# Patient Record
Sex: Female | Born: 1960 | Race: White | Hispanic: No | Marital: Married | Smoking: Never smoker
Health system: Southern US, Community
[De-identification: ages and names within clinical notes are randomized; demographics above are authoritative.]

## PROBLEM LIST (undated history)

## (undated) DIAGNOSIS — M1990 Unspecified osteoarthritis, unspecified site: Secondary | ICD-10-CM

## (undated) DIAGNOSIS — R7303 Prediabetes: Secondary | ICD-10-CM

## (undated) DIAGNOSIS — D649 Anemia, unspecified: Secondary | ICD-10-CM

## (undated) DIAGNOSIS — F329 Major depressive disorder, single episode, unspecified: Secondary | ICD-10-CM

## (undated) DIAGNOSIS — F32A Depression, unspecified: Secondary | ICD-10-CM

## (undated) DIAGNOSIS — T7840XA Allergy, unspecified, initial encounter: Secondary | ICD-10-CM

## (undated) DIAGNOSIS — R609 Edema, unspecified: Secondary | ICD-10-CM

## (undated) DIAGNOSIS — E119 Type 2 diabetes mellitus without complications: Secondary | ICD-10-CM

## (undated) DIAGNOSIS — F419 Anxiety disorder, unspecified: Secondary | ICD-10-CM

## (undated) DIAGNOSIS — K219 Gastro-esophageal reflux disease without esophagitis: Secondary | ICD-10-CM

## (undated) HISTORY — DX: Type 2 diabetes mellitus without complications: E11.9

## (undated) HISTORY — PX: TONSILLECTOMY AND ADENOIDECTOMY: SHX28

## (undated) HISTORY — DX: Prediabetes: R73.03

## (undated) HISTORY — DX: Major depressive disorder, single episode, unspecified: F32.9

## (undated) HISTORY — DX: Anxiety disorder, unspecified: F41.9

## (undated) HISTORY — DX: Allergy, unspecified, initial encounter: T78.40XA

## (undated) HISTORY — DX: Depression, unspecified: F32.A

## (undated) HISTORY — DX: Anemia, unspecified: D64.9

## (undated) HISTORY — PX: EYE SURGERY: SHX253

## (undated) HISTORY — DX: Unspecified osteoarthritis, unspecified site: M19.90

## (undated) HISTORY — DX: Edema, unspecified: R60.9

---

## 1995-08-14 DIAGNOSIS — K219 Gastro-esophageal reflux disease without esophagitis: Secondary | ICD-10-CM | POA: Insufficient documentation

## 1995-08-24 DIAGNOSIS — K589 Irritable bowel syndrome without diarrhea: Secondary | ICD-10-CM | POA: Insufficient documentation

## 1997-08-01 DIAGNOSIS — F341 Dysthymic disorder: Secondary | ICD-10-CM | POA: Insufficient documentation

## 1998-03-01 DIAGNOSIS — E66811 Obesity, class 1: Secondary | ICD-10-CM | POA: Insufficient documentation

## 1998-03-01 DIAGNOSIS — E669 Obesity, unspecified: Secondary | ICD-10-CM | POA: Insufficient documentation

## 2000-11-10 ENCOUNTER — Other Ambulatory Visit: Admission: RE | Admit: 2000-11-10 | Discharge: 2000-11-10 | Payer: Self-pay | Admitting: Family Medicine

## 2003-07-31 DIAGNOSIS — R7309 Other abnormal glucose: Secondary | ICD-10-CM | POA: Insufficient documentation

## 2004-07-06 ENCOUNTER — Ambulatory Visit: Payer: Self-pay | Admitting: Family Medicine

## 2004-10-05 ENCOUNTER — Ambulatory Visit: Payer: Self-pay | Admitting: Family Medicine

## 2004-10-20 ENCOUNTER — Ambulatory Visit: Payer: Self-pay | Admitting: Obstetrics and Gynecology

## 2004-10-27 ENCOUNTER — Ambulatory Visit: Payer: Self-pay | Admitting: Obstetrics and Gynecology

## 2004-10-30 ENCOUNTER — Ambulatory Visit: Payer: Self-pay | Admitting: Family Medicine

## 2004-11-03 ENCOUNTER — Ambulatory Visit: Payer: Self-pay | Admitting: Obstetrics and Gynecology

## 2004-11-29 ENCOUNTER — Ambulatory Visit: Payer: Self-pay | Admitting: Family Medicine

## 2004-12-30 ENCOUNTER — Ambulatory Visit: Payer: Self-pay | Admitting: Family Medicine

## 2005-01-29 ENCOUNTER — Ambulatory Visit: Payer: Self-pay | Admitting: Family Medicine

## 2005-03-01 ENCOUNTER — Ambulatory Visit: Payer: Self-pay | Admitting: Family Medicine

## 2005-04-01 ENCOUNTER — Ambulatory Visit: Payer: Self-pay | Admitting: Family Medicine

## 2005-04-29 ENCOUNTER — Ambulatory Visit: Payer: Self-pay | Admitting: Family Medicine

## 2005-05-06 ENCOUNTER — Encounter: Payer: Self-pay | Admitting: Otolaryngology

## 2005-05-30 ENCOUNTER — Ambulatory Visit: Payer: Self-pay | Admitting: Family Medicine

## 2005-05-30 ENCOUNTER — Encounter: Payer: Self-pay | Admitting: Otolaryngology

## 2005-06-29 ENCOUNTER — Ambulatory Visit: Payer: Self-pay | Admitting: Family Medicine

## 2005-12-15 ENCOUNTER — Encounter: Payer: Self-pay | Admitting: Family Medicine

## 2005-12-30 ENCOUNTER — Encounter: Payer: Self-pay | Admitting: Family Medicine

## 2006-08-01 ENCOUNTER — Ambulatory Visit: Payer: Self-pay | Admitting: Family Medicine

## 2006-12-12 ENCOUNTER — Ambulatory Visit: Payer: Self-pay | Admitting: General Practice

## 2007-03-02 HISTORY — PX: DILATION AND CURETTAGE OF UTERUS: SHX78

## 2007-03-02 HISTORY — PX: CHOLECYSTECTOMY: SHX55

## 2007-09-11 ENCOUNTER — Ambulatory Visit: Payer: Self-pay | Admitting: Gastroenterology

## 2007-09-14 ENCOUNTER — Ambulatory Visit: Payer: Self-pay | Admitting: Gastroenterology

## 2007-09-18 ENCOUNTER — Ambulatory Visit: Payer: Self-pay | Admitting: Gastroenterology

## 2007-09-20 DIAGNOSIS — Z9049 Acquired absence of other specified parts of digestive tract: Secondary | ICD-10-CM | POA: Insufficient documentation

## 2007-09-21 ENCOUNTER — Ambulatory Visit: Payer: Self-pay | Admitting: Surgery

## 2007-09-22 ENCOUNTER — Ambulatory Visit: Payer: Self-pay | Admitting: Surgery

## 2008-07-30 ENCOUNTER — Ambulatory Visit: Payer: Self-pay

## 2008-07-31 ENCOUNTER — Other Ambulatory Visit: Payer: Self-pay

## 2008-08-07 ENCOUNTER — Ambulatory Visit: Payer: Self-pay | Admitting: Family Medicine

## 2008-08-29 DIAGNOSIS — D509 Iron deficiency anemia, unspecified: Secondary | ICD-10-CM | POA: Insufficient documentation

## 2008-09-19 ENCOUNTER — Ambulatory Visit: Payer: Self-pay

## 2008-09-26 ENCOUNTER — Ambulatory Visit: Payer: Self-pay

## 2008-10-11 ENCOUNTER — Ambulatory Visit: Payer: Self-pay | Admitting: Gastroenterology

## 2008-10-11 LAB — HM COLONOSCOPY: HM COLON: NORMAL

## 2009-11-18 ENCOUNTER — Other Ambulatory Visit: Payer: Self-pay | Admitting: Physician Assistant

## 2009-12-31 ENCOUNTER — Ambulatory Visit: Payer: Self-pay

## 2010-02-09 ENCOUNTER — Other Ambulatory Visit: Payer: Self-pay | Admitting: Family Medicine

## 2010-09-23 ENCOUNTER — Ambulatory Visit: Payer: Self-pay | Admitting: Family Medicine

## 2010-10-23 ENCOUNTER — Ambulatory Visit: Payer: Self-pay | Admitting: Family Medicine

## 2010-12-14 ENCOUNTER — Ambulatory Visit: Payer: Self-pay | Admitting: General Practice

## 2011-01-04 ENCOUNTER — Ambulatory Visit: Payer: Self-pay | Admitting: General Practice

## 2011-01-12 ENCOUNTER — Emergency Department: Payer: Self-pay | Admitting: Emergency Medicine

## 2011-04-02 ENCOUNTER — Other Ambulatory Visit: Payer: Self-pay | Admitting: Family Medicine

## 2011-04-02 LAB — LIPID PANEL
Cholesterol: 177 mg/dL (ref 0–200)
HDL Cholesterol: 47 mg/dL (ref 40–60)
Ldl Cholesterol, Calc: 99 mg/dL (ref 0–100)
VLDL Cholesterol, Calc: 31 mg/dL (ref 5–40)

## 2011-04-02 LAB — COMPREHENSIVE METABOLIC PANEL
Albumin: 3.5 g/dL (ref 3.4–5.0)
Alkaline Phosphatase: 63 U/L (ref 50–136)
Anion Gap: 11 (ref 7–16)
BUN: 17 mg/dL (ref 7–18)
Calcium, Total: 8.6 mg/dL (ref 8.5–10.1)
Co2: 27 mmol/L (ref 21–32)
Creatinine: 0.61 mg/dL (ref 0.60–1.30)
EGFR (African American): >60
EGFR (Non-African Amer.): >60
Glucose: 115 mg/dL — ABNORMAL HIGH (ref 65–99)
Osmolality: 284 (ref 275–301)
Potassium: 4.1 mmol/L (ref 3.5–5.1)
SGOT(AST): 19 U/L (ref 15–37)
SGPT (ALT): 31 U/L
Sodium: 141 mmol/L (ref 136–145)

## 2011-04-02 LAB — CBC WITH DIFFERENTIAL/PLATELET
Basophil %: 0.6 %
Eosinophil %: 1.8 %
HGB: 11.3 g/dL — ABNORMAL LOW (ref 12.0–16.0)
Lymphocyte %: 17.5 %
MCHC: 33 g/dL (ref 32.0–36.0)
Monocyte %: 7.2 %
Neutrophil %: 72.9 %
Platelet: 237 10*3/uL (ref 150–440)
RBC: 4.22 10*6/uL (ref 3.80–5.20)
RDW: 14.6 % — ABNORMAL HIGH (ref 11.5–14.5)
WBC: 5.9 10*3/uL (ref 3.6–11.0)

## 2011-04-09 ENCOUNTER — Ambulatory Visit: Payer: Self-pay | Admitting: Cardiology

## 2011-11-12 ENCOUNTER — Ambulatory Visit: Payer: Self-pay | Admitting: Family Medicine

## 2012-08-03 ENCOUNTER — Other Ambulatory Visit: Payer: Self-pay

## 2012-08-03 LAB — LIPID PANEL
Cholesterol: 168 mg/dL (ref 0–200)
HDL Cholesterol: 47 mg/dL (ref 40–60)
Ldl Cholesterol, Calc: 91 mg/dL (ref 0–100)
Triglycerides: 151 mg/dL (ref 0–200)
VLDL Cholesterol, Calc: 30 mg/dL (ref 5–40)

## 2012-08-03 LAB — CBC WITH DIFFERENTIAL/PLATELET
Basophil #: 0.1 10*3/uL (ref 0.0–0.1)
Eosinophil #: 0.1 10*3/uL (ref 0.0–0.7)
Eosinophil %: 1.8 %
HCT: 39.1 % (ref 35.0–47.0)
HGB: 13.2 g/dL (ref 12.0–16.0)
Lymphocyte #: 1.4 10*3/uL (ref 1.0–3.6)
MCH: 27.7 pg (ref 26.0–34.0)
MCHC: 33.8 g/dL (ref 32.0–36.0)
Monocyte #: 0.5 x10 3/mm (ref 0.2–0.9)
Neutrophil #: 5 10*3/uL (ref 1.4–6.5)
Neutrophil %: 70.2 %
Platelet: 272 10*3/uL (ref 150–440)
RDW: 17.2 % — ABNORMAL HIGH (ref 11.5–14.5)
WBC: 7.1 10*3/uL (ref 3.6–11.0)

## 2012-08-03 LAB — COMPREHENSIVE METABOLIC PANEL
Albumin: 3.6 g/dL (ref 3.4–5.0)
Anion Gap: 6 — ABNORMAL LOW (ref 7–16)
Bilirubin,Total: 0.4 mg/dL (ref 0.2–1.0)
Calcium, Total: 9.3 mg/dL (ref 8.5–10.1)
Chloride: 101 mmol/L (ref 98–107)
Co2: 29 mmol/L (ref 21–32)
EGFR (African American): >60
EGFR (Non-African Amer.): >60
Osmolality: 273 (ref 275–301)
Potassium: 3.8 mmol/L (ref 3.5–5.1)
SGOT(AST): 23 U/L (ref 15–37)
Sodium: 136 mmol/L (ref 136–145)
Total Protein: 7.1 g/dL (ref 6.4–8.2)

## 2012-08-03 LAB — HEMOGLOBIN A1C: Hemoglobin A1C: 6.8 % — ABNORMAL HIGH (ref 4.2–6.3)

## 2012-09-19 ENCOUNTER — Other Ambulatory Visit: Payer: Self-pay

## 2012-10-02 ENCOUNTER — Other Ambulatory Visit: Payer: Self-pay | Admitting: Family Medicine

## 2012-10-02 LAB — RENAL FUNCTION PANEL
Anion Gap: 1 — ABNORMAL LOW (ref 7–16)
Calcium, Total: 8.4 mg/dL — ABNORMAL LOW (ref 8.5–10.1)
Chloride: 107 mmol/L (ref 98–107)
Co2: 31 mmol/L (ref 21–32)
Creatinine: 0.63 mg/dL (ref 0.60–1.30)
Glucose: 118 mg/dL — ABNORMAL HIGH (ref 65–99)
Phosphorus: 2.6 mg/dL (ref 2.5–4.9)
Sodium: 139 mmol/L (ref 136–145)

## 2013-07-12 LAB — HM PAP SMEAR: HM Pap smear: NORMAL

## 2013-07-12 LAB — FECAL OCCULT BLOOD, GUAIAC: Fecal Occult Blood: NEGATIVE

## 2013-07-13 ENCOUNTER — Other Ambulatory Visit: Payer: Self-pay | Admitting: Family Medicine

## 2013-07-13 LAB — CBC WITH DIFFERENTIAL/PLATELET
Basophil #: 0.1 10*3/uL (ref 0.0–0.1)
Basophil %: 1 %
Eosinophil #: 0.1 10*3/uL (ref 0.0–0.7)
Eosinophil %: 2.4 %
HCT: 42.5 % (ref 35.0–47.0)
HGB: 14.3 g/dL (ref 12.0–16.0)
Lymphocyte #: 1.4 10*3/uL (ref 1.0–3.6)
Lymphocyte %: 24.4 %
MCH: 29.8 pg (ref 26.0–34.0)
MCHC: 33.6 g/dL (ref 32.0–36.0)
MCV: 89 fL (ref 80–100)
MONO ABS: 0.4 x10 3/mm (ref 0.2–0.9)
Monocyte %: 8.1 %
Neutrophil #: 3.6 10*3/uL (ref 1.4–6.5)
Neutrophil %: 64.1 %
Platelet: 257 10*3/uL (ref 150–440)
RBC: 4.8 10*6/uL (ref 3.80–5.20)
RDW: 14.2 % (ref 11.5–14.5)
WBC: 5.6 10*3/uL (ref 3.6–11.0)

## 2013-07-13 LAB — COMPREHENSIVE METABOLIC PANEL
ALBUMIN: 3.8 g/dL (ref 3.4–5.0)
ANION GAP: 5 — AB (ref 7–16)
AST: 22 U/L (ref 15–37)
Alkaline Phosphatase: 96 U/L
BILIRUBIN TOTAL: 0.4 mg/dL (ref 0.2–1.0)
BUN: 17 mg/dL (ref 7–18)
CREATININE: 0.63 mg/dL (ref 0.60–1.30)
Calcium, Total: 9 mg/dL (ref 8.5–10.1)
Chloride: 101 mmol/L (ref 98–107)
Co2: 31 mmol/L (ref 21–32)
EGFR (Non-African Amer.): >60
GLUCOSE: 143 mg/dL — AB (ref 65–99)
Osmolality: 278 (ref 275–301)
Potassium: 4.1 mmol/L (ref 3.5–5.1)
SGPT (ALT): 42 U/L (ref 12–78)
Sodium: 137 mmol/L (ref 136–145)
Total Protein: 7.4 g/dL (ref 6.4–8.2)

## 2013-07-13 LAB — TSH: Thyroid Stimulating Horm: 1.97 u[IU]/mL

## 2013-07-13 LAB — LIPID PANEL
Cholesterol: 199 mg/dL (ref 0–200)
HDL: 43 mg/dL (ref 40–60)
Ldl Cholesterol, Calc: 114 mg/dL — ABNORMAL HIGH (ref 0–100)
Triglycerides: 210 mg/dL — ABNORMAL HIGH (ref 0–200)
VLDL Cholesterol, Calc: 42 mg/dL — ABNORMAL HIGH (ref 5–40)

## 2013-08-15 ENCOUNTER — Ambulatory Visit: Payer: Self-pay | Admitting: Family Medicine

## 2013-09-06 ENCOUNTER — Encounter: Payer: Self-pay | Admitting: Podiatry

## 2013-09-06 ENCOUNTER — Ambulatory Visit (INDEPENDENT_AMBULATORY_CARE_PROVIDER_SITE_OTHER): Payer: 59

## 2013-09-06 ENCOUNTER — Ambulatory Visit (INDEPENDENT_AMBULATORY_CARE_PROVIDER_SITE_OTHER): Payer: 59 | Admitting: Podiatry

## 2013-09-06 VITALS — BP 96/64 | HR 106 | Resp 16 | Ht 65.0 in | Wt 210.0 lb

## 2013-09-06 DIAGNOSIS — M722 Plantar fascial fibromatosis: Secondary | ICD-10-CM

## 2013-09-06 MED ORDER — METHYLPREDNISOLONE (PAK) 4 MG PO TABS: ORAL_TABLET | ORAL | Status: DC

## 2013-09-06 MED ORDER — MELOXICAM 15 MG PO TABS: 15.0000 mg | ORAL_TABLET | Freq: Every day | ORAL | Status: DC

## 2013-09-06 NOTE — Progress Notes (Signed)
She presents today with a chief complaint of bilateral heel pain. She's also complaining of painful first metatarsophalangeal joint of the right foot. States that she's had pain for the past 2-3 months and is becoming increasingly painful over time. She states that she has not been wearing the correct shoes particularly since the summertime and she does not want to wear tennis shoes all-time.  Objective: Vital signs are stable she is alert and oriented x3. She has strong palpable pulses bilateral. She has pain on palpation medial continued tubercles bilateral. She has pain on palpation and in range of motion of the first metatarsophalangeal joint right foot.  Assessment: Chronic intractable plantar fasciitis of the right foot. Mild hallux valgus deformity right foot. Plantar fasciitis of the left foot.  Plan: Discussed etiology pathology conservative surgical therapies. Put on a Sterapred Dosepak to be followed by meloxicam. Injected the bilateral heels in place the plantar fascial strapping bilaterally. I will followup with her in one month. We did discuss the need for orthotics.

## 2013-10-03 ENCOUNTER — Ambulatory Visit (INDEPENDENT_AMBULATORY_CARE_PROVIDER_SITE_OTHER): Payer: 59 | Admitting: Podiatry

## 2013-10-03 VITALS — BP 113/61 | HR 97 | Resp 16

## 2013-10-03 DIAGNOSIS — M722 Plantar fascial fibromatosis: Secondary | ICD-10-CM

## 2013-10-03 NOTE — Progress Notes (Signed)
She presents today for followup of plantar fasciitis she states that she continues her anti-inflammatories on a regular basis and continues all conservative therapies. She states that she's only proximally 50% improved.  Objective: Pulses are palpable bilateral. Vital signs are stable she is alert and oriented x3 she has minimal pain on palpation medial continued tubercles bilateral.  Assessment: Slowly resolving plantar fasciitis bilateral.  Plan: Continue all conservative therapies orthotics were scan today.

## 2013-10-25 ENCOUNTER — Encounter: Payer: Self-pay | Admitting: Podiatry

## 2013-10-25 ENCOUNTER — Ambulatory Visit (INDEPENDENT_AMBULATORY_CARE_PROVIDER_SITE_OTHER): Payer: 59 | Admitting: Podiatry

## 2013-10-25 VITALS — BP 123/62 | HR 78 | Resp 16 | Wt 207.0 lb

## 2013-10-25 DIAGNOSIS — M722 Plantar fascial fibromatosis: Secondary | ICD-10-CM

## 2013-10-25 NOTE — Progress Notes (Signed)
Pt presents for orthotic pick up, verbal and written instructions are given. She states that I think any another set of injections.  Objective: Vital signs are stable she is alert and oriented x3. She has pain on palpation medial continued tubercles bilateral.  Assessment: Plantar fasciitis chronic in nature bilateral heel.  Plan: Dispensed orthotics today she was given both oral and written home-going instructions for the care of use of the orthotics. She also received an injection to the bilateral heels today with Kenalog and local anesthetic. She will continue all conservative therapies.

## 2013-10-25 NOTE — Patient Instructions (Signed)

## 2013-11-14 ENCOUNTER — Ambulatory Visit: Payer: 59 | Admitting: Podiatry

## 2013-11-27 ENCOUNTER — Other Ambulatory Visit: Payer: Self-pay | Admitting: Family Medicine

## 2013-11-27 LAB — LIPID PANEL
Cholesterol: 207 mg/dL — ABNORMAL HIGH (ref 0–200)
HDL Cholesterol: 65 mg/dL — ABNORMAL HIGH (ref 40–60)
LDL CHOLESTEROL, CALC: 121 mg/dL — AB (ref 0–100)
TRIGLYCERIDES: 107 mg/dL (ref 0–200)
VLDL Cholesterol, Calc: 21 mg/dL (ref 5–40)

## 2013-11-27 LAB — CBC WITH DIFFERENTIAL/PLATELET
Basophil #: 0 10*3/uL (ref 0.0–0.1)
Basophil %: 0.8 %
EOS ABS: 0.1 10*3/uL (ref 0.0–0.7)
Eosinophil %: 1.4 %
HCT: 43 % (ref 35.0–47.0)
HGB: 14.2 g/dL (ref 12.0–16.0)
Lymphocyte #: 1 10*3/uL (ref 1.0–3.6)
Lymphocyte %: 18.7 %
MCH: 30.5 pg (ref 26.0–34.0)
MCHC: 33.1 g/dL (ref 32.0–36.0)
MCV: 92 fL (ref 80–100)
Monocyte #: 0.4 x10 3/mm (ref 0.2–0.9)
Monocyte %: 7 %
NEUTROS PCT: 72.1 %
Neutrophil #: 4 10*3/uL (ref 1.4–6.5)
Platelet: 259 10*3/uL (ref 150–440)
RBC: 4.67 10*6/uL (ref 3.80–5.20)
RDW: 13.9 % (ref 11.5–14.5)
WBC: 5.5 10*3/uL (ref 3.6–11.0)

## 2013-11-27 LAB — COMPREHENSIVE METABOLIC PANEL
ALBUMIN: 3.8 g/dL (ref 3.4–5.0)
ALT: 39 U/L
Alkaline Phosphatase: 91 U/L
Anion Gap: 3 — ABNORMAL LOW (ref 7–16)
BILIRUBIN TOTAL: 0.4 mg/dL (ref 0.2–1.0)
BUN: 13 mg/dL (ref 7–18)
CALCIUM: 8.8 mg/dL (ref 8.5–10.1)
CHLORIDE: 104 mmol/L (ref 98–107)
CO2: 31 mmol/L (ref 21–32)
Creatinine: 0.58 mg/dL — ABNORMAL LOW (ref 0.60–1.30)
EGFR (African American): >60
Glucose: 117 mg/dL — ABNORMAL HIGH (ref 65–99)
OSMOLALITY: 277 (ref 275–301)
POTASSIUM: 4.5 mmol/L (ref 3.5–5.1)
SGOT(AST): 27 U/L (ref 15–37)
Sodium: 138 mmol/L (ref 136–145)
Total Protein: 7.2 g/dL (ref 6.4–8.2)

## 2013-11-27 LAB — TSH: Thyroid Stimulating Horm: 1.87 u[IU]/mL

## 2013-11-29 ENCOUNTER — Other Ambulatory Visit: Payer: Self-pay | Admitting: Family Medicine

## 2014-01-21 ENCOUNTER — Encounter: Payer: Self-pay | Admitting: General Practice

## 2014-01-29 ENCOUNTER — Encounter: Payer: Self-pay | Admitting: General Practice

## 2014-03-01 ENCOUNTER — Encounter: Payer: Self-pay | Admitting: General Practice

## 2014-06-11 ENCOUNTER — Other Ambulatory Visit: Admit: 2014-06-11 | Disposition: A | Discharge status: Home / Self Care | Payer: Self-pay

## 2014-06-11 ENCOUNTER — Other Ambulatory Visit: Payer: Self-pay

## 2014-06-11 LAB — COMPREHENSIVE METABOLIC PANEL
AST: 28 U/L
Albumin: 4.2 g/dL
Alkaline Phosphatase: 93 U/L
Anion Gap: 5 — ABNORMAL LOW (ref 7–16)
BUN: 14 mg/dL
Bilirubin,Total: 0.5 mg/dL
CALCIUM: 8.8 mg/dL — AB
CHLORIDE: 105 mmol/L
CREATININE: 0.58 mg/dL
Co2: 29 mmol/L
EGFR (African American): >60
GLUCOSE: 159 mg/dL — AB
Potassium: 4.2 mmol/L
SGPT (ALT): 42 U/L
Sodium: 139 mmol/L
Total Protein: 7 g/dL

## 2014-06-11 LAB — CBC WITH DIFFERENTIAL/PLATELET
Basophil #: 0 10*3/uL (ref 0.0–0.1)
Basophil %: 1 %
Eosinophil #: 0.1 10*3/uL (ref 0.0–0.7)
Eosinophil %: 2.8 %
HCT: 40.8 % (ref 35.0–47.0)
HGB: 13.2 g/dL (ref 12.0–16.0)
LYMPHS PCT: 23 %
Lymphocyte #: 1.2 10*3/uL (ref 1.0–3.6)
MCH: 27.8 pg (ref 26.0–34.0)
MCHC: 32.4 g/dL (ref 32.0–36.0)
MCV: 86 fL (ref 80–100)
Monocyte #: 0.4 x10 3/mm (ref 0.2–0.9)
Monocyte %: 7.3 %
Neutrophil #: 3.3 10*3/uL (ref 1.4–6.5)
Neutrophil %: 65.9 %
Platelet: 259 10*3/uL (ref 150–440)
RBC: 4.75 10*6/uL (ref 3.80–5.20)
RDW: 14.1 % (ref 11.5–14.5)
WBC: 5 10*3/uL (ref 3.6–11.0)

## 2014-06-11 LAB — LIPID PANEL
Cholesterol: 210 mg/dL — ABNORMAL HIGH
HDL Cholesterol: 44 mg/dL
LDL CHOLESTEROL, CALC: 141 mg/dL — AB
Triglycerides: 127 mg/dL
VLDL CHOLESTEROL, CALC: 25 mg/dL

## 2014-06-11 LAB — TSH: THYROID STIMULATING HORM: 2.162 u[IU]/mL

## 2014-06-11 LAB — FOLATE: Folic Acid: 15.9 ng/mL

## 2014-06-11 NOTE — Patient Outreach (Signed)
Patient called to report that she had her A1C drawn and it was 6.5%.   She saw the new PA at [facility] and she has gained weight and has been started on ER Metformin (unsure of dose).  She is very unhappy with herself and her outcomes. She purchased new shoes from [employer] and wants to start exercising again.  I have asked her to start with just 5 minutes/day and progress very slowly.  She is still bothered by plantar fasciitis.  I have also encouraged her to track her food intake with the app "Lose It" and to eat breakfast which she does not do.  I have asked her to take her medication on a full stomach to decrease the potential side effects of Metformin.   Gentry Fitz, RN, BA, MHA, CDE Diabetes Coordinator Inpatient Diabetes [facility]  [phone] (Team Pager) [phone] [facility]) 06/11/2014 12:54 PM

## 2014-07-09 DIAGNOSIS — M25569 Pain in unspecified knee: Secondary | ICD-10-CM | POA: Insufficient documentation

## 2014-07-09 DIAGNOSIS — E7800 Pure hypercholesterolemia, unspecified: Secondary | ICD-10-CM | POA: Insufficient documentation

## 2014-07-09 DIAGNOSIS — F32A Depression, unspecified: Secondary | ICD-10-CM | POA: Insufficient documentation

## 2014-07-09 DIAGNOSIS — F419 Anxiety disorder, unspecified: Secondary | ICD-10-CM | POA: Insufficient documentation

## 2014-07-09 DIAGNOSIS — F5105 Insomnia due to other mental disorder: Secondary | ICD-10-CM

## 2014-07-09 DIAGNOSIS — J45909 Unspecified asthma, uncomplicated: Secondary | ICD-10-CM | POA: Insufficient documentation

## 2014-07-09 DIAGNOSIS — K649 Unspecified hemorrhoids: Secondary | ICD-10-CM | POA: Insufficient documentation

## 2014-07-09 DIAGNOSIS — R9431 Abnormal electrocardiogram [ECG] [EKG]: Secondary | ICD-10-CM

## 2014-07-09 DIAGNOSIS — R7303 Prediabetes: Secondary | ICD-10-CM | POA: Insufficient documentation

## 2014-07-09 DIAGNOSIS — F329 Major depressive disorder, single episode, unspecified: Secondary | ICD-10-CM | POA: Insufficient documentation

## 2014-07-09 DIAGNOSIS — E559 Vitamin D deficiency, unspecified: Secondary | ICD-10-CM | POA: Insufficient documentation

## 2014-07-09 DIAGNOSIS — M779 Enthesopathy, unspecified: Secondary | ICD-10-CM | POA: Insufficient documentation

## 2014-07-09 DIAGNOSIS — N841 Polyp of cervix uteri: Secondary | ICD-10-CM | POA: Insufficient documentation

## 2014-07-09 DIAGNOSIS — Z780 Asymptomatic menopausal state: Secondary | ICD-10-CM | POA: Insufficient documentation

## 2014-07-09 DIAGNOSIS — R252 Cramp and spasm: Secondary | ICD-10-CM | POA: Insufficient documentation

## 2014-07-09 DIAGNOSIS — E119 Type 2 diabetes mellitus without complications: Secondary | ICD-10-CM | POA: Insufficient documentation

## 2014-07-09 DIAGNOSIS — H6980 Other specified disorders of Eustachian tube, unspecified ear: Secondary | ICD-10-CM | POA: Insufficient documentation

## 2014-07-09 HISTORY — DX: Abnormal electrocardiogram (ECG) (EKG): R94.31

## 2014-07-22 ENCOUNTER — Encounter: Payer: Self-pay | Admitting: Podiatry

## 2014-07-22 ENCOUNTER — Ambulatory Visit (INDEPENDENT_AMBULATORY_CARE_PROVIDER_SITE_OTHER): Payer: 59 | Admitting: Podiatry

## 2014-07-22 VITALS — BP 106/43 | HR 88 | Resp 16

## 2014-07-22 DIAGNOSIS — M2011 Hallux valgus (acquired), right foot: Secondary | ICD-10-CM

## 2014-07-22 DIAGNOSIS — M722 Plantar fascial fibromatosis: Secondary | ICD-10-CM

## 2014-07-22 NOTE — Progress Notes (Signed)
She presents today for follow-up of her plantar fasciitis. She states this is been going on for so long now I just want to have this over with. She states it is starting to affect her ability to perform her daily activities. She's also complaining of right carpal tunnel pain. She denies any trauma in the past few weeks. She denies changes in her past mental history medications allergies other than starting 500 mg of metformin due to marginal diabetes.  Objective: Vital signs are stable alert and oriented 3. Pulses are strongly palpable bilateral. No changes in neurovascular status yet. She has pain on palpation medial calcaneal tubercle bilateral. She also has pain on range of motion on palpation of the first metatarsophalangeal joint of the right foot. Radiographs was reviewed and demonstrates an elongated first metatarsal resulting in hallux limitus first metatarsophalangeal joint right foot.  Assessment: Plantar fasciitis bilateral right greater than left. 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  Plan: Discussed etiology pathology conservative versus surgical therapies. Whenever a consent form today lamellae number by number giving her ample time to ask questions she sought the regarding a endoscopic plantar fasciotomy of the right heel. Also performing an Grace Isaac with osteotomy with screw fixation as well as possibly a Keller arthroplasty with a single silicone implant. We discussed the pros and cons of all of these surgeries and she understood the possible complications which may include but are not limited to postop pain bleeding swelling infection recurs need for further surgery loss of digit loss of landmarks somewhat. I answered all his questions to the best of my ability in layman's terms. We dispensed a cam walker for her. She will follow up with [place] in the near future for surgical intervention. We also injected each heel today with Kenalog and local anesthetic.

## 2014-08-19 ENCOUNTER — Telehealth: Payer: Self-pay | Admitting: *Deleted

## 2014-08-19 NOTE — Telephone Encounter (Signed)
I did receive FMLA paperwork - called and left message with patient that I have faxed paperwork to number listed on her forms.

## 2014-08-19 NOTE — Telephone Encounter (Signed)
-----   Message from Jackquline Denmark sent at 08/19/2014 10:19 AM EDT ----- Regarding: pt called with questions about fmla Contact: [phone] Pt called and is scheduled for surgery on Friday and has some questions. She also asked if we got her fmla paperwork that was faxed there. Please call pt back.

## 2014-08-21 ENCOUNTER — Other Ambulatory Visit: Payer: Self-pay | Admitting: *Deleted

## 2014-08-21 DIAGNOSIS — M79673 Pain in unspecified foot: Secondary | ICD-10-CM

## 2014-08-21 MED ORDER — CEPHALEXIN 500 MG PO CAPS: 500.0000 mg | ORAL_CAPSULE | Freq: Three times a day (TID) | ORAL | Status: DC

## 2014-08-21 MED ORDER — PROMETHAZINE HCL 25 MG PO TABS: 25.0000 mg | ORAL_TABLET | Freq: Three times a day (TID) | ORAL | Status: DC | PRN

## 2014-08-21 MED ORDER — OXYCODONE-ACETAMINOPHEN 10-325 MG PO TABS: 1.0000 | ORAL_TABLET | ORAL | Status: DC | PRN

## 2014-08-23 DIAGNOSIS — M722 Plantar fascial fibromatosis: Secondary | ICD-10-CM | POA: Diagnosis not present

## 2014-08-23 DIAGNOSIS — M2011 Hallux valgus (acquired), right foot: Secondary | ICD-10-CM | POA: Diagnosis not present

## 2014-08-28 ENCOUNTER — Ambulatory Visit (INDEPENDENT_AMBULATORY_CARE_PROVIDER_SITE_OTHER): Payer: 59

## 2014-08-28 ENCOUNTER — Encounter: Payer: Self-pay | Admitting: Podiatry

## 2014-08-28 ENCOUNTER — Ambulatory Visit (INDEPENDENT_AMBULATORY_CARE_PROVIDER_SITE_OTHER): Payer: 59 | Admitting: Podiatry

## 2014-08-28 VITALS — BP 119/66 | HR 73 | Resp 16

## 2014-08-28 DIAGNOSIS — Z9889 Other specified postprocedural states: Secondary | ICD-10-CM

## 2014-08-28 DIAGNOSIS — M722 Plantar fascial fibromatosis: Secondary | ICD-10-CM

## 2014-08-28 DIAGNOSIS — M2011 Hallux valgus (acquired), right foot: Secondary | ICD-10-CM | POA: Diagnosis not present

## 2014-08-28 NOTE — Progress Notes (Signed)
She presents today 1 week status post Miranda Huang arthroplasty with a single silicone implant and grommets with I notified been on atenolol otherwise has not been very painful.  Objective: Vital signs are stable alert and oriented 3. Pulses are strongly palpable. Neurologic sensorium is intact. Rest of dressing once removed demonstrate mild edema about the surgical site right dorsum of the foot. The right heel sutures are intact margins are well coapted with minimal edema and some ecchymosis is noted. No signs of infection. Radiographs confirm Keller arthroplasty was single silicone implant and grommets placed.  Assessment: Well-healing surgical foot right.  Plan: Redressed today with a dry sterile compressive dressing place her back in her Cam Walker and will follow-up with me in 1 week.

## 2014-09-04 ENCOUNTER — Encounter: Payer: 59 | Admitting: Podiatry

## 2014-09-06 ENCOUNTER — Ambulatory Visit (INDEPENDENT_AMBULATORY_CARE_PROVIDER_SITE_OTHER): Payer: 59 | Admitting: Podiatry

## 2014-09-06 ENCOUNTER — Encounter: Payer: Self-pay | Admitting: Podiatry

## 2014-09-06 VITALS — BP 128/87 | HR 84 | Resp 18

## 2014-09-06 DIAGNOSIS — M2011 Hallux valgus (acquired), right foot: Secondary | ICD-10-CM | POA: Diagnosis not present

## 2014-09-06 DIAGNOSIS — M722 Plantar fascial fibromatosis: Secondary | ICD-10-CM

## 2014-09-06 DIAGNOSIS — Z9889 Other specified postprocedural states: Secondary | ICD-10-CM

## 2014-09-06 NOTE — Progress Notes (Signed)
She presents today 2 weeks status post EPF right foot and a Keller arthroplasty with a single silicone implant right. She states that the top of my foot was painful last night with stabbing shooting pain however the heel seems to be doing much better. She denies fever chills nausea vomiting muscle aches and pains other than the recent pain to the dorsal aspect of the right foot.  Objective: Vital signs are stable alert and oriented 3 no erythema edema saline as drainage or odor sutures are intact and margins are well coapted. She has great range of motion of the first metatarsophalangeal joint with minimal symptoms. She is no pain on palpation medial calcaneal tubercle of the right heel.  Assessment: Well-healing surgical foot status post Keller arthroplasty right foot EPF right foot 2 weeks.  Plan: Removed all the sutures today placed her in a compression anklet and a Darco shoe. I encouraged her to utilize her Darco shoe for the next 2 weeks and I will follow up with her once I return from vacation. She will increase her range of motion daily and she may start washing her foot now.

## 2014-09-09 ENCOUNTER — Other Ambulatory Visit: Payer: Self-pay

## 2014-09-09 ENCOUNTER — Ambulatory Visit: Payer: Self-pay | Admitting: Physician Assistant

## 2014-09-11 NOTE — Patient Outreach (Signed)
[facility] Care Management  09/11/2014  Miranda Huang Dec 10, 1960 [id number]   Spoke to patient by phone- she had plantar fasciitis surgery on her right foot, 2 weeks ago and is home recuperating. She will be home and off her foot for 1 month in total.  She is having sporadic pain which she uses Naprosyn to manage.  She is sporadically checking blood sugars - fasting blood sugars 140-150mg /dl.  She is seeing her family doctor on Friday, July15, 2016.    Miranda Huang reports that she takes 5mg  HCTZ and 5mg  Lasix- please review her medications at her next visit. She is taking her Metformin as ordered.   When Miranda Huang is feeling better and her MD has cleared her for exercise, she will need to figure out where to fit it in as a means of managing blood sugars, managing weight and as a way to manage stress.  Her care will be transferred to the pharmacist for her diabetes care.    Gentry Fitz, RN, BA, [place], [place] Direct Dial:  [phone]  Fax:  [phone] E-mail: [email or web address] [address] [place], [place]  [address]

## 2014-09-13 ENCOUNTER — Ambulatory Visit: Payer: Self-pay | Admitting: Physician Assistant

## 2014-09-16 ENCOUNTER — Encounter: Payer: 59 | Admitting: Podiatry

## 2014-09-25 ENCOUNTER — Encounter: Payer: Self-pay | Admitting: Podiatry

## 2014-09-25 ENCOUNTER — Ambulatory Visit (INDEPENDENT_AMBULATORY_CARE_PROVIDER_SITE_OTHER): Payer: 59

## 2014-09-25 ENCOUNTER — Ambulatory Visit (INDEPENDENT_AMBULATORY_CARE_PROVIDER_SITE_OTHER): Payer: 59 | Admitting: Podiatry

## 2014-09-25 VITALS — BP 94/65 | HR 95 | Resp 16

## 2014-09-25 DIAGNOSIS — Z9889 Other specified postprocedural states: Secondary | ICD-10-CM | POA: Diagnosis not present

## 2014-09-25 DIAGNOSIS — M2011 Hallux valgus (acquired), right foot: Secondary | ICD-10-CM

## 2014-09-25 NOTE — Progress Notes (Signed)
She presents today one month status post Keller arthroplasty with single silicone implant and an endoscopic plantar fasciotomy right foot. She states that is a little sore sometimes but she seems to be getting better all the time. She still has some mild tenderness on palpation of the first metatarsophalangeal joint right.  Vital signs are stable she is alert and oriented 3 there is no erythema incision is gone on to heal very nicely she has mild tenderness on palpation of the medial calcaneal tubercle. She has some tenderness on the contralateral foot at the medial calcaneal tubercle.  Assessment: Well-healing surgical foot right probable plantar fasciitis left.  Plan: Encourage range of motion exercises I also encouraged her to get back into her regular shoe gear and she will follow-up with me in 2 weeks. She may call earlier his long as she can get around in her shoes well to shorten her time and get back to work at an earlier date.

## 2014-09-30 ENCOUNTER — Ambulatory Visit: Payer: 59 | Admitting: Podiatry

## 2014-09-30 ENCOUNTER — Encounter: Payer: Self-pay | Admitting: Podiatry

## 2014-10-07 ENCOUNTER — Ambulatory Visit (INDEPENDENT_AMBULATORY_CARE_PROVIDER_SITE_OTHER): Payer: 59

## 2014-10-07 ENCOUNTER — Ambulatory Visit (INDEPENDENT_AMBULATORY_CARE_PROVIDER_SITE_OTHER): Payer: 59 | Admitting: Podiatry

## 2014-10-07 ENCOUNTER — Encounter: Payer: Self-pay | Admitting: Podiatry

## 2014-10-07 ENCOUNTER — Encounter: Payer: Self-pay | Admitting: *Deleted

## 2014-10-07 VITALS — BP 119/63 | HR 82 | Resp 16

## 2014-10-07 DIAGNOSIS — Z9889 Other specified postprocedural states: Secondary | ICD-10-CM | POA: Diagnosis not present

## 2014-10-07 DIAGNOSIS — M205X1 Other deformities of toe(s) (acquired), right foot: Secondary | ICD-10-CM

## 2014-10-07 DIAGNOSIS — M722 Plantar fascial fibromatosis: Secondary | ICD-10-CM

## 2014-10-07 MED ORDER — METHYLPREDNISOLONE 4 MG PO TBPK: ORAL_TABLET | ORAL | Status: DC

## 2014-10-07 MED ORDER — MELOXICAM 15 MG PO TABS: 15.0000 mg | ORAL_TABLET | Freq: Every day | ORAL | Status: DC

## 2014-10-07 NOTE — Progress Notes (Signed)
She presents today for follow-up of a Keller arthroplasty with a single silicone implant and EPF right foot. She's also complaining of pain to the left foot. She states that she did a lot of walking at the beach over the past weekend or so and she says man my foot was just about kill me.  Objective: Her signs are stable she is alert and oriented 3. Pulses are palpable bilateral. Neurologic sensorium is intact. Great range of motion of the first metatarsophalangeal joint. She still has mild tenderness on palpation of the EPF site right foot. An pain on palpation medial calcaneal tubercle of the left heel.  Assessment: Status post Keller arthroplasty right foot 100% resolved. Endoscopic plantar fasciotomy right heel still painful. Plantar fasciitis left heel still painful.  Plan: Started her on a Medrol Dosepak. Offered to send her to physical therapy which she declined. I will follow up with her in 3-4 weeks.

## 2014-10-09 ENCOUNTER — Other Ambulatory Visit: Payer: Self-pay

## 2014-10-09 MED ORDER — ESCITALOPRAM OXALATE 10 MG PO TABS: 10.0000 mg | ORAL_TABLET | Freq: Every day | ORAL | Status: DC

## 2014-10-24 ENCOUNTER — Other Ambulatory Visit: Payer: Self-pay | Admitting: Family Medicine

## 2014-10-24 DIAGNOSIS — Z1231 Encounter for screening mammogram for malignant neoplasm of breast: Secondary | ICD-10-CM

## 2014-10-28 ENCOUNTER — Ambulatory Visit (INDEPENDENT_AMBULATORY_CARE_PROVIDER_SITE_OTHER): Payer: 59 | Admitting: Podiatry

## 2014-10-28 ENCOUNTER — Encounter: Payer: Self-pay | Admitting: Podiatry

## 2014-10-28 ENCOUNTER — Ambulatory Visit (INDEPENDENT_AMBULATORY_CARE_PROVIDER_SITE_OTHER): Payer: 59

## 2014-10-28 VITALS — BP 111/66 | HR 82 | Resp 12

## 2014-10-28 DIAGNOSIS — Z9889 Other specified postprocedural states: Secondary | ICD-10-CM

## 2014-10-28 DIAGNOSIS — R52 Pain, unspecified: Secondary | ICD-10-CM | POA: Diagnosis not present

## 2014-10-28 DIAGNOSIS — M722 Plantar fascial fibromatosis: Secondary | ICD-10-CM

## 2014-10-28 DIAGNOSIS — M205X1 Other deformities of toe(s) (acquired), right foot: Secondary | ICD-10-CM

## 2014-10-28 NOTE — Progress Notes (Signed)
She presents today date of surgery 08/23/2014 status post Jake Michaelis arthroplasty and an EPF right foot. She states the right foot seems to be getting worse it is swollen and painful. She denies fever chills nausea vomiting muscle aches and pains. Denies any trauma to the foot. Denies being on the foot for long periods of time.  Objective: Vital signs are stable she is alert and oriented 3 pulses are strongly palpable no calf pain there is no warmth in the calf and no warmth in the dorsal aspect of the foot that she does have pitting edema to the dorsal aspect of the foot. She has great range of motion of the first metatarsophalangeal joint and no pain on palpation of the medial calcaneal tubercle at the surgical site itself. Cutaneous evaluation of a straight supple well-hydrated cutis no erythema cellulitis drainage or odor. No open wounds. Radiographs confirm well-healing surgical foot 3 views taken of the right foot today.  Assessment: Edema and pain to the right foot status post Keller arthroplasty in a single silicone implant right foot with an EPF.  Plan: At this point I encouraged her to perform can at home physical therapy and recommended formal physical therapy at [facility]

## 2014-10-29 ENCOUNTER — Other Ambulatory Visit: Payer: Self-pay | Admitting: Family Medicine

## 2014-10-29 ENCOUNTER — Ambulatory Visit
Admission: RE | Admit: 2014-10-29 | Discharge: 2014-10-29 | Disposition: A | Discharge status: Home / Self Care | Payer: 59 | Source: Ambulatory Visit | Attending: Family Medicine | Admitting: Family Medicine

## 2014-10-29 DIAGNOSIS — Z1231 Encounter for screening mammogram for malignant neoplasm of breast: Secondary | ICD-10-CM

## 2014-10-30 ENCOUNTER — Ambulatory Visit: Payer: 59 | Attending: Podiatry

## 2014-10-30 DIAGNOSIS — M25571 Pain in right ankle and joints of right foot: Secondary | ICD-10-CM | POA: Diagnosis present

## 2014-10-30 DIAGNOSIS — R29898 Other symptoms and signs involving the musculoskeletal system: Secondary | ICD-10-CM | POA: Diagnosis present

## 2014-10-30 DIAGNOSIS — M25673 Stiffness of unspecified ankle, not elsewhere classified: Secondary | ICD-10-CM

## 2014-10-30 NOTE — Patient Instructions (Signed)
Achilles / Soleus, Standing   Stand, right foot behind, heel on floor. Lower hips and bend knees. Hold _30__ seconds. Repeat _3__ times per session. Do _3__ sessions per day. Repeat on the opposite side   Toe Flexion: Stretch - Extensors   Position Helper: Hold right foot securely under heel. Motion - Helper presses large toe gently toward sole of foot. -Do not allow foot to twist. -Stop at point of tension in muscle or joint. Hold _30__ seconds. Repeat _3__ times. Do _2-3__ sessions per day. Variation:    Foot Arch Stretch: Plantar Fascia, Sitting   Sit on edge of chair. Place foot on top of roll and gently roll foot forward and backward. Feel stretch in arch of foot. Roll for _3-5_ minutes. Can use tennis ball or frozen water bottle (for numbing effect) Repeat _1__ times per session. Perform multiple times throughout the day (at least 2-3).

## 2014-10-30 NOTE — Therapy (Signed)
[place] MAIN [facility] SERVICES [address] [place], [place], [address] Phone: [phone]   Fax:  [phone]  Physical Therapy Evaluation  Patient Details  Name: Miranda Huang MRN: [id number] Date of Birth: [birth year] Referring Provider:  Garrel Ridgel, [place]  Encounter Date: 54/31/2016      PT End of Session - 54/31/16 1513    Visit Number 1   Number of Visits 9   Date for PT Re-Evaluation 11/29/14   Authorization Type no g codes   PT Start Time 5361   PT Stop Time 1420   PT Time Calculation (min) 75 min   Activity Tolerance Patient tolerated treatment well   Behavior During Therapy [facility] for tasks assessed/performed      Past Medical History  Diagnosis Date  . Anxiety   . Depression   . Swelling   . Pre-diabetes     Past Surgical History  Procedure Laterality Date  . Cholecystectomy  2009  . Cesarean section  54 and 1990  . Dilation and curettage of uterus  2009  . Tonsillectomy and adenoidectomy      There were no vitals filed for this visit.  Visit Diagnosis:  Pain in joint, ankle and foot, right - Plan: PT plan of care cert/re-cert  Decreased range of motion of ankle - Plan: PT plan of care cert/re-cert      Subjective Assessment - 54/31/16 1444    Subjective R foot pain and swelling   Pertinent History Pt with history of chronic plantar fasciitis (greater than 54 years). She has seen a podiatrist and would receive steroid injections as well as oral steroids. She tried physical therapy many years ago with benefit but experienced continued recurrence of symptoms. Pt had custom orthotics made approximately 1 year ago which helped but she continued to have flare-ups of pain. Pt eventually decided to have surgical intervention and on 08/23/14 patient underwent Keller arthroplasty with silicone implant for R first MTP pain and EPF for chronic plantar fasciitis. Pt reports she was in a short leg walker for 54 weeks but was full  weight bearing. She was transitioned to a post-op shoe and then to her regular sneakers. Pt was advised to start AROM of R ankle and toes. Pt wears compression stockings during the day and a plantar-fasciitis splint at night. Pt complains of continued swelling as well as pain over R dorsum (1st MTP and lateral forefoot) and medial ventral foot along plantar fascia near medial calcaneal tuberosity. Pain is worse currently than it was immediately after surgery. Aggravating factors include standing still, AROM of right foot, and occasional spontaneous pain. Easing factors include steroids, Meloxicam (minimal relief), ice, and compression stockings. She describes her pain as aching and sharp. Pain is constant throughout the day. Worst: 8/10; Best: 3/10; Present: 8/10. Pain does not wake her up at night. Pt reports that she tries to wear shoes at all time and not walk barefoot. Pt reports she was recently placed on a prednisone taper (finished 2-3 weeks ago) which helped a little. ROS negative for red. Denies numbness/tingling currently in R foot.    Patient Stated Goals "I want my pain to go away"   Currently in Pain? Yes   Pain Score 8    Pain Location Foot   Pain Orientation Right   Pain Descriptors / Indicators Aching   Pain Type Surgical pain  chronic plantar fascia pain   Pain Onset More than a month ago   Multiple  Pain Sites Yes  History of L plantar fascia pain            [facility] PT Assessment - 54/31/16 0001    Assessment   Medical Diagnosis Keller arthroplasty with silicone implant EPF with post-op swelling and pain   Onset Date/Surgical Date 08/23/14   Next MD Visit Not provided   Prior Therapy Remote history of physical therapy   Precautions   Precautions None   Restrictions   Weight Bearing Restrictions No   Home Environment   Living Environment Other (Comment)  No details provided by patient   Prior Function   Level of Independence Independent   Vocation Full time employment    Cognition   Overall Cognitive Status Within Functional Limits for tasks assessed   Observation/Other Assessments   Observations Mild swelling and definite redness noted in R foot. Well healed scars over first MTP and medial calcaneal tuberosity. No pitting edema noted. Mild bilateral knee hyperextension in standing with mild increase in R knee valgus in standing   Sensation   Light Touch Appears Intact  Denies numbness/tingling with light touch L1-S2   Proprioception Appears Intact  Great toe   Coordination   Gross Motor Movements are Fluid and Coordinated Yes   ROM / Strength   AROM / PROM / Strength Strength;AROM   AROM   Overall AROM  Deficits   Overall AROM Comments Hip, and knee AROM appears grossly WNL.    AROM Assessment Site Ankle   Right/Left Ankle Right;Left   Right Ankle Dorsiflexion 22  Measured in standing (weightbearing).   Right Ankle Plantar Flexion 64   Right Ankle Inversion 35  Remote history of chronic R ankle sprains   Right Ankle Eversion 20   Left Ankle Dorsiflexion 25   Left Ankle Plantar Flexion 65   Left Ankle Inversion 25   Left Ankle Eversion 22   Strength   Overall Strength Comments At least 4+ to 5/5 bilateral hip and knees. 5/5 bilateral ankle dorsiflexion, inversion, and eversion. Decreased R plantarflexion strength noted with heel raise in weightbearing due to both weakness and pain. 5/5 and painless R great toe flexion and extension strength. Good PROM great toe flexion/extension with painless extension and painful flexion for pulling on dorusm of first MTP.    Flexibility   Soft Tissue Assessment /Muscle Length yes  Gastroc and soleus length limited bilaterally   Palpation   Palpation comment Tenderness to palpation on dorsum of foot over first MTP as well lateral R foot and medial ventral aspect of foot along medial fibers of plantar fascia as well as near medial calcaneal tuberosity. Pain with passive accessory motions of midfoot with decreased  mobility noted. Good metatarsal and first MTP mobility noted. Good talocrural AP mobility noted. Pain over peroneals posterior to lateral malleolus and extending superior into lateral leg. No pain at  base of fifth metatarsal.    Ambulation/Gait   Gait Comments Full gait evaluation to be performed at next visit   Balance   Balance Assessed --  Deferred as necessary at next visit.       Manual Therapy: Performed IASTM with Edge tool to plantar fascia of R foot (focus on medial portion) as well as peroneal tendons starting posterior to lateral malleolus and working superiorly along lateral leg. CP applied to R foot x 10 minutes (unbilled). Kinesiotaping applied for plantar fasciitis (3 pieces, one along plantar fascia extening up posterior calf, one medial to lateral, and one medial lateral along an  oblique angle). Pt reports improvement in pain following session.                     PT Education - 10/30/14 1512    Education provided Yes   Education Details HEP provided, education about RICE, education about prognosis   Person(s) Educated Patient   Methods Explanation;Demonstration;Handout   Comprehension Verbalized understanding             PT Long Term Goals - 10/30/14 1540    PT LONG TERM GOAL #1   Title Pt will be independent with HEP for management of R foot swelling and pain by 9//30/16   Status New   PT LONG TERM GOAL #2   Title Pt will report worst pain as 5/10 on NPRS with aggravating activities by 11/02/14   Baseline 10/30/14: Worst 8/10   Status New   PT LONG TERM GOAL #3   Title Pt will demonstrate improvement in R ankle dorsiflexion to at least 25 degrees (in WB) so that it is symmetrical with R side in order ot decrease stress on plantar fasica by 11/02/14   Baseline 10/30/14: 22 degrees   Status New               Plan - 10/30/14 1514    Clinical Impression Statement Pt is a pleasant 54 year-old Caucasian female who underwent Keller arthroplasty  with silicone implant for R first MTP pain and EPF for chronic plantar fasciitis. She has had a long struggle with bilateral plantar fasciitis but worse on R foot. Pt arrives complaining of considerable pain and swelling in R foot. PT evaluation reveals mild swelling and redness of R foot. Good mobility and strength of first MTP reporting pain on dorsum of first MTP joint with passive great toe flexion. Pt also with pain along peroneals posterior to lateral malleolus and extending superiorly into lateral R leg. Good metatarsal moblity with some immobility of midfoot with passive accessory motion testing. Pt with pain to palpation over medial and lateral dorsum of R foot as well as ventral medial R foot along fibers of plantar fasica. Decreased dorsiflexion PROM bilaterally but worse on R than L. Pt has adopted most strategies for self-management of plantarfascia pain including icing, night splinting, orthotics, and compression hose (for swelling). Pt was provided with HEP which included myofascial release rolling over tennis ball/frozen water bottle as well as soleus stretches and passive R first MTP stretch. Pt will benefit from skilled PT services to address deficits in pain and swelling in order to improve mobility and decrease pain.     Pt will benefit from skilled therapeutic intervention in order to improve on the following deficits Pain;Hypomobility   Rehab Potential Good   Clinical Impairments Affecting Rehab Potential Positive: motivation, Negative: chronicity   PT Frequency 2x / week   PT Duration 4 weeks   PT Treatment/Interventions Aquatic Therapy;Electrical Stimulation;Iontophoresis 4mg /ml Dexamethasone;Moist Heat;Ultrasound;Contrast Bath;Gait training;Therapeutic activities;Therapeutic exercise;Neuromuscular re-education;Manual techniques;Scar mobilization;Passive range of motion   PT Next Visit Plan Complete LEFS for goals, assess gait, progress manual therapy including STM of plantar fascia  and peroneal tendons, midfoot mobilizations, pain-relieving modalities,    PT Home Exercise Plan Plantar fascia rolling with frozen water bottle/tennis ball, standing soleus stretches, 1st MTP flexion stretch   Consulted and Agree with Plan of Care Patient         Problem List Patient Active Problem List   Diagnosis Date Noted  . Absence of menstruation 07/09/2014  . Anxiety,  generalized 07/09/2014  . Clinical depression 07/09/2014  . Diabetes mellitus, type 2 07/09/2014  . Dysfunction of eustachian tube 07/09/2014  . Hemorrhoid 07/09/2014  . Hypercholesteremia 07/09/2014  . Gonalgia 07/09/2014  . Cramps of lower extremity 07/09/2014  . Nonspecific ST-T changes 07/09/2014  . Cervical polyp 07/09/2014  . Borderline diabetes 07/09/2014  . RAD (reactive airway disease) 07/09/2014  . Tendinitis 07/09/2014  . Avitaminosis D 07/09/2014  . Absolute anemia 08/29/2008  . Cholecystitis, chronic 09/20/2007  . Abnormal blood sugar 07/31/2003  . Adiposity 03/01/1998  . Depression, neurotic 08/01/1997  . Adaptive colitis 08/24/1995  . Acid reflux 08/14/1995   Phillips Grout PT, DPT   Huprich,Jason 10/30/2014, 3:52 PM  [place] MAIN [facility] SERVICES [address] [place], [place], [address] Phone: [phone]   Fax:  [phone]

## 2014-11-05 ENCOUNTER — Ambulatory Visit: Payer: 59 | Attending: Podiatry | Admitting: Physical Therapy

## 2014-11-05 ENCOUNTER — Encounter: Payer: Self-pay | Admitting: Physical Therapy

## 2014-11-05 DIAGNOSIS — M25571 Pain in right ankle and joints of right foot: Secondary | ICD-10-CM | POA: Insufficient documentation

## 2014-11-05 DIAGNOSIS — R29898 Other symptoms and signs involving the musculoskeletal system: Secondary | ICD-10-CM | POA: Diagnosis present

## 2014-11-05 DIAGNOSIS — M25673 Stiffness of unspecified ankle, not elsewhere classified: Secondary | ICD-10-CM

## 2014-11-05 NOTE — Therapy (Signed)
[place] MAIN [facility] SERVICES [address] [place], [place], [address] Phone: [phone]   Fax:  [phone]  Physical Therapy Treatment  Patient Details  Name: Miranda Huang MRN: [id number] Date of Birth: 12-31-1960 Referring Provider:  Garrel Ridgel, [place]  Encounter Date: 11/05/2014      PT End of Session - 11/05/14 1704    Visit Number 2   Number of Visits 9   Date for PT Re-Evaluation 11/29/14   Authorization Type no g codes   PT Start Time 0445   PT Stop Time 0525   PT Time Calculation (min) 40 min   Activity Tolerance Patient tolerated treatment well   Behavior During Therapy [facility] for tasks assessed/performed      Past Medical History  Diagnosis Date  . Anxiety   . Depression   . Swelling   . Pre-diabetes     Past Surgical History  Procedure Laterality Date  . Cholecystectomy  2009  . Cesarean section  1985 and 1990  . Dilation and curettage of uterus  2009  . Tonsillectomy and adenoidectomy      There were no vitals filed for this visit.  Visit Diagnosis:  Pain in joint, ankle and foot, right  Decreased range of motion of ankle      Subjective Assessment - 11/05/14 1701    Subjective R foot pain and swelling, patient is not having any pain or discomfort today and is wearing her shoes.    Pertinent History Pt with history of chronic plantar fasciitis (greater than 4 years). She has seen a podiatrist and would receive steroid injections as well as oral steroids. She tried physical therapy many years ago with benefit but experienced continued recurrence of symptoms. Pt had custom orthotics made approximately 1 year ago which helped but she continued to have flare-ups of pain. Pt eventually decided to have surgical intervention and on 08/23/14 patient underwent Keller arthroplasty with silicone implant for R first MTP pain and EPF for chronic plantar fasciitis. Pt reports she was in a short leg walker for 4 weeks but was  full weight bearing. She was transitioned to a post-op shoe and then to her regular sneakers. Pt was advised to start AROM of R ankle and toes. Pt wears compression stockings during the day and a plantar-fasciitis splint at night. Pt complains of continued swelling as well as pain over R dorsum (1st MTP and lateral forefoot) and medial ventral foot along plantar fascia near medial calcaneal tuberosity. Pain is worse currently than it was immediately after surgery. Aggravating factors include standing still, AROM of right foot, and occasional spontaneous pain. Easing factors include steroids, Meloxicam (minimal relief), ice, and compression stockings. She describes her pain as aching and sharp. Pain is constant throughout the day. Worst: 8/10; Best: 3/10; Present: 8/10. Pain does not wake her up at night. Pt reports that she tries to wear shoes at all time and not walk barefoot. Pt reports she was recently placed on a prednisone taper (finished 2-3 weeks ago) which helped a little. ROS negative for red. Denies numbness/tingling currently in R foot.    Patient Stated Goals "I want my pain to go away"   Pain Onset More than a month ago         Manual therapy: STM and edge tool to right  plantar foot: anterior foot, and lateral foot with some tenderness that got better with STM. Patient has some tenderness to top of foot and lateral  foot in beginning of treatment and no tenderness following treatment.  Reviewed HEP to right LE ankle. Reviewed importance of ice and wearing her shoes.  ROM is improving to R great toe with less discomfort in flex of R great toe.                        PT Education - 11/05/14 1704    Education provided Yes   Person(s) Educated Patient   Methods Explanation   Comprehension Verbalized understanding             PT Long Term Goals - 10/30/14 1540    PT LONG TERM GOAL #1   Title Pt will be independent with HEP for management of R foot swelling  and pain by 9//30/16   Status New   PT LONG TERM GOAL #2   Title Pt will report worst pain as 5/10 on NPRS with aggravating activities by 11/02/14   Baseline 10/30/14: Worst 8/10   Status New   PT LONG TERM GOAL #3   Title Pt will demonstrate improvement in R ankle dorsiflexion to at least 25 degrees (in WB) so that it is symmetrical with R side in order ot decrease stress on plantar fasica by 11/02/14   Baseline 10/30/14: 22 degrees   Status New               Plan - 11/05/14 1705    Clinical Impression Statement Patient tolerates STM to R foot and is able to ambulate wihtout limp with slow gait speed.    Pt will benefit from skilled therapeutic intervention in order to improve on the following deficits Pain;Hypomobility   Rehab Potential Good   Clinical Impairments Affecting Rehab Potential Positive: motivation, Negative: chronicity   PT Frequency 2x / week   PT Duration 4 weeks   PT Treatment/Interventions Aquatic Therapy;Electrical Stimulation;Iontophoresis 4mg /ml Dexamethasone;Moist Heat;Ultrasound;Contrast Bath;Gait training;Therapeutic activities;Therapeutic exercise;Neuromuscular re-education;Manual techniques;Scar mobilization;Passive range of motion   PT Next Visit Plan Complete LEFS for goals, assess gait, progress manual therapy including STM of plantar fascia and peroneal tendons, midfoot mobilizations, pain-relieving modalities,    PT Home Exercise Plan Plantar fascia rolling with frozen water bottle/tennis ball, standing soleus stretches, 1st MTP flexion stretch   Consulted and Agree with Plan of Care Patient        Problem List Patient Active Problem List   Diagnosis Date Noted  . Absence of menstruation 07/09/2014  . Anxiety, generalized 07/09/2014  . Clinical depression 07/09/2014  . Diabetes mellitus, type 2 07/09/2014  . Dysfunction of eustachian tube 07/09/2014  . Hemorrhoid 07/09/2014  . Hypercholesteremia 07/09/2014  . Gonalgia 07/09/2014  . Cramps of  lower extremity 07/09/2014  . Nonspecific ST-T changes 07/09/2014  . Cervical polyp 07/09/2014  . Borderline diabetes 07/09/2014  . RAD (reactive airway disease) 07/09/2014  . Tendinitis 07/09/2014  . Avitaminosis D 07/09/2014  . Absolute anemia 08/29/2008  . Cholecystitis, chronic 09/20/2007  . Abnormal blood sugar 07/31/2003  . Adiposity 03/01/1998  . Depression, neurotic 08/01/1997  . Adaptive colitis 08/24/1995  . Acid reflux 08/14/1995    Alanson Puls 11/05/2014, 5:39 PM  [place] MAIN [facility] SERVICES [address] [place], [place], [address] Phone: [phone]   Fax:  [phone]

## 2014-11-06 ENCOUNTER — Encounter: Payer: 59 | Admitting: Podiatry

## 2014-11-06 ENCOUNTER — Ambulatory Visit: Payer: Self-pay

## 2014-11-06 DIAGNOSIS — M722 Plantar fascial fibromatosis: Secondary | ICD-10-CM

## 2014-11-06 DIAGNOSIS — Z9889 Other specified postprocedural states: Secondary | ICD-10-CM

## 2014-11-06 DIAGNOSIS — M205X1 Other deformities of toe(s) (acquired), right foot: Secondary | ICD-10-CM

## 2014-11-07 ENCOUNTER — Encounter: Payer: Self-pay | Admitting: Physical Therapy

## 2014-11-07 ENCOUNTER — Ambulatory Visit: Payer: 59 | Admitting: Physical Therapy

## 2014-11-07 DIAGNOSIS — M25571 Pain in right ankle and joints of right foot: Secondary | ICD-10-CM

## 2014-11-07 DIAGNOSIS — M25673 Stiffness of unspecified ankle, not elsewhere classified: Secondary | ICD-10-CM

## 2014-11-07 NOTE — Therapy (Signed)
[place] MAIN [facility] SERVICES [address] [place], [place], [address] Phone: [phone]   Fax:  [phone]  Physical Therapy Treatment  Patient Details  Name: Miranda Huang MRN: [id number] Date of Birth: 12/05/1960 Referring Provider:  Garrel Ridgel, [place]  Encounter Date: 11/07/2014      PT End of Session - 11/07/14 1403    Visit Number 3   Number of Visits 9   Date for PT Re-Evaluation 11/29/14   Authorization Type no g codes   PT Start Time 1350   PT Stop Time 1432   PT Time Calculation (min) 42 min   Activity Tolerance Patient tolerated treatment well   Behavior During Therapy [facility] for tasks assessed/performed      Past Medical History  Diagnosis Date  . Anxiety   . Depression   . Swelling   . Pre-diabetes     Past Surgical History  Procedure Laterality Date  . Cholecystectomy  2009  . Cesarean section  1985 and 1990  . Dilation and curettage of uterus  2009  . Tonsillectomy and adenoidectomy      There were no vitals filed for this visit.  Visit Diagnosis:  Pain in joint, ankle and foot, right  Decreased range of motion of ankle      Subjective Assessment - 11/07/14 1400    Subjective Patient reports she is in a little pain on this day 5/10 in the dorsal and volar aspects of the foot. She states that the swelling has reduced significantly since last PT visit.    Pertinent History Pt with history of chronic plantar fasciitis (greater than 4 years). She has seen a podiatrist and would receive steroid injections as well as oral steroids. She tried physical therapy many years ago with benefit but experienced continued recurrence of symptoms. Pt had custom orthotics made approximately 1 year ago which helped but she continued to have flare-ups of pain. Pt eventually decided to have surgical intervention and on 08/23/14 patient underwent Keller arthroplasty with silicone implant for R first MTP pain and EPF for chronic plantar  fasciitis. Pt reports she was in a short leg walker for 4 weeks but was full weight bearing. She was transitioned to a post-op shoe and then to her regular sneakers. Pt was advised to start AROM of R ankle and toes. Pt wears compression stockings during the day and a plantar-fasciitis splint at night. Pt complains of continued swelling as well as pain over R dorsum (1st MTP and lateral forefoot) and medial ventral foot along plantar fascia near medial calcaneal tuberosity. Pain is worse currently than it was immediately after surgery. Aggravating factors include standing still, AROM of right foot, and occasional spontaneous pain. Easing factors include steroids, Meloxicam (minimal relief), ice, and compression stockings. She describes her pain as aching and sharp. Pain is constant throughout the day. Worst: 8/10; Best: 3/10; Present: 8/10. Pain does not wake her up at night. Pt reports that she tries to wear shoes at all time and not walk barefoot. Pt reports she was recently placed on a prednisone taper (finished 2-3 weeks ago) which helped a little. ROS negative for red. Denies numbness/tingling currently in R foot.    Patient Stated Goals "I want my pain to go away"   Currently in Pain? Yes   Pain Score 5    Pain Location Foot   Pain Orientation Right   Pain Descriptors / Indicators Aching   Pain Type Surgical pain  Pain Onset More than a month ago        Treatment:  Soleus stretch 3x20 seconds Gastroc stretch 3x20 seconds  PT provided min verbal and visual instruction for proper stretching technique and set up. Patient reports increased stretch with corrected LE positioning.   Therex:  Long sitting with red tband  R LE inversion 2x 10  R LE eversion 2x10  R LE DF 2x10 R LE PF 2x10  Min verbal instruction provided by PT to decrease speed of movement to increase eccentric control. Patient responded well to instruction.     Manual Therapy: PT performed grade II posterior mobilizations  to the R ankle 2 bouts of 30 seconds   PT performed Soft tissue massage  (STM) with ASTYM Edge tool to plantar fascia of R foot with concentration on the medial aspect x 10 minutes  STM also performed at the peroneal tendons starting posterior/distal to lateral malleolus and working superiorly along lateral leg. X 7 minutes  Kinesiotaping applied for plantar fasciitis (2 pieces, one along plantar fascia extending from the calcaneus to the metatarsal heads, one lateral to medial along the arch of the foot).   Pt reports decreased pain following session.                    PT Education - 11/07/14 1638    Education provided Yes   Education Details manual therapy; ankle strengthening.    Person(s) Educated Patient   Methods Explanation;Verbal cues   Comprehension Verbalized understanding;Verbal cues required             PT Long Term Goals - 10/30/14 1540    PT LONG TERM GOAL #1   Title Pt will be independent with HEP for management of R foot swelling and pain by 9//30/16   Status New   PT LONG TERM GOAL #2   Title Pt will report worst pain as 5/10 on NPRS with aggravating activities by 11/02/14   Baseline 10/30/14: Worst 8/10   Status New   PT LONG TERM GOAL #3   Title Pt will demonstrate improvement in R ankle dorsiflexion to at least 25 degrees (in WB) so that it is symmetrical with R side in order ot decrease stress on plantar fasica by 11/02/14   Baseline 10/30/14: 22 degrees   Status New               Plan - 11/07/14 1639    Clinical Impression Statement Patient instructed in stretching and ankle strengthening. Min Verbal instruction required for proper exercise set up and positioning. Instruction also required to decrease speed of eccentric movements to increase strengthening. Patient tolerated STM well to the R plantar surface of the foot with a concentration on the medial aspect. Patient reports decreased pain from 5/10 to 3/10 upon completion of manual  therapy. PT also applied KinesioTape to the plantar aspect for the foot upon completion of manual therapy.  Continued skilled PT is recommended to decrease pain, improve gait and increase function with daily activities.    Pt will benefit from skilled therapeutic intervention in order to improve on the following deficits Pain;Hypomobility   Rehab Potential Good   Clinical Impairments Affecting Rehab Potential Positive: motivation, Negative: chronicity   PT Frequency 2x / week   PT Duration 4 weeks   PT Treatment/Interventions Aquatic Therapy;Electrical Stimulation;Iontophoresis 4mg /ml Dexamethasone;Moist Heat;Ultrasound;Contrast Bath;Gait training;Therapeutic activities;Therapeutic exercise;Neuromuscular re-education;Manual techniques;Scar mobilization;Passive range of motion   PT Next Visit Plan Complete LEFS for goals, continue mnaual  therapy, increase HEP    PT Home Exercise Plan continue as given   Consulted and Agree with Plan of Care Patient        Problem List Patient Active Problem List   Diagnosis Date Noted  . Absence of menstruation 07/09/2014  . Anxiety, generalized 07/09/2014  . Clinical depression 07/09/2014  . Diabetes mellitus, type 2 07/09/2014  . Dysfunction of eustachian tube 07/09/2014  . Hemorrhoid 07/09/2014  . Hypercholesteremia 07/09/2014  . Gonalgia 07/09/2014  . Cramps of lower extremity 07/09/2014  . Nonspecific ST-T changes 07/09/2014  . Cervical polyp 07/09/2014  . Borderline diabetes 07/09/2014  . RAD (reactive airway disease) 07/09/2014  . Tendinitis 07/09/2014  . Avitaminosis D 07/09/2014  . Absolute anemia 08/29/2008  . Cholecystitis, chronic 09/20/2007  . Abnormal blood sugar 07/31/2003  . Adiposity 03/01/1998  . Depression, neurotic 08/01/1997  . Adaptive colitis 08/24/1995  . Acid reflux 08/14/1995   Barrie Folk SPT 11/07/2014   5:08 PM  This entire session was performed under direct supervision and direction of a licensed therapist .  I have personally read, edited and approve of the note as written.  Hopkins,Margaret PT, DPT 11/07/2014, 5:08 PM  [place] MAIN [facility] SERVICES [address] [place], [place], [address] Phone: [phone]   Fax:  [phone]

## 2014-11-08 ENCOUNTER — Ambulatory Visit: Payer: 59 | Admitting: Physical Therapy

## 2014-11-25 ENCOUNTER — Other Ambulatory Visit: Payer: Self-pay | Admitting: Physician Assistant

## 2014-11-25 DIAGNOSIS — E119 Type 2 diabetes mellitus without complications: Secondary | ICD-10-CM

## 2014-11-29 NOTE — Progress Notes (Signed)
This encounter was created in error - please disregard.

## 2014-12-05 ENCOUNTER — Encounter: Payer: Self-pay | Admitting: Podiatry

## 2014-12-05 ENCOUNTER — Ambulatory Visit (INDEPENDENT_AMBULATORY_CARE_PROVIDER_SITE_OTHER): Payer: 59 | Admitting: Podiatry

## 2014-12-05 DIAGNOSIS — M205X1 Other deformities of toe(s) (acquired), right foot: Secondary | ICD-10-CM

## 2014-12-05 DIAGNOSIS — Z9889 Other specified postprocedural states: Secondary | ICD-10-CM

## 2014-12-05 DIAGNOSIS — M722 Plantar fascial fibromatosis: Secondary | ICD-10-CM

## 2014-12-05 NOTE — Progress Notes (Signed)
Patient ID: Miranda Huang, female   DOB: 1960-11-26, 54 y.o.   MRN: [id number]  DOS: 08/23/14 s/p Jake Michaelis arthroplasty and EPF Right foot.   Subjective: 54 year old female presents to the office today  For bilateral plantar fascial tapings. She is s/p right foot surgery with Dr. Milinda Pointer. She has been going to physical therapy. Due to work and the left foot has been getting inflamed as well. She presents today for just a taping to her feet as this makes it feel better. She does continue stretching, ice and activities daily. She wears night splint intermittently. She takes meloxicam as needed. She thinks that she may need a shot in the left side however she wants to hold off on this time. No other complaints at this time.  Objective: AAO x3, NAD Impression status intact and unchanged There is no palpation along the plantar medial tubercle of the insertion the plantar fascia bilaterally. There is modest, medial and the plantar fashion the arch of the foot. There is no pain with lateral compression the calcaneus. There is no overlying edema, erythema, increase in warmth. No other areas of edema to bilateral lower extremities No calf pain, swelling  Assessment: S/p Right post Keller arthroplasty and EPF with continued heel pain bilaterally   Plan: -Treatment options discussed including all alternatives, risks, and complications -Plantar fascial taping was applied. She states it feels much better walking out with the tapings.  -Continue physical therapy. Continue with icing, stretching activities day. Continue Anti-inflammatories. -She states that she'll followup with Dr. Milinda Pointer in the near future for possible steroid injection for further evaluation.  Celesta Gentile, DPM

## 2014-12-18 ENCOUNTER — Telehealth: Payer: Self-pay | Admitting: *Deleted

## 2014-12-18 NOTE — Telephone Encounter (Signed)
Called and left a message for the patient that the inserts were in and to come by and she could pick them up. Miranda Huang

## 2014-12-25 ENCOUNTER — Telehealth: Payer: Self-pay

## 2015-01-15 ENCOUNTER — Encounter: Payer: Self-pay | Admitting: Podiatry

## 2015-01-15 ENCOUNTER — Telehealth: Payer: Self-pay | Admitting: *Deleted

## 2015-01-15 ENCOUNTER — Ambulatory Visit (INDEPENDENT_AMBULATORY_CARE_PROVIDER_SITE_OTHER): Payer: 59 | Admitting: Podiatry

## 2015-01-15 VITALS — BP 111/70 | HR 79 | Resp 18

## 2015-01-15 DIAGNOSIS — I872 Venous insufficiency (chronic) (peripheral): Secondary | ICD-10-CM

## 2015-01-15 DIAGNOSIS — M722 Plantar fascial fibromatosis: Secondary | ICD-10-CM

## 2015-01-15 NOTE — Progress Notes (Signed)
She presents today for follow-up of her pain to her right foot and left foot. She is retains pain to the plantar fascial area of the right foot even after an endoscopic plantar fasciotomy. She states that the left foot seems to be worse.  Objective: Vital signs stable alert and oriented 3. Pulses are strongly palpable. She has pain on palpation medial calcaneal tubercles bilateral. Left worse than right.  Assessment: Well healing surgical toes. Chronic intractable plantar fasciitis bilateral. Status post EPF right foot.  Plan: Discussed etiology pathology conservative versus surgical therapies. Injected the bilateral heels today with Kenalog and local anesthetic and I will follow-up with her in the near future.

## 2015-01-15 NOTE — Telephone Encounter (Signed)
Orders faxed to [facility] and called to pt, left msg with pt on mobile that if would like to change to [facility] will do so.

## 2015-01-29 ENCOUNTER — Telehealth: Payer: Self-pay

## 2015-02-14 ENCOUNTER — Ambulatory Visit: Payer: Self-pay | Admitting: Physician Assistant

## 2015-02-17 ENCOUNTER — Ambulatory Visit: Payer: 59 | Admitting: Podiatry

## 2015-02-17 ENCOUNTER — Telehealth: Payer: Self-pay

## 2015-02-17 NOTE — Telephone Encounter (Signed)
Refill request from [facility] for Xanax, LOV 05/2014

## 2015-02-18 NOTE — Telephone Encounter (Signed)
I think she now sees you--thanks

## 2015-02-18 NOTE — Telephone Encounter (Signed)
I have never prescribed her xanax.  I have only refilled her lexapro and have only seen her once.  She will need appt for refill.  Thanks.

## 2015-02-20 ENCOUNTER — Encounter: Payer: Self-pay | Admitting: Physician Assistant

## 2015-02-20 ENCOUNTER — Ambulatory Visit: Payer: Self-pay | Admitting: Physician Assistant

## 2015-02-20 VITALS — BP 112/72 | HR 80 | Temp 98.3°F

## 2015-02-20 DIAGNOSIS — J069 Acute upper respiratory infection, unspecified: Secondary | ICD-10-CM

## 2015-02-20 MED ORDER — HYDROCOD POLST-CPM POLST ER 10-8 MG/5ML PO SUER: 5.0000 mL | Freq: Two times a day (BID) | ORAL | Status: DC | PRN

## 2015-02-20 MED ORDER — FLUCONAZOLE 150 MG PO TABS: 150.0000 mg | ORAL_TABLET | Freq: Once | ORAL | Status: DC

## 2015-02-20 MED ORDER — ALBUTEROL SULFATE HFA 108 (90 BASE) MCG/ACT IN AERS: 2.0000 | INHALATION_SPRAY | RESPIRATORY_TRACT | Status: DC | PRN

## 2015-02-20 MED ORDER — CEFDINIR 300 MG PO CAPS: 300.0000 mg | ORAL_CAPSULE | Freq: Two times a day (BID) | ORAL | Status: DC

## 2015-02-20 NOTE — Progress Notes (Signed)
S: C/o runny nose and congestion for 2 days, no fever, chills, cp/sob, v/d; mucus was yellow this am, cough is sporadic, gets bronchitis easily, needs to start back on inhaler so it doesn't settle in her chest  Using otc meds: dayquil, mucinex, benadryl, zyrtec  O: PE: vitals wnl, nad,  perrl eomi, normocephalic, tms dull, nasal mucosa red and swollen, throat injected, neck supple no lymph, lungs c t a, cv rrr, neuro intact, cough is dry and hacking  A:  Acute uri   P: omnicef, albuterol, diflucan, tussionex, drink fluids, continue regular meds , use otc meds of choice, return if not improving in 5 days, return earlier if worsening , will call in steroid pack if cough worsening

## 2015-03-05 ENCOUNTER — Ambulatory Visit: Payer: 59 | Admitting: Pharmacist

## 2015-03-19 ENCOUNTER — Ambulatory Visit: Payer: Self-pay | Admitting: Pharmacist

## 2015-03-25 ENCOUNTER — Encounter: Payer: Self-pay | Admitting: Physician Assistant

## 2015-03-25 ENCOUNTER — Ambulatory Visit (INDEPENDENT_AMBULATORY_CARE_PROVIDER_SITE_OTHER): Payer: 59 | Admitting: Physician Assistant

## 2015-03-25 VITALS — BP 120/80 | HR 93 | Temp 98.1°F | Resp 16

## 2015-03-25 DIAGNOSIS — F419 Anxiety disorder, unspecified: Secondary | ICD-10-CM | POA: Diagnosis not present

## 2015-03-25 DIAGNOSIS — E119 Type 2 diabetes mellitus without complications: Secondary | ICD-10-CM | POA: Diagnosis not present

## 2015-03-25 LAB — POCT UA - MICROALBUMIN: Microalbumin Ur, POC: NEGATIVE mg/L

## 2015-03-25 LAB — POCT GLYCOSYLATED HEMOGLOBIN (HGB A1C)
Est. average glucose Bld gHb Est-mCnc: 148
HEMOGLOBIN A1C: 6.8

## 2015-03-25 MED ORDER — METFORMIN HCL ER (MOD) 1000 MG PO TB24: 1000.0000 mg | ORAL_TABLET | Freq: Every day | ORAL | Status: DC

## 2015-03-25 MED ORDER — ALPRAZOLAM 0.25 MG PO TABS: 0.2500 mg | ORAL_TABLET | Freq: Every evening | ORAL | Status: DC | PRN

## 2015-03-25 NOTE — Patient Instructions (Signed)
Diabetes Mellitus and Food It is important for you to manage your blood sugar (glucose) level. Your blood glucose level can be greatly affected by what you eat. Eating healthier foods in the appropriate amounts throughout the day at about the same time each day will help you control your blood glucose level. It can also help slow or prevent worsening of your diabetes mellitus. Healthy eating may even help you improve the level of your blood pressure and reach or maintain a healthy weight.  General recommendations for healthful eating and cooking habits include:  Eating meals and snacks regularly. Avoid going long periods of time without eating to lose weight.  Eating a diet that consists mainly of plant-based foods, such as fruits, vegetables, nuts, legumes, and whole grains.  Using low-heat cooking methods, such as baking, instead of high-heat cooking methods, such as deep frying. Work with your dietitian to make sure you understand how to use the Nutrition Facts information on food labels. HOW CAN FOOD AFFECT ME? Carbohydrates Carbohydrates affect your blood glucose level more than any other type of food. Your dietitian will help you determine how many carbohydrates to eat at each meal and teach you how to count carbohydrates. Counting carbohydrates is important to keep your blood glucose at a healthy level, especially if you are using insulin or taking certain medicines for diabetes mellitus. Alcohol Alcohol can cause sudden decreases in blood glucose (hypoglycemia), especially if you use insulin or take certain medicines for diabetes mellitus. Hypoglycemia can be a life-threatening condition. Symptoms of hypoglycemia (sleepiness, dizziness, and disorientation) are similar to symptoms of having too much alcohol.  If your health care provider has given you approval to drink alcohol, do so in moderation and use the following guidelines:  Women should not have more than one drink per day, and men  should not have more than two drinks per day. One drink is equal to:  12 oz of beer.  5 oz of wine.  1 oz of hard liquor.  Do not drink on an empty stomach.  Keep yourself hydrated. Have water, diet soda, or unsweetened iced tea.  Regular soda, juice, and other mixers might contain a lot of carbohydrates and should be counted. WHAT FOODS ARE NOT RECOMMENDED? As you make food choices, it is important to remember that all foods are not the same. Some foods have fewer nutrients per serving than other foods, even though they might have the same number of calories or carbohydrates. It is difficult to get your body what it needs when you eat foods with fewer nutrients. Examples of foods that you should avoid that are high in calories and carbohydrates but low in nutrients include:  Trans fats (most processed foods list trans fats on the Nutrition Facts label).  Regular soda.  Juice.  Candy.  Sweets, such as cake, pie, doughnuts, and cookies.  Fried foods. WHAT FOODS CAN I EAT? Eat nutrient-rich foods, which will nourish your body and keep you healthy. The food you should eat also will depend on several factors, including:  The calories you need.  The medicines you take.  Your weight.  Your blood glucose level.  Your blood pressure level.  Your cholesterol level. You should eat a variety of foods, including:  Protein.  Lean cuts of meat.  Proteins low in saturated fats, such as fish, egg whites, and beans. Avoid processed meats.  Fruits and vegetables.  Fruits and vegetables that may help control blood glucose levels, such as apples, mangoes, and   yams.  Dairy products.  Choose fat-free or low-fat dairy products, such as milk, yogurt, and cheese.  Grains, bread, pasta, and rice.  Choose whole grain products, such as multigrain bread, whole oats, and brown rice. These foods may help control blood pressure.  Fats.  Foods containing healthful fats, such as nuts,  avocado, olive oil, canola oil, and fish. DOES EVERYONE WITH DIABETES MELLITUS HAVE THE SAME MEAL PLAN? Because every person with diabetes mellitus is different, there is not one meal plan that works for everyone. It is very important that you meet with a dietitian who will help you create a meal plan that is just right for you.   This information is not intended to replace advice given to you by your health care provider. Make sure you discuss any questions you have with your health care provider.   Document Released: 11/12/2004 Document Revised: 03/08/2014 Document Reviewed: 01/12/2013 Elsevier Interactive Patient Education 2016 Elsevier Inc.  

## 2015-03-25 NOTE — Progress Notes (Signed)
Patient: Miranda Huang Female    DOB: [birth year]   55 y.o.   MRN: [id number] Visit Date: 03/25/2015  Today's Provider: Mar Daring, PA-C   Chief Complaint  Patient presents with  . Diabetes   Subjective:    Diabetes She presents for her follow-up diabetic visit. She has type 2 diabetes mellitus. Her disease course has been fluctuating. Pertinent negatives for hypoglycemia include no confusion, dizziness, headaches, sleepiness or sweats. Pertinent negatives for diabetes include no blurred vision, no chest pain, no fatigue, no foot ulcerations, no polydipsia, no polyphagia, no polyuria and no visual change. Pertinent negatives for hypoglycemia complications include no blackouts. Symptoms are stable. Current diabetic treatment includes oral agent (monotherapy). She is compliant with treatment all of the time. She is following a generally healthy diet. She has not had a previous visit with a dietitian. She never participates in exercise. Her breakfast blood glucose is taken between 6-7 am. Her breakfast blood glucose range is generally 140-180 mg/dl. Her highest blood glucose is 140-180 mg/dl. She sees a podiatrist (Has an appointmnet tomorrow.).Eye exam is current (Has an appointment coming up Feb. 2017).      Allergies  Allergen Reactions  . Erythromycin Nausea And Vomiting    GI upset  . Tussin Cough  [Dextromethorphan Hbr] Other (See Comments)    "Wacky Dreams"   Previous Medications   ALBUTEROL (PROVENTIL HFA;VENTOLIN HFA) 108 (90 BASE) MCG/ACT INHALER    Inhale 2 puffs into the lungs every 4 (four) hours as needed for wheezing or shortness of breath. Reported on 02/20/2015   CETIRIZINE (ZYRTEC) 10 MG TABLET    Take 1 tablet by mouth daily.   CHLORPHENIRAMINE-HYDROCODONE (TUSSIONEX PENNKINETIC ER) 10-8 MG/5ML SUER    Take 5 mLs by mouth every 12 (twelve) hours as needed for cough.   ESCITALOPRAM (LEXAPRO) 20 MG TABLET    Take 20 mg by mouth daily.   FUROSEMIDE (LASIX)  20 MG TABLET    Take 1 tablet by mouth daily as needed.   GLUCOSE BLOOD TEST STRIP    BAYER CONTOUR TEST (In Vitro Strip)  1 (one) Strip two times daily, and as needed for 0 days  Quantity: 100;  Refills: 12   Ordered :11-Jun-2014  Althea Charon ;  Started 11-Jun-2014 Active Comments: Medication taken as needed.   HYDROCHLOROTHIAZIDE (HYDRODIURIL) 25 MG TABLET    Take 1 tablet by mouth daily.   LISINOPRIL (PRINIVIL,ZESTRIL) 5 MG TABLET    Take 1 tablet by mouth daily.   METFORMIN (GLUCOPHAGE-XR) 500 MG 24 HR TABLET    TAKE 1 TABLET BY MOUTH ONCE DAILY   OMEPRAZOLE (PRILOSEC) 20 MG CAPSULE    Take 1 capsule by mouth daily.   PROBIOTIC CAPS    Take 1 capsule by mouth daily.    Review of Systems  Constitutional: Negative for fatigue.  HENT: Negative.   Eyes: Negative for blurred vision.  Respiratory: Negative.   Cardiovascular: Negative.  Negative for chest pain.  Gastrointestinal: Negative.   Endocrine: Negative for polydipsia, polyphagia and polyuria.  Neurological: Negative for dizziness and headaches.  Psychiatric/Behavioral: Negative for confusion.    Social History  Substance Use Topics  . Smoking status: Never Smoker   . Smokeless tobacco: Never Used  . Alcohol Use: 0.0 oz/week    0 Standard drinks or equivalent per week     Comment: occassionally 1-2 glasses of wine every other week    Objective:   BP 120/80 mmHg  Pulse  93  Temp(Src) 98.1 F (36.7 C) (Oral)  Resp 16  Wt   Physical Exam  Constitutional: She appears well-developed and well-nourished. No distress.  Neck: Normal range of motion. Neck supple. No JVD present. No tracheal deviation present. No thyromegaly present.  Cardiovascular: Normal rate, regular rhythm and normal heart sounds.  Exam reveals no gallop and no friction rub.   No murmur heard. Pulmonary/Chest: Effort normal and breath sounds normal. No respiratory distress. She has no wheezes. She has no rales.  Lymphadenopathy:    She has no cervical  adenopathy.  Skin: She is not diaphoretic.  Vitals reviewed.       Assessment & Plan:     1. Type 2 diabetes mellitus without complication, without long-term current use of insulin (HCC) Hemoglobin A1c was 6.8. Microalbumin was negative. We'll continue metformin as below. She is to continue adhering to a diabetic diet. I also advised to continue to try to add physical activity to her routine. I will see her back in 4 months to recheck labs and for her complete annual physical exam. She is to call the office in the meantime if she has any acute issue, question or concerns. - POCT HgB A1C - POCT UA - Microalbumin - metFORMIN (GLUMETZA) 1000 MG (MOD) 24 hr tablet; Take 1 tablet (1,000 mg total) by mouth daily with breakfast.  Dispense: 90 tablet; Refill: 0  2. Acute anxiety Stable with Xanax 0.25 mg. She states she only uses this mostly at night to help her sleep but does not use it nightly. Medication was refilled as below. She is to call the office if symptoms worsen in the meantime. - ALPRAZolam (XANAX) 0.25 MG tablet; Take 1 tablet (0.25 mg total) by mouth at bedtime as needed for anxiety.  Dispense: 30 tablet; Refill: [place], PA-C  [facility]

## 2015-03-26 ENCOUNTER — Ambulatory Visit (INDEPENDENT_AMBULATORY_CARE_PROVIDER_SITE_OTHER): Payer: 59 | Admitting: Podiatry

## 2015-03-26 ENCOUNTER — Encounter: Payer: Self-pay | Admitting: Podiatry

## 2015-03-26 VITALS — BP 111/62 | HR 91 | Resp 16

## 2015-03-26 DIAGNOSIS — M722 Plantar fascial fibromatosis: Secondary | ICD-10-CM

## 2015-03-26 MED ORDER — DICLOFENAC SODIUM 75 MG PO TBEC: 75.0000 mg | DELAYED_RELEASE_TABLET | Freq: Two times a day (BID) | ORAL | Status: DC

## 2015-03-26 NOTE — Progress Notes (Signed)
She presents today for follow-up bilateral plantar fasciitis. She states that the meloxicam did absolutely nothing for me. And currently she is unable to have surgery because of her husband's rotator cuff surgery upcoming.  Objective: Vital signs stable alert and oriented 3. Pulses are palpable. She has moderate to severe pain on palpation medial calcaneal tubercles bilateral.  Assessment: Plantar fasciitis bilateral left greater than right.  Plan: Injected the bilateral heels today with Kenalog and local anesthetic and provided her with diclofenac 75 mg 1 by mouth twice a day #60 with 3 refills. Follow-up with her in 1 month

## 2015-04-14 ENCOUNTER — Other Ambulatory Visit: Payer: Self-pay | Admitting: Physician Assistant

## 2015-04-14 DIAGNOSIS — H524 Presbyopia: Secondary | ICD-10-CM | POA: Diagnosis not present

## 2015-04-14 DIAGNOSIS — J069 Acute upper respiratory infection, unspecified: Secondary | ICD-10-CM

## 2015-04-14 DIAGNOSIS — H52223 Regular astigmatism, bilateral: Secondary | ICD-10-CM | POA: Diagnosis not present

## 2015-04-16 ENCOUNTER — Other Ambulatory Visit: Payer: Self-pay | Admitting: Pharmacist

## 2015-04-18 NOTE — Patient Outreach (Signed)
Patient was scheduled for a Link To Wellness visit with the pharmacist on 04-16-15 and did not show.  Alexias Margerum K. Dicky Doe, PharmD [facility] [phone]

## 2015-04-23 ENCOUNTER — Other Ambulatory Visit: Payer: Self-pay | Admitting: Pharmacist

## 2015-04-24 NOTE — Patient Outreach (Signed)
Ms. Fahl initial Link To Wellness visit with the pharmacist was via the phone. She has started to eat breakfast more regularly which includes a smoothie with yogurt, fruit and vegetables. Her metformin was recently increased to 1000 mg per day as her am blood sugars were approaching the 200's. She is motivated to lose weight post foot surgery in 07/2014.  A1c = 6.8% on 03/25/15.  Cholesterol panel on 06/11/14: TC = 210 mg/dl; TG = 127 mg/dl; HDL = 44 mg/dl; LDL = 141 mg/dl.  TSH = 2.162 on 06/11/14.  Her last eye exam was on 04/15/15, however, she will need to make another appointment for a dilated exam.   Care Plan: Ms. Detty will maintain A1c below 7% over the next 90 days. Ms. Millman will start back with exercising 3 times per week for 30 minutes per session over the next 90 days. Ms. Bava will schedule a dental checkup over the next 90 days.  Assessment: 1. Diabetes: A1c within goal of less than 7% 2. Cholesterol: TC not at goal of less than 200 mg/dl; TG at goal of less than 150 mg/dl; HDL not at goal of greater than 50 mg/dl; LDL not at goal of less than 100 mg/dl.  Plan:  1. Patient will return in 6 months for a follow up Link To Wellness visit with the pharmacist. 2. Patient will purchase Red Yeast Rice to help lower cholesterol. 3. Patient will ask her PA about starting Aspirin at next visit.  Shamica Moree K. Dicky Doe, PharmD [facility] [phone]

## 2015-05-02 NOTE — Patient Outreach (Signed)
Patient was not able to attend in person on 04/23/15. The appointment was changed from a patient outreach to patient outreach telephone. Patient was able to complete the Link To Wellness encounter over the phone. Please see the patient outreach telephone note for 04/23/15.

## 2015-06-03 ENCOUNTER — Encounter: Payer: Self-pay | Admitting: Physician Assistant

## 2015-06-10 ENCOUNTER — Other Ambulatory Visit: Payer: Self-pay | Admitting: Family Medicine

## 2015-06-10 DIAGNOSIS — F32A Depression, unspecified: Secondary | ICD-10-CM

## 2015-06-10 DIAGNOSIS — F329 Major depressive disorder, single episode, unspecified: Secondary | ICD-10-CM

## 2015-06-16 DIAGNOSIS — M79609 Pain in unspecified limb: Secondary | ICD-10-CM | POA: Diagnosis not present

## 2015-06-16 DIAGNOSIS — R609 Edema, unspecified: Secondary | ICD-10-CM | POA: Diagnosis not present

## 2015-06-16 DIAGNOSIS — M7989 Other specified soft tissue disorders: Secondary | ICD-10-CM | POA: Diagnosis not present

## 2015-06-23 ENCOUNTER — Ambulatory Visit (INDEPENDENT_AMBULATORY_CARE_PROVIDER_SITE_OTHER): Payer: 59 | Admitting: Podiatry

## 2015-06-23 ENCOUNTER — Encounter: Payer: Self-pay | Admitting: Podiatry

## 2015-06-23 VITALS — BP 105/62 | HR 80 | Resp 12

## 2015-06-23 DIAGNOSIS — M722 Plantar fascial fibromatosis: Secondary | ICD-10-CM | POA: Diagnosis not present

## 2015-06-23 NOTE — Progress Notes (Signed)
She presents today for follow-up of her fasciitis. She states this seems to be getting worse now that she is starting to develop pain at the back of the heel as well.  Objective: Vital signs are stable she is alert and oriented 3. Pulses are palpable. Neurologic sensorium is intact. Deep tendon reflexes are intact. She has pain on palpation medial calcaneal tubercles bilateral no tenderness on palpation of the Achilles bilateral.  Assessment: Plantar fasciitis with compensatory tendinitis.  Plan: I injected the area lateral aspect of the bilateral heels today and she will continue conservative therapies. We discussed the need for further physical therapy also discussed the need for possible MRI of the bilateral foot with surgery. She has previously had surgery to the right foot including a Keller arthroplasty with a single silicone implant and a EPF.

## 2015-07-09 ENCOUNTER — Ambulatory Visit: Payer: 59 | Admitting: Podiatry

## 2015-07-16 DIAGNOSIS — I83813 Varicose veins of bilateral lower extremities with pain: Secondary | ICD-10-CM | POA: Diagnosis not present

## 2015-07-16 DIAGNOSIS — R609 Edema, unspecified: Secondary | ICD-10-CM | POA: Diagnosis not present

## 2015-07-16 DIAGNOSIS — M7989 Other specified soft tissue disorders: Secondary | ICD-10-CM | POA: Diagnosis not present

## 2015-07-16 DIAGNOSIS — M79609 Pain in unspecified limb: Secondary | ICD-10-CM | POA: Diagnosis not present

## 2015-08-04 ENCOUNTER — Ambulatory Visit (INDEPENDENT_AMBULATORY_CARE_PROVIDER_SITE_OTHER): Payer: 59 | Admitting: Podiatry

## 2015-08-04 ENCOUNTER — Encounter: Payer: Self-pay | Admitting: Podiatry

## 2015-08-04 ENCOUNTER — Other Ambulatory Visit: Payer: Self-pay | Admitting: Physician Assistant

## 2015-08-04 ENCOUNTER — Telehealth: Payer: Self-pay | Admitting: *Deleted

## 2015-08-04 DIAGNOSIS — M722 Plantar fascial fibromatosis: Secondary | ICD-10-CM

## 2015-08-04 DIAGNOSIS — E119 Type 2 diabetes mellitus without complications: Secondary | ICD-10-CM

## 2015-08-04 DIAGNOSIS — R609 Edema, unspecified: Secondary | ICD-10-CM

## 2015-08-04 MED ORDER — CELECOXIB 200 MG PO CAPS: 200.0000 mg | ORAL_CAPSULE | Freq: Every day | ORAL | Status: DC

## 2015-08-04 MED ORDER — GABAPENTIN 100 MG PO CAPS: 100.0000 mg | ORAL_CAPSULE | Freq: Every day | ORAL | Status: DC

## 2015-08-04 NOTE — Progress Notes (Signed)
She presents today for chronic intractable plantar fasciitis bilateral foot. She states that after the last injection as there was absolutely no change in my feet have just been terrible.  Objective: Vital signs are stable she is alert and oriented 3. Pulses are palpable. She has severe pain on palpation medial calcaneal tubercles bilateral.  Assessment: Chronic intractable plantar fasciitis bilateral.  Plan: Discussed etiology pathology conservative versus surgical therapy started her on Celebrex 200 mg 1 by mouth daily 30 with 3 refills. I also recommended MRI of the bilateral foot because of the chronic intractable plantar fasciitis though a fasciotomy has been performed to the right foot.

## 2015-08-04 NOTE — Telephone Encounter (Addendum)
Orders for MRIs given to [facility] for [employer]. [id number] PRE-CERT IS REQUIRED FOR MRI RIGHT AND LEFT FEET. Faxed to [facility]. Dr. Milinda Pointer states 08/04/2015 looks good.  Left message informing pt of Dr. Stephenie Acres statement and that she could call to set up her MRIs. 08/12/2015-Pt states she has seen Dr. Willeen Cass and she is to wear compression hose and pt had Dr. Willeen Cass send chart notes to Dr. Milinda Pointer.

## 2015-08-05 LAB — SEDIMENTATION RATE: Sed Rate: 3 mm/hr (ref 0–40)

## 2015-08-05 LAB — URIC ACID: URIC ACID: 5.5 mg/dL (ref 2.5–7.1)

## 2015-08-05 LAB — ANA: Anti Nuclear Antibody(ANA): NEGATIVE

## 2015-08-05 LAB — C-REACTIVE PROTEIN: CRP: 3 mg/L (ref 0.0–4.9)

## 2015-08-05 LAB — RHEUMATOID FACTOR

## 2015-08-05 NOTE — Telephone Encounter (Signed)
-----   Message from Garrel Ridgel, [place] sent at 08/05/2015  4:56 PM EDT ----- Blood work looks good.

## 2015-08-06 ENCOUNTER — Encounter: Payer: Self-pay | Admitting: Physician Assistant

## 2015-08-06 ENCOUNTER — Ambulatory Visit (INDEPENDENT_AMBULATORY_CARE_PROVIDER_SITE_OTHER): Payer: 59 | Admitting: Physician Assistant

## 2015-08-06 VITALS — BP 112/60 | HR 88 | Temp 98.2°F | Resp 16 | Ht 65.0 in | Wt 216.8 lb

## 2015-08-06 DIAGNOSIS — E559 Vitamin D deficiency, unspecified: Secondary | ICD-10-CM | POA: Diagnosis not present

## 2015-08-06 DIAGNOSIS — D649 Anemia, unspecified: Secondary | ICD-10-CM

## 2015-08-06 DIAGNOSIS — E119 Type 2 diabetes mellitus without complications: Secondary | ICD-10-CM

## 2015-08-06 DIAGNOSIS — K2100 Gastro-esophageal reflux disease with esophagitis, without bleeding: Secondary | ICD-10-CM

## 2015-08-06 DIAGNOSIS — E7800 Pure hypercholesterolemia, unspecified: Secondary | ICD-10-CM | POA: Diagnosis not present

## 2015-08-06 DIAGNOSIS — Z0000 Encounter for general adult medical examination without abnormal findings: Secondary | ICD-10-CM | POA: Diagnosis not present

## 2015-08-06 DIAGNOSIS — Z1239 Encounter for other screening for malignant neoplasm of breast: Secondary | ICD-10-CM | POA: Diagnosis not present

## 2015-08-06 DIAGNOSIS — M722 Plantar fascial fibromatosis: Secondary | ICD-10-CM | POA: Diagnosis not present

## 2015-08-06 DIAGNOSIS — I872 Venous insufficiency (chronic) (peripheral): Secondary | ICD-10-CM

## 2015-08-06 HISTORY — DX: Plantar fascial fibromatosis: M72.2

## 2015-08-06 MED ORDER — ESOMEPRAZOLE MAGNESIUM 20 MG PO CPDR: 20.0000 mg | DELAYED_RELEASE_CAPSULE | Freq: Every day | ORAL | Status: DC

## 2015-08-06 NOTE — Progress Notes (Signed)
Patient: Miranda Huang, Female    DOB: 15-Apr-1960, 55 y.o.   MRN: [id number] Visit Date: 08/06/2015  Today's Provider: Mar Daring, PA-C   Chief Complaint  Patient presents with  . Annual Exam   Subjective:    Annual physical exam Miranda Huang is a 55 y.o. female who presents today for health maintenance and complete physical. She feels well. She reports exercising none. She reports she is sleeping well.  She also reports that she went to see Dr. Ronalee Belts (vein/vascular) due to the leg swelling and they did a venousUS-no blood clots. She does have venous insufficiency bilaterally and is going to be in 20-25mmHg grade compression stockings.   She also has been seeing Dr. Milinda Pointer for bilateral plantar fasciitis. She has had a release on the right last June 2016, but still having pain. She is scheduled to have an MRI next week on Tuesday. This will be to evaluate if pain is due to plantar fasciitis vs neuropathy.  Pap:07/12/2013 Normal, HPV-Negative Colonoscopy:10/11/2008 Normal; repeat in 10 years Mammogram: 10/29/2014 BI-RADS 1: Negative DM Foot:07/12/2013 DM Eye Exam:04/14/2015  -----------------------------------------------------------------   Review of Systems  Constitutional: Negative.   HENT: Negative.   Eyes: Negative.   Respiratory: Negative.   Cardiovascular: Positive for leg swelling.  Gastrointestinal: Negative.   Endocrine: Negative.   Genitourinary: Negative.   Musculoskeletal: Positive for arthralgias (bilateral feet).  Skin: Negative.   Allergic/Immunologic: Negative.   Neurological: Negative.   Hematological: Negative.   Psychiatric/Behavioral: Negative.     Social History      She  reports that she has never smoked. She has never used smokeless tobacco. She reports that she drinks alcohol. She reports that she does not use illicit drugs.       Social History   Social History  . Marital Status: Married    Spouse Name: N/A  . Number  of Children: N/A  . Years of Education: N/A   Social History Main Topics  . Smoking status: Never Smoker   . Smokeless tobacco: Never Used  . Alcohol Use: 0.0 oz/week    0 Standard drinks or equivalent per week     Comment: occassionally 1-2 glasses of wine every other week   . Drug Use: No  . Sexual Activity: Not Asked   Other Topics Concern  . None   Social History Narrative    Past Medical History  Diagnosis Date  . Anxiety   . Depression   . Swelling   . Pre-diabetes      Patient Active Problem List   Diagnosis Date Noted  . Absence of menstruation 07/09/2014  . Anxiety, generalized 07/09/2014  . Clinical depression 07/09/2014  . Diabetes mellitus, type 2 ([place]) 07/09/2014  . Dysfunction of eustachian tube 07/09/2014  . Hemorrhoid 07/09/2014  . Hypercholesteremia 07/09/2014  . Gonalgia 07/09/2014  . Cramps of lower extremity 07/09/2014  . Nonspecific ST-T changes 07/09/2014  . Cervical polyp 07/09/2014  . Borderline diabetes 07/09/2014  . RAD (reactive airway disease) 07/09/2014  . Tendinitis 07/09/2014  . Avitaminosis D 07/09/2014  . Absolute anemia 08/29/2008  . Cholecystitis, chronic 09/20/2007  . Abnormal blood sugar 07/31/2003  . Adiposity 03/01/1998  . Depression, neurotic 08/01/1997  . Adaptive colitis 08/24/1995  . Acid reflux 08/14/1995    Past Surgical History  Procedure Laterality Date  . Cholecystectomy  2009  . Cesarean section  1985 and 1990  . Dilation and curettage of uterus  2009  .  Tonsillectomy and adenoidectomy      Family History        Family Status  Relation Status Death Age  . Father Deceased     cardiac arrest early forties  . Paternal Grandmother Alive   . Mother Alive         Her family history includes Alcohol abuse in her father; Anxiety disorder in her father; Breast cancer (age of onset: 29) in her maternal aunt; Diabetes in her paternal grandmother; Heart disease in her father; Pancreatitis in her father.     Allergies  Allergen Reactions  . Codeine Itching    Pt pretreats with Benadryl  . Erythromycin Nausea And Vomiting    GI upset  . Tussin Cough  [Dextromethorphan Hbr] Other (See Comments)    "Wacky Dreams"    Current Meds  Medication Sig  . albuterol (PROVENTIL HFA;VENTOLIN HFA) 108 (90 BASE) MCG/ACT inhaler Inhale 2 puffs into the lungs every 4 (four) hours as needed for wheezing or shortness of breath. Reported on 02/20/2015  . ALPRAZolam (XANAX) 0.25 MG tablet Take 1 tablet (0.25 mg total) by mouth at bedtime as needed for anxiety.  . celecoxib (CELEBREX) 200 MG capsule Take 1 capsule (200 mg total) by mouth daily.  . cetirizine (ZYRTEC) 10 MG tablet Take 1 tablet by mouth daily.  Marland Kitchen escitalopram (LEXAPRO) 10 MG tablet TAKE 1 TO 2 TABLETS BY MOUTH DAILY.  Marland Kitchen gabapentin (NEURONTIN) 100 MG capsule Take 1 capsule (100 mg total) by mouth at bedtime.  Marland Kitchen glucose blood test strip BAYER CONTOUR TEST (In Vitro Strip)  1 (one) Strip two times daily, and as needed for 0 days  Quantity: 100;  Refills: 12   Ordered :11-Jun-2014  Althea Charon ;  Started 11-Jun-2014 Active Comments: Medication taken as needed.  . hydrochlorothiazide (HYDRODIURIL) 25 MG tablet Take 1 tablet by mouth daily.  Marland Kitchen lisinopril (PRINIVIL,ZESTRIL) 5 MG tablet Take 1 tablet by mouth daily.  . metFORMIN (GLUCOPHAGE-XR) 500 MG 24 hr tablet TAKE 2 TABLETS BY MOUTH WITH BREAKFAST  . omeprazole (PRILOSEC) 20 MG capsule Take 1 capsule by mouth daily.  . Probiotic CAPS Take 1 capsule by mouth daily.    Patient Care Team: Mar Daring, PA-C as PCP - General (Physician Assistant) Cammy Copa, [facility] as [place] Management (Pharmacist)     Objective:   Vitals: BP 112/60 mmHg  Pulse 88  Temp(Src) 98.2 F (36.8 C) (Oral)  Resp 16  Ht 5\' 5"  (1.651 m)  Wt 216 lb 12.8 oz (98.34 kg)  BMI 36.08 kg/m2  LMP    Physical Exam  Constitutional: She is oriented to person, place, and time. She appears  well-developed and well-nourished. No distress.  HENT:  Head: Normocephalic and atraumatic.  Right Ear: External ear normal.  Left Ear: External ear normal.  Nose: Nose normal.  Mouth/Throat: Oropharynx is clear and moist. No oropharyngeal exudate.  Eyes: Conjunctivae and EOM are normal. Pupils are equal, round, and reactive to light. Right eye exhibits no discharge. Left eye exhibits no discharge. No scleral icterus.  Neck: Normal range of motion. Neck supple. No JVD present. No tracheal deviation present. No thyromegaly present.  Cardiovascular: Normal rate, regular rhythm, normal heart sounds and intact distal pulses.  Exam reveals no gallop and no friction rub.   No murmur heard. Pulmonary/Chest: Effort normal and breath sounds normal. No respiratory distress. She has no wheezes. She has no rales. She exhibits no tenderness. Right breast exhibits no inverted nipple,  no mass, no nipple discharge, no skin change and no tenderness. Left breast exhibits no inverted nipple, no mass, no nipple discharge, no skin change and no tenderness. Breasts are symmetrical.  Abdominal: Soft. Bowel sounds are normal. She exhibits no distension and no mass. There is no tenderness. There is no rebound and no guarding.  Genitourinary:  Deferred due to pap being due in 2018  Musculoskeletal: Normal range of motion. She exhibits no edema or tenderness.  Lymphadenopathy:    She has no cervical adenopathy.  Neurological: She is alert and oriented to person, place, and time.  Skin: Skin is warm and dry. No rash noted. She is not diaphoretic.  Psychiatric: She has a normal mood and affect. Her behavior is normal. Judgment and thought content normal.  Vitals reviewed.    Depression Screen PHQ 2/9 Scores 04/23/2015 09/09/2014  PHQ - 2 Score 0 0      Assessment & Plan:     Routine Health Maintenance and Physical Exam  Exercise Activities and Dietary recommendations Goals    None      Immunization  History  Administered Date(s) Administered  . Tdap 06/28/2005  . Zoster 08/16/2013    Health Maintenance  Topic Date Due  . Hepatitis C Screening  [birth year]  . PNEUMOCOCCAL POLYSACCHARIDE VACCINE (1) 09/01/1962  . FOOT EXAM  09/01/1970  . OPHTHALMOLOGY EXAM  09/01/1970  . HIV Screening  09/01/1975  . TETANUS/TDAP  06/29/2015  . HEMOGLOBIN A1C  09/22/2015  . INFLUENZA VACCINE  09/30/2015  . PAP SMEAR  07/12/2016  . MAMMOGRAM  10/28/2016  . COLONOSCOPY  10/12/2018      Discussed health benefits of physical activity, and encouraged her to engage in regular exercise appropriate for her age and condition.   1. Annual physical exam Normal physical exam today. Will check labs as below and f/u pending lab results. If labs are stable and WNL she will not need to have these rechecked for one year at her next annual physical exam. She is to call the office in the meantime if she has any acute issue, questions or concerns. - CBC with Differential - Comprehensive metabolic panel - TSH  2. Breast cancer screening Breast exam today was normal. There is no family history of breast cancer. She does perform regular self breast exams. Mammogram was ordered as below. Information for [facility] was given to patient so she may schedule her mammogram at her convenience. - Mammogram Digital Screening; Future  3. Type 2 diabetes mellitus without complication, without long-term current use of insulin ([place]) Will recheck HgBA1c and follow up pending results. Will see her back in 6 months to recheck. - Hemoglobin A1c  4. Anemia, unspecified anemia type Previously stable. Will check labs and f/u pending results. - CBC with Differential  5. Hypercholesteremia Will check labs and f/u pending results. - Lipid panel  6. Avitaminosis D Stable on OTC VitD supplement. WIll check labs and f/u pending results. - Vitamin D (25 hydroxy)  7. Gastroesophageal reflux disease with esophagitis Not  controlled with prilosec and having increasing epigastric discomfort after eating certain foods. Also now on Celebrex as well that may cause NSAID induced gastritis. Will switch to nexium as below. She is to call if no improvement, symptoms worsen or if she has adverse reaction to medication. - esomeprazole (NEXIUM) 20 MG capsule; Take 1 capsule (20 mg total) by mouth daily at 12 noon.  Dispense: 30 capsule; Refill: 0  8. Venous insufficiency of both lower  extremities Followed by Dr. Ronalee Belts. No intervention currently. Conservative treatment with compression stockings and elevation when she can.  9. Plantar fasciitis, bilateral Followed by Dr. Milinda Pointer.   --------------------------------------------------------------------

## 2015-08-06 NOTE — Patient Instructions (Addendum)
Health Maintenance, Female Adopting a healthy lifestyle and getting preventive care can go a long way to promote health and wellness. Talk with your health care provider about what schedule of regular examinations is right for you. This is a good chance for you to check in with your provider about disease prevention and staying healthy. In between checkups, there are plenty of things you can do on your own. Experts have done a lot of research about which lifestyle changes and preventive measures are most likely to keep you healthy. Ask your health care provider for more information. WEIGHT AND DIET  Eat a healthy diet  Be sure to include plenty of vegetables, fruits, low-fat dairy products, and lean protein.  Do not eat a lot of foods high in solid fats, added sugars, or salt.  Get regular exercise. This is one of the most important things you can do for your health.  Most adults should exercise for at least 150 minutes each week. The exercise should increase your heart rate and make you sweat (moderate-intensity exercise).  Most adults should also do strengthening exercises at least twice a week. This is in addition to the moderate-intensity exercise.  Maintain a healthy weight  Body mass index (BMI) is a measurement that can be used to identify possible weight problems. It estimates body fat based on height and weight. Your health care provider can help determine your BMI and help you achieve or maintain a healthy weight.  For females 28 years of age and older:   A BMI below 18.5 is considered underweight.  A BMI of 18.5 to 24.9 is normal.  A BMI of 25 to 29.9 is considered overweight.  A BMI of 30 and above is considered obese.  Watch levels of cholesterol and blood lipids  You should start having your blood tested for lipids and cholesterol at 55 years of age, then have this test every 5 years.  You may need to have your cholesterol levels checked more often if:  Your lipid  or cholesterol levels are high.  You are older than 55 years of age.  You are at high risk for heart disease.  CANCER SCREENING   Lung Cancer  Lung cancer screening is recommended for adults 75-66 years old who are at high risk for lung cancer because of a history of smoking.  A yearly low-dose CT scan of the lungs is recommended for people who:  Currently smoke.  Have quit within the past 15 years.  Have at least a 30-pack-year history of smoking. A pack year is smoking an average of one pack of cigarettes a day for 1 year.  Yearly screening should continue until it has been 15 years since you quit.  Yearly screening should stop if you develop a health problem that would prevent you from having lung cancer treatment.  Breast Cancer  Practice breast self-awareness. This means understanding how your breasts normally appear and feel.  It also means doing regular breast self-exams. Let your health care provider know about any changes, no matter how small.  If you are in your 20s or 30s, you should have a clinical breast exam (CBE) by a health care provider every 1-3 years as part of a regular health exam.  If you are 25 or older, have a CBE every year. Also consider having a breast X-ray (mammogram) every year.  If you have a family history of breast cancer, talk to your health care provider about genetic screening.  If you  are at high risk for breast cancer, talk to your health care provider about having an MRI and a mammogram every year.  Breast cancer gene (BRCA) assessment is recommended for women who have family members with BRCA-related cancers. BRCA-related cancers include:  Breast.  Ovarian.  Tubal.  Peritoneal cancers.  Results of the assessment will determine the need for genetic counseling and BRCA1 and BRCA2 testing. Cervical Cancer Your health care provider may recommend that you be screened regularly for cancer of the pelvic organs (ovaries, uterus, and  vagina). This screening involves a pelvic examination, including checking for microscopic changes to the surface of your cervix (Pap test). You may be encouraged to have this screening done every 3 years, beginning at age 21.  For women ages 30-65, health care providers may recommend pelvic exams and Pap testing every 3 years, or they may recommend the Pap and pelvic exam, combined with testing for human papilloma virus (HPV), every 5 years. Some types of HPV increase your risk of cervical cancer. Testing for HPV may also be done on women of any age with unclear Pap test results.  Other health care providers may not recommend any screening for nonpregnant women who are considered low risk for pelvic cancer and who do not have symptoms. Ask your health care provider if a screening pelvic exam is right for you.  If you have had past treatment for cervical cancer or a condition that could lead to cancer, you need Pap tests and screening for cancer for at least 20 years after your treatment. If Pap tests have been discontinued, your risk factors (such as having a new sexual partner) need to be reassessed to determine if screening should resume. Some women have medical problems that increase the chance of getting cervical cancer. In these cases, your health care provider may recommend more frequent screening and Pap tests. Colorectal Cancer  This type of cancer can be detected and often prevented.  Routine colorectal cancer screening usually begins at 55 years of age and continues through 55 years of age.  Your health care provider may recommend screening at an earlier age if you have risk factors for colon cancer.  Your health care provider may also recommend using home test kits to check for hidden blood in the stool.  A small camera at the end of a tube can be used to examine your colon directly (sigmoidoscopy or colonoscopy). This is done to check for the earliest forms of colorectal  cancer.  Routine screening usually begins at age 50.  Direct examination of the colon should be repeated every 5-10 years through 55 years of age. However, you may need to be screened more often if early forms of precancerous polyps or small growths are found. Skin Cancer  Check your skin from head to toe regularly.  Tell your health care provider about any new moles or changes in moles, especially if there is a change in a mole's shape or color.  Also tell your health care provider if you have a mole that is larger than the size of a pencil eraser.  Always use sunscreen. Apply sunscreen liberally and repeatedly throughout the day.  Protect yourself by wearing long sleeves, pants, a wide-brimmed hat, and sunglasses whenever you are outside. HEART DISEASE, DIABETES, AND HIGH BLOOD PRESSURE   High blood pressure causes heart disease and increases the risk of stroke. High blood pressure is more likely to develop in:  People who have blood pressure in the high end   of the normal range (130-139/85-89 mm Hg).  People who are overweight or obese.  People who are African American.  If you are 72-43 years of age, have your blood pressure checked every 3-5 years. If you are 63 years of age or older, have your blood pressure checked every year. You should have your blood pressure measured twice--once when you are at a hospital or clinic, and once when you are not at a hospital or clinic. Record the average of the two measurements. To check your blood pressure when you are not at a hospital or clinic, you can use:  An automated blood pressure machine at a pharmacy.  A home blood pressure monitor.  If you are between 97 years and 7 years old, ask your health care provider if you should take aspirin to prevent strokes.  Have regular diabetes screenings. This involves taking a blood sample to check your fasting blood sugar level.  If you are at a normal weight and have a low risk for diabetes,  have this test once every three years after 55 years of age.  If you are overweight and have a high risk for diabetes, consider being tested at a younger age or more often. PREVENTING INFECTION  Hepatitis B  If you have a higher risk for hepatitis B, you should be screened for this virus. You are considered at high risk for hepatitis B if:  You were born in a country where hepatitis B is common. Ask your health care provider which countries are considered high risk.  Your parents were born in a high-risk country, and you have not been immunized against hepatitis B (hepatitis B vaccine).  You have HIV or AIDS.  You use needles to inject street drugs.  You live with someone who has hepatitis B.  You have had sex with someone who has hepatitis B.  You get hemodialysis treatment.  You take certain medicines for conditions, including cancer, organ transplantation, and autoimmune conditions. Hepatitis C  Blood testing is recommended for:  Everyone born from [birth year] through 1965.  Anyone with known risk factors for hepatitis C. Sexually transmitted infections (STIs)  You should be screened for sexually transmitted infections (STIs) including gonorrhea and chlamydia if:  You are sexually active and are younger than 55 years of age.  You are older than 55 years of age and your health care provider tells you that you are at risk for this type of infection.  Your sexual activity has changed since you were last screened and you are at an increased risk for chlamydia or gonorrhea. Ask your health care provider if you are at risk.  If you do not have HIV, but are at risk, it may be recommended that you take a prescription medicine daily to prevent HIV infection. This is called pre-exposure prophylaxis (PrEP). You are considered at risk if:  You are sexually active and do not regularly use condoms or know the HIV status of your partner(s).  You take drugs by injection.  You are sexually  active with a partner who has HIV. Talk with your health care provider about whether you are at high risk of being infected with HIV. If you choose to begin PrEP, you should first be tested for HIV. You should then be tested every 3 months for as long as you are taking PrEP.  PREGNANCY   If you are premenopausal and you may become pregnant, ask your health care provider about preconception counseling.  If you may  become pregnant, take 400 to 800 micrograms (mcg) of folic acid every day.  If you want to prevent pregnancy, talk to your health care provider about birth control (contraception). OSTEOPOROSIS AND MENOPAUSE   Osteoporosis is a disease in which the bones lose minerals and strength with aging. This can result in serious bone fractures. Your risk for osteoporosis can be identified using a bone density scan.  If you are 40 years of age or older, or if you are at risk for osteoporosis and fractures, ask your health care provider if you should be screened.  Ask your health care provider whether you should take a calcium or vitamin D supplement to lower your risk for osteoporosis.  Menopause may have certain physical symptoms and risks.  Hormone replacement therapy may reduce some of these symptoms and risks. Talk to your health care provider about whether hormone replacement therapy is right for you.  HOME CARE INSTRUCTIONS   Schedule regular health, dental, and eye exams.  Stay current with your immunizations.   Do not use any tobacco products including cigarettes, chewing tobacco, or electronic cigarettes.  If you are pregnant, do not drink alcohol.  If you are breastfeeding, limit how much and how often you drink alcohol.  Limit alcohol intake to no more than 1 drink per day for nonpregnant women. One drink equals 12 ounces of beer, 5 ounces of wine, or 1 ounces of hard liquor.  Do not use street drugs.  Do not share needles.  Ask your health care provider for help if  you need support or information about quitting drugs.  Tell your health care provider if you often feel depressed.  Tell your health care provider if you have ever been abused or do not feel safe at home.   This information is not intended to replace advice given to you by your health care provider. Make sure you discuss any questions you have with your health care provider.   Document Released: 08/31/2010 Document Revised: 03/08/2014 Document Reviewed: 01/17/2013 Elsevier Interactive Patient Education 2016 [employer]  Esomeprazole capsules What is this medicine? ESOMEPRAZOLE (es oh ME pray zol) prevents the production of acid in the stomach. It is used to treat gastroesophageal reflux disease (GERD), ulcers, certain bacteria in the stomach, and inflammation of the esophagus. It can also be used to prevent ulcers in patients taking medicines called NSAIDs. You can also buy this medicine without a prescription to treat the symptoms of heartburn. The non-prescription product is not for long-term use, unless otherwise directed by your doctor or health care professional. This medicine may be used for other purposes; ask your health care provider or pharmacist if you have questions. What should I tell my health care provider before I take this medicine? They need to know if you have any of these conditions: -bloody or black, tarry stools -chest pain -have had heartburn for over 3 months -have heartburn with dizziness, lightheadedness, or sweating -liver disease -low levels of magnesium in the blood -nausea, vomiting -stomach pain -trouble swallowing -unexplained weight loss -vomiting with blood -wheezing -an unusual or allergic reaction to esomeprazole, other medicines, foods, dyes, or preservatives -pregnant or trying to get pregnant -breast-feeding How should I use this medicine? Take this medicine by mouth. Swallow the capsules whole with a drink of water. Follow the directions on  the prescription or product label. Do not crush, break or chew. The capsules can be opened and the contents sprinkled on applesauce. Do not crush the contents into  the food. This medicine works best if taken on an empty stomach at least one hour before a meal. Take your medicine at regular intervals. Do not take your medicine more often than directed. Talk to your pediatrician regarding the use of this medicine in children. Special care may be needed. Overdosage: If you think you have taken too much of this medicine contact a poison control center or emergency room at once. NOTE: This medicine is only for you. Do not share this medicine with others. What if I miss a dose? If you miss a dose, take it as soon as you can. If it is almost time for your next dose, take only that dose. Do not take double or extra doses. What may interact with this medicine? Do not take this medicine with any of the following medications: -atazanavir This medicine may also interact with the following medications: -ampicillin -antiviral medicines for HIV or AIDS -certain medicines for fungal infections like ketoconazole and itraconazole -cilostazol -clopidogrel -diazepam -digoxin -erlotinib -diuretics -iron salts -methotrexate -St. John's Wort -tacrolimus -rifampin -warfarin This list may not describe all possible interactions. Give your health care provider a list of all the medicines, herbs, non-prescription drugs, or dietary supplements you use. Also tell them if you smoke, drink alcohol, or use illegal drugs. Some items may interact with your medicine. What should I watch for while using this medicine? If you are taking this medicine without a prescription, it may take 1 to 4 days for it to fully relieve your heartburn. If you are using this medicine with a prescription from your healthcare professional for a more serious condition, it can take several days before your condition gets better. Check with your  doctor or health care professional if your condition does not start to get better, or if it gets worse. If you take this medicine for long periods of time, you may need blood work done. What side effects may I notice from receiving this medicine? Side effects that you should report to your doctor or health care professional as soon as possible: -allergic reactions like skin rash, itching or hives, swelling of the face, lips, or tongue -bone, muscle or joint pain -breathing problems -chest pain or chest tightness -dark yellow or brown urine -fast, irregular heartbeat -feeling faint or lightheaded -fever or sore throat -muscle spasms -tremors -unusual bleeding or bruising -unusually weak or tired -upset stomach -yellowing of the eyes or skin Side effects that usually do not require medical attention (Report these to your doctor or health care professional if they continue or are bothersome.): -constipation -diarrhea -dry mouth -headache -nausea -stomach pain or gas -vomiting This list may not describe all possible side effects. Call your doctor for medical advice about side effects. You may report side effects to FDA at 1-800-FDA-1088. Where should I keep my medicine? Keep out of the reach of children. Store at room temperature between 15 and 30 degrees C (59 and 86 degrees F). Protect from light and moisture. Throw away any unused medicine after the expiration date. NOTE: This sheet is a summary. It may not cover all possible information. If you have questions about this medicine, talk to your doctor, pharmacist, or health care provider.    2016, Elsevier/[employer] Standard. (2012-05-30 14:58:20)

## 2015-08-07 ENCOUNTER — Telehealth: Payer: Self-pay | Admitting: *Deleted

## 2015-08-07 NOTE — Telephone Encounter (Signed)
"  We received your request for prior authorization.  It's regarding Cpt [id number], 2 units.  According to her benefits for this plan, authorization is not required."

## 2015-08-08 ENCOUNTER — Other Ambulatory Visit
Admission: RE | Admit: 2015-08-08 | Discharge: 2015-08-08 | Disposition: A | Discharge status: Home / Self Care | Payer: 59 | Source: Ambulatory Visit | Attending: Physician Assistant | Admitting: Physician Assistant

## 2015-08-08 DIAGNOSIS — Z0000 Encounter for general adult medical examination without abnormal findings: Secondary | ICD-10-CM | POA: Diagnosis not present

## 2015-08-08 LAB — COMPREHENSIVE METABOLIC PANEL
ALBUMIN: 4 g/dL (ref 3.5–5.0)
ALT: 24 U/L (ref 14–54)
ANION GAP: 9 (ref 5–15)
AST: 22 U/L (ref 15–41)
Alkaline Phosphatase: 77 U/L (ref 38–126)
BILIRUBIN TOTAL: 0.3 mg/dL (ref 0.3–1.2)
BUN: 13 mg/dL (ref 6–20)
CHLORIDE: 103 mmol/L (ref 101–111)
CO2: 25 mmol/L (ref 22–32)
Calcium: 8.8 mg/dL — ABNORMAL LOW (ref 8.9–10.3)
Creatinine, Ser: 0.51 mg/dL (ref 0.44–1.00)
GFR calc Af Amer: >60 mL/min (ref >60)
GFR calc non Af Amer: >60 mL/min (ref >60)
GLUCOSE: 136 mg/dL — AB (ref 65–99)
POTASSIUM: 4 mmol/L (ref 3.5–5.1)
SODIUM: 137 mmol/L (ref 135–145)
TOTAL PROTEIN: 6.8 g/dL (ref 6.5–8.1)

## 2015-08-08 LAB — CBC WITH DIFFERENTIAL/PLATELET
BASOS ABS: 0.1 10*3/uL (ref 0–0.1)
BASOS PCT: 1 %
EOS ABS: 0.1 10*3/uL (ref 0–0.7)
EOS PCT: 2 %
HCT: 36.3 % (ref 35.0–47.0)
Hemoglobin: 11.6 g/dL — ABNORMAL LOW (ref 12.0–16.0)
Lymphocytes Relative: 22 %
Lymphs Abs: 1.2 10*3/uL (ref 1.0–3.6)
MCH: 24.6 pg — ABNORMAL LOW (ref 26.0–34.0)
MCHC: 31.9 g/dL — AB (ref 32.0–36.0)
MCV: 77.1 fL — ABNORMAL LOW (ref 80.0–100.0)
MONO ABS: 0.5 10*3/uL (ref 0.2–0.9)
MONOS PCT: 9 %
Neutro Abs: 3.6 10*3/uL (ref 1.4–6.5)
Neutrophils Relative %: 66 %
PLATELETS: 278 10*3/uL (ref 150–440)
RBC: 4.7 MIL/uL (ref 3.80–5.20)
RDW: 17.1 % — AB (ref 11.5–14.5)
WBC: 5.4 10*3/uL (ref 3.6–11.0)

## 2015-08-08 LAB — LIPID PANEL
CHOL/HDL RATIO: 4 ratio
Cholesterol: 202 mg/dL — ABNORMAL HIGH (ref 0–200)
HDL: 51 mg/dL (ref >40)
LDL CALC: 117 mg/dL — AB (ref 0–99)
TRIGLYCERIDES: 168 mg/dL — AB (ref <150)
VLDL: 34 mg/dL (ref 0–40)

## 2015-08-08 LAB — TSH: TSH: 1.954 u[IU]/mL (ref 0.350–4.500)

## 2015-08-08 LAB — HEMOGLOBIN A1C: Hgb A1c MFr Bld: 6.6 % — ABNORMAL HIGH (ref 4.0–6.0)

## 2015-08-09 LAB — VITAMIN D 25 HYDROXY (VIT D DEFICIENCY, FRACTURES): VIT D 25 HYDROXY: 23.7 ng/mL — AB (ref 30.0–100.0)

## 2015-08-11 ENCOUNTER — Telehealth: Payer: Self-pay

## 2015-08-11 ENCOUNTER — Encounter: Payer: Self-pay | Admitting: Physician Assistant

## 2015-08-11 MED ORDER — VITAMIN D (ERGOCALCIFEROL) 1.25 MG (50000 UNIT) PO CAPS: 50000.0000 [IU] | ORAL_CAPSULE | ORAL | Status: DC

## 2015-08-11 NOTE — Telephone Encounter (Signed)
Patient advised as directed below.  Thanks,  -Joseline 

## 2015-08-11 NOTE — Telephone Encounter (Signed)
-----   Message from Mar Daring, [place] sent at 08/11/2015 10:27 AM EDT ----- Labs are stable. HgBA1c and cholesterol are improving. Vitamin D is low. Will send in Rx for vit d supplement. Will recheck in 6 months.

## 2015-08-11 NOTE — Addendum Note (Signed)
Addended by: Mar Daring on: 08/11/2015 10:30 AM   Modules accepted: Orders

## 2015-08-12 ENCOUNTER — Ambulatory Visit
Admission: RE | Admit: 2015-08-12 | Discharge: 2015-08-12 | Disposition: A | Discharge status: Home / Self Care | Payer: 59 | Source: Ambulatory Visit | Attending: Podiatry | Admitting: Podiatry

## 2015-08-12 ENCOUNTER — Telehealth: Payer: Self-pay | Admitting: *Deleted

## 2015-08-12 DIAGNOSIS — M722 Plantar fascial fibromatosis: Secondary | ICD-10-CM | POA: Insufficient documentation

## 2015-08-12 DIAGNOSIS — M659 Synovitis and tenosynovitis, unspecified: Secondary | ICD-10-CM | POA: Diagnosis not present

## 2015-08-12 DIAGNOSIS — R600 Localized edema: Secondary | ICD-10-CM | POA: Diagnosis not present

## 2015-08-12 NOTE — Telephone Encounter (Addendum)
-----   Message from Garrel Ridgel, [place] sent at 08/12/2015 12:35 PM EDT ----- Have her in.  Informed pt of Dr. Stephenie Acres orders and pt states has a July appt, but wants to see if has a sooner appt.  Transferred to schedulers.

## 2015-08-18 ENCOUNTER — Ambulatory Visit (INDEPENDENT_AMBULATORY_CARE_PROVIDER_SITE_OTHER): Payer: 59 | Admitting: Podiatry

## 2015-08-18 ENCOUNTER — Encounter: Payer: Self-pay | Admitting: Podiatry

## 2015-08-18 DIAGNOSIS — M722 Plantar fascial fibromatosis: Secondary | ICD-10-CM

## 2015-08-18 MED ORDER — TRAMADOL HCL 50 MG PO TABS: 50.0000 mg | ORAL_TABLET | Freq: Three times a day (TID) | ORAL | Status: DC | PRN

## 2015-08-18 NOTE — Progress Notes (Signed)
She presents today for a repeat of her MRIs. She states that the Celebrex that she is currently on seems to be doing much better lessening symptoms of pain to the dorsal aspect of the right foot and to the left heel.  Objective: No change in physical exam dorsal aspect of the right foot is still painful as well as the lateral aspect of the right foot lateral and plantar medial aspect of the left are also painful. MRI reviewed with her today discussing a tear to the left heel along the medial band of plantar fascia and the lack of healing to the plantar fascia of the right heel where an endoscopic fasciotomy was performed. 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  Assessment: Plantar fasciitis with compensatory syndrome bilateral.  Plan: I encouraged her to continue her Celebrex however I don't want her to take more than she has prescribed because of her epigastric pain. I also feel tramadol or another light narcotic may be a necessity to help break the pain cycle. I will follow-up with her in 3-4 weeks or longer if needed. She states that she is unable to do any type of surgery at this point.

## 2015-09-03 ENCOUNTER — Ambulatory Visit: Payer: 59 | Admitting: Podiatry

## 2015-09-15 ENCOUNTER — Ambulatory Visit: Payer: 59 | Admitting: Podiatry

## 2015-09-22 ENCOUNTER — Encounter: Payer: Self-pay | Admitting: Podiatry

## 2015-09-22 ENCOUNTER — Ambulatory Visit (INDEPENDENT_AMBULATORY_CARE_PROVIDER_SITE_OTHER): Payer: 59 | Admitting: Podiatry

## 2015-09-22 ENCOUNTER — Ambulatory Visit: Payer: 59 | Admitting: Podiatry

## 2015-09-22 DIAGNOSIS — G5761 Lesion of plantar nerve, right lower limb: Secondary | ICD-10-CM

## 2015-09-22 DIAGNOSIS — M722 Plantar fascial fibromatosis: Secondary | ICD-10-CM | POA: Diagnosis not present

## 2015-09-22 DIAGNOSIS — G5792 Unspecified mononeuropathy of left lower limb: Secondary | ICD-10-CM

## 2015-09-22 NOTE — Progress Notes (Signed)
She presents today for follow-up of her plantar fasciitis in her bilateral heel. And the neuroma to the right forefoot. She states that the right heel is doing much better the left heel is still painful.  Objective: Palpable Mulder's click third interdigital space of the right foot with pain. Pulses remain palpable bilateral. Minimal pain on palpation medially continue to go the right heel however the left feels it is exquisitely painful.  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  Plan: I injected the areas today third interdigital space right and the left heel with dehydrated alcohol. I will follow up with her in 3-4 weeks for her second dose. She would like to consider EPAT  Dr. Roselind Messier

## 2015-09-29 ENCOUNTER — Other Ambulatory Visit: Payer: Self-pay | Admitting: Physician Assistant

## 2015-09-29 ENCOUNTER — Other Ambulatory Visit: Payer: Self-pay | Admitting: Podiatry

## 2015-09-30 ENCOUNTER — Telehealth: Payer: Self-pay | Admitting: *Deleted

## 2015-09-30 NOTE — Telephone Encounter (Signed)
Pt needs an appt prior to future refills. 

## 2015-09-30 NOTE — Telephone Encounter (Signed)
Called Tramadol to [place].

## 2015-10-03 ENCOUNTER — Encounter: Payer: Self-pay | Admitting: *Deleted

## 2015-10-13 ENCOUNTER — Ambulatory Visit: Payer: 59 | Admitting: Podiatry

## 2015-10-22 ENCOUNTER — Encounter: Payer: Self-pay | Admitting: Podiatry

## 2015-10-22 ENCOUNTER — Encounter: Payer: Self-pay | Admitting: Pharmacist

## 2015-10-22 ENCOUNTER — Other Ambulatory Visit: Payer: Self-pay | Admitting: Pharmacist

## 2015-10-22 ENCOUNTER — Ambulatory Visit (INDEPENDENT_AMBULATORY_CARE_PROVIDER_SITE_OTHER): Payer: 59 | Admitting: Podiatry

## 2015-10-22 DIAGNOSIS — G5761 Lesion of plantar nerve, right lower limb: Secondary | ICD-10-CM | POA: Diagnosis not present

## 2015-10-22 DIAGNOSIS — G5792 Unspecified mononeuropathy of left lower limb: Secondary | ICD-10-CM

## 2015-10-22 NOTE — Progress Notes (Signed)
She presents today for follow-up of her bilateral foot pain. She states that her neuroma to the third interdigital space of the right foot is mildly better however her right heel is mildly worse area the left heel is still painful.  Objective: Vital signs are stable alert and oriented 3 pulses are palpable. Neurologic sensorium is intact. Deep tendon reflexes are intact. She has palpable Mulder click third interspace right foot. Left heel has plantar fasciitis neuritis.  Assessment: Neuroma third interdigital space right foot. And neuritis with plantar fasciitis left foot.  Plan: Reinjected today dehydrated alcohol both feet. Follow up with her in 3 weeks

## 2015-10-23 NOTE — Patient Outreach (Signed)
[facility]) Care Management  [place]   10/23/2015  Miranda Huang 1960-11-30 [id number]  Subjective: Miranda Huang is here for her [facility] To [facility]ness diabetes visit. She is having pain in both feet due to plantar fasciitis and has not been able to exercise due to the pain. She is current with her dental and eye exams. She admits to not checking her blood sugar frequently.  Objective:  Vitals:   10/22/15 1645  BP: 124/68  Weight: 215 lb (97.5 kg)  Height: 1.651 m ('5\' 5"' )   Labs: A1c: 6.6% 08/08/15 Lipid Panel: TC = 202 mg/dl; TG = 168 mg/dl; LDL = 117 mg/dl; HDL = 51 mg/dl (08/08/15)  Encounter Medications: Outpatient Encounter Prescriptions as of 10/22/2015  Medication Sig Note  . ALPRAZolam (XANAX) 0.25 MG tablet Take 1 tablet (0.25 mg total) by mouth at bedtime as needed for anxiety.   . Biotin 5000 MCG TABS Take 1 tablet by mouth daily.   . celecoxib (CELEBREX) 200 MG capsule Take 1 capsule (200 mg total) by mouth daily.   Marland Kitchen escitalopram (LEXAPRO) 10 MG tablet TAKE 1 TO 2 TABLETS BY MOUTH DAILY.   Marland Kitchen esomeprazole (NEXIUM) 20 MG capsule Take 1 capsule (20 mg total) by mouth daily at 12 noon.   . furosemide (LASIX) 20 MG tablet Take 1 tablet by mouth daily as needed. Reported on 08/06/2015 07/09/2014: Medication taken as needed.  Received from: [place]:   . gabapentin (NEURONTIN) 100 MG capsule Take 1 capsule (100 mg total) by mouth at bedtime.   Marland Kitchen glucose blood test strip BAYER CONTOUR TEST (In Vitro Strip)  1 (one) Strip two times daily, and as needed for 0 days  Quantity: 100;  Refills: 12   Ordered :11-Jun-2014  Althea Charon ;  Started 11-Jun-2014 Active Comments: Medication taken as needed. 07/09/2014: Medication taken as needed.  Received from: [facility]  . hydrochlorothiazide (HYDRODIURIL) 25 MG tablet Take 1 tablet by mouth daily. 07/09/2014: Received from: [place]:   .  lisinopril (PRINIVIL,ZESTRIL) 5 MG tablet TAKE 1 TABLET BY MOUTH ONCE DAILY   . metFORMIN (GLUCOPHAGE-XR) 500 MG 24 hr tablet TAKE 2 TABLETS BY MOUTH WITH BREAKFAST (Patient taking differently: Take 1 tablet in the morning and 1 at bedtime)   . Misc Natural Products (BLACK COHOSH MENOPAUSE COMPLEX PO) Take 1 tablet by mouth daily. Menosense formulation   . Probiotic CAPS Take 1 capsule by mouth daily. 07/09/2014: Received from: [place]:   . traMADol (ULTRAM) 50 MG tablet TAKE ONE TABLET BY MOUTH EVERY 8 HOURS AS NEEDED   . Vitamin D, Ergocalciferol, (DRISDOL) 50000 units CAPS capsule Take 1 capsule (50,000 Units total) by mouth every 7 (seven) days.   . [DISCONTINUED] albuterol (PROVENTIL HFA;VENTOLIN HFA) 108 (90 BASE) MCG/ACT inhaler Inhale 2 puffs into the lungs every 4 (four) hours as needed for wheezing or shortness of breath. Reported on 02/20/2015 (Patient not taking: Reported on 10/22/2015)   . [DISCONTINUED] cetirizine (ZYRTEC) 10 MG tablet Take 1 tablet by mouth daily. 07/09/2014: Received from: [place]:   . [DISCONTINUED] chlorpheniramine-HYDROcodone (TUSSIONEX PENNKINETIC ER) 10-8 MG/5ML SUER Take 5 mLs by mouth every 12 (twelve) hours as needed for cough. (Patient not taking: Reported on 04/23/2015)   . [DISCONTINUED] diclofenac (VOLTAREN) 75 MG EC tablet Take 1 tablet (75 mg total) by mouth 2 (two) times daily. (Patient not taking: Reported on 04/23/2015)  No facility-administered encounter medications on file as of 10/22/2015.     Functional Status: In your present state of health, do you have any difficulty performing the following activities: 10/22/2015 04/23/2015  Hearing? N N  Vision? N N  Difficulty concentrating or making decisions? N N  Walking or climbing stairs? N N  Dressing or bathing? N N  Doing errands, shopping? N N  Some recent data might be hidden    Fall/Depression Screening: PHQ 2/9 Scores  10/22/2015 04/23/2015 09/09/2014  PHQ - 2 Score 0 0 0    [facility] CM Care Plan Problem One   Flowsheet Row Most Recent Value  Care Plan Problem One  Maintain A1c below 7%  Role Documenting the Problem One  Clinical Pharmacist  Care Plan for Problem One  Active  [facility] Long Term Goal (31-90 days)  Patient will maintain A1c below 7% over the next 90 days, evidenced by patient report  [facility] Long Term Goal Start Date  10/22/15  [facility] Long Term Goal Met Date  08/08/15  Interventions for Problem One Long Term Goal  Patient will keep all physician appointments and exercise as tolerated    [facility] CM Care Plan Problem Two   Flowsheet Row Most Recent Value  Care Plan Problem Two  Patient needs a routine dental exam  Role Documenting the Problem Two  Clinical Pharmacist  Care Plan for Problem Two  Not Active  Interventions for Problem Two Long Term Goal   Encouraged patient to make her dental exam within the next 90 days.  [facility]N Long Term Goal (31-90) days  Patient will call her dentist office to make an appointment within the next 90 days, evidenced by patient report  [facility] [place] [facility] Long Term Goal Start Date  04/23/15  [facility] Long Term Goal Met Date  05/23/15    [facility] CM Care Plan Problem Three   Flowsheet Row Most Recent Value  Care Plan Problem Three  Patient had stopped exercising post foot surgery in 2016  Role Documenting the Problem Three  Clinical Pharmacist  Care Plan for Problem Three  Not Active  [facility]N Long Term Goal (31-90) days  Patient will exercise 3 times per week for 30 minutes per session as evidenced by patient report  [facility] Term Goal Start Date  04/23/15  Interventions for Problem Three Long Term Goal  Encouraged patient to start back with her exercise at the gym or walking      Assessment: 1. Diabetes- A1c within goal of less than 7%. Patient is not able to exercise due to plantar fasciitis in both feet. 2. Cholesterol-  TC not at goal of less than 200 mg/dl, TG not at goal of less than 150 mg/dl, LDL  not at goal of less than 100 mg/dl; HDL at goal of greater than 50 mg/dl. Patient encouraged to discuss the addition of a statin at next PCP visit.  Plan: 1. Diabetes/Cholesterol-  Patient will follow up with primary care provider 02/05/16 and will discuss the addition of aspirin and a statin. Based on patients age and risk factors, recommend high intensity statin. Encouraged patient to check her fasting blood sugar 2-3 times per week. Encouraged patient to obtain new glucometer (True Metrix) from the [facility] employee pharmacy. 2. Link To Wellness- patient will follow up in 6 months. Pharmacist to call patient in 3 months to update A1c.   Mclain Freer K. Dicky Doe, PharmD [facility] [phone]

## 2015-10-24 ENCOUNTER — Telehealth: Payer: Self-pay | Admitting: Physician Assistant

## 2015-10-24 DIAGNOSIS — K219 Gastro-esophageal reflux disease without esophagitis: Secondary | ICD-10-CM

## 2015-10-24 MED ORDER — ESOMEPRAZOLE MAGNESIUM 40 MG PO CPDR: 40.0000 mg | DELAYED_RELEASE_CAPSULE | Freq: Every day | ORAL | 1 refills | Status: DC

## 2015-10-24 NOTE — Telephone Encounter (Signed)
Patient advised RX has been sent to pharmacy  

## 2015-10-24 NOTE — Telephone Encounter (Signed)
Sent in Nexium 40mg 

## 2015-10-24 NOTE — Telephone Encounter (Signed)
Pt states that the [facility] advised her that the 40mg  dosage for Nexium would be covered by insurance/MW

## 2015-10-24 NOTE — Telephone Encounter (Signed)
Pt states the insurance will not cover the Rx for esomeprazole (NEXIUM) 20 MG capsule.  Pt is asking if we up the dose or change to something else.[id number]

## 2015-11-10 ENCOUNTER — Other Ambulatory Visit: Payer: Self-pay | Admitting: Podiatry

## 2015-11-10 ENCOUNTER — Other Ambulatory Visit: Payer: Self-pay | Admitting: Physician Assistant

## 2015-11-10 DIAGNOSIS — F419 Anxiety disorder, unspecified: Secondary | ICD-10-CM

## 2015-11-17 ENCOUNTER — Encounter: Payer: Self-pay | Admitting: Podiatry

## 2015-11-17 ENCOUNTER — Ambulatory Visit (INDEPENDENT_AMBULATORY_CARE_PROVIDER_SITE_OTHER): Payer: 59 | Admitting: Podiatry

## 2015-11-17 ENCOUNTER — Ambulatory Visit: Payer: 59 | Admitting: Podiatry

## 2015-11-17 VITALS — Resp 16 | Ht 65.0 in | Wt 210.0 lb

## 2015-11-17 DIAGNOSIS — G5792 Unspecified mononeuropathy of left lower limb: Secondary | ICD-10-CM

## 2015-11-17 DIAGNOSIS — G5761 Lesion of plantar nerve, right lower limb: Secondary | ICD-10-CM

## 2015-11-17 NOTE — Progress Notes (Signed)
She presents today states that she really didn't this weekend and she states she was hiking to the mountains. He states that I overdid it with all the walking in the [place]. She states that I need injections today.  Objective: Vital signs are stable she is alert and oriented 3. Pulses are palpable. Palpable Mulder's click to the third interdigital space of the right heel. Pain on palpation left heel.  Plan: Injected another dose of dehydrated alcohol third andright and a larger dose of dehydrated alcohol to her left heel. Follow up with her in 1 month.

## 2015-11-19 NOTE — Telephone Encounter (Signed)
Pt needs a follow up appt

## 2015-12-03 ENCOUNTER — Other Ambulatory Visit: Payer: Self-pay | Admitting: Podiatry

## 2015-12-05 ENCOUNTER — Other Ambulatory Visit: Payer: Self-pay | Admitting: Physician Assistant

## 2015-12-05 NOTE — Telephone Encounter (Signed)
Pt needs an appt for follow up.

## 2015-12-08 ENCOUNTER — Ambulatory Visit (INDEPENDENT_AMBULATORY_CARE_PROVIDER_SITE_OTHER): Payer: 59 | Admitting: Podiatry

## 2015-12-08 ENCOUNTER — Encounter: Payer: Self-pay | Admitting: Podiatry

## 2015-12-08 DIAGNOSIS — M722 Plantar fascial fibromatosis: Secondary | ICD-10-CM | POA: Diagnosis not present

## 2015-12-08 DIAGNOSIS — G5761 Lesion of plantar nerve, right lower limb: Secondary | ICD-10-CM

## 2015-12-08 MED ORDER — CELECOXIB 200 MG PO CAPS: 200.0000 mg | ORAL_CAPSULE | Freq: Two times a day (BID) | ORAL | 5 refills | Status: DC

## 2015-12-08 MED ORDER — GABAPENTIN 300 MG PO CAPS: 300.0000 mg | ORAL_CAPSULE | Freq: Three times a day (TID) | ORAL | 3 refills | Status: DC

## 2015-12-09 NOTE — Progress Notes (Signed)
She presents today for follow-up of her neuroma to her right foot which she states is feeling some better however her bilateral heels are hurting much more because of the increased activity over the past couple of weeks. Her left heel has not been surgically corrected this is something she will consider in the winter.  Objective: Vital signs stable alert and oriented 3. Pulses are palpable. Neurologic sensorium is intact. Deep tendon reflexes are intact. She has pain on palpation third interdigital space of the right foot but much decreased from previous evaluation. She has moderate to severe pain on palpation medial calcaneal tubercle of the right heel which has had an endoscopic plantar fasciotomy performed in the past. The majority of the pain is central and lateral indicating central and lateral band plantar fasciitis as a post medial band plantar fasciitis. She has medial band pain on palpation to the left foot.  Assessment: Neuroma third interdigital space right foot. Plantar fasciitis central and lateral bands right foot. Medial band left foot.  Plan: At this point I injected the bilateral heels with Kenalog and local anesthetic just to help alleviate her symptoms at this point. I will follow-up with her in next few weeks once again discussed surgical consideration for the left foot.

## 2016-01-05 ENCOUNTER — Ambulatory Visit: Payer: 59 | Admitting: Podiatry

## 2016-01-21 ENCOUNTER — Ambulatory Visit: Payer: Self-pay | Admitting: Pharmacist

## 2016-01-21 ENCOUNTER — Encounter: Payer: Self-pay | Admitting: Physician Assistant

## 2016-01-21 ENCOUNTER — Ambulatory Visit: Payer: Self-pay | Admitting: Physician Assistant

## 2016-01-21 VITALS — BP 119/67 | HR 92 | Temp 97.3°F

## 2016-01-21 DIAGNOSIS — J069 Acute upper respiratory infection, unspecified: Secondary | ICD-10-CM

## 2016-01-21 MED ORDER — ALBUTEROL SULFATE HFA 108 (90 BASE) MCG/ACT IN AERS: 2.0000 | INHALATION_SPRAY | Freq: Four times a day (QID) | RESPIRATORY_TRACT | 0 refills | Status: DC | PRN

## 2016-01-21 MED ORDER — HYDROCOD POLST-CPM POLST ER 10-8 MG/5ML PO SUER: 5.0000 mL | Freq: Two times a day (BID) | ORAL | 0 refills | Status: DC | PRN

## 2016-01-21 MED ORDER — AZITHROMYCIN 250 MG PO TABS: ORAL_TABLET | ORAL | 0 refills | Status: DC

## 2016-01-21 NOTE — Progress Notes (Signed)
S: C/o cough and congestion for 1 days, no fever, chills, cp/sob, v/d; mucus was green this am but clear throughout the day, cough is sporadic, hx of bronchitis, states it feels the same as it always does prior to bronchitis  Using otc meds:cough drops  O: PE: vitals wnl, nad, perrl eomi, normocephalic, tms dull, nasal mucosa red and swollen, throat injected, neck supple no lymph, lungs c t a, cv rrr, neuro intact, cough is dry and hacking  A:  Acute uri   P: drink fluids, continue regular meds , use otc meds of choice, return if not improving in 5 days, return earlier if worsening , zpack , tussionex (pt states she can take both as has had before without any difficulty. Albuterol inhaler '

## 2016-01-28 ENCOUNTER — Ambulatory Visit: Payer: Self-pay | Admitting: Pharmacist

## 2016-01-29 ENCOUNTER — Other Ambulatory Visit: Payer: Self-pay | Admitting: Physician Assistant

## 2016-01-29 DIAGNOSIS — F32A Depression, unspecified: Secondary | ICD-10-CM

## 2016-01-29 DIAGNOSIS — F329 Major depressive disorder, single episode, unspecified: Secondary | ICD-10-CM

## 2016-02-04 ENCOUNTER — Encounter: Payer: Self-pay | Admitting: Podiatry

## 2016-02-04 ENCOUNTER — Ambulatory Visit: Payer: Self-pay | Admitting: Physician Assistant

## 2016-02-04 ENCOUNTER — Ambulatory Visit (INDEPENDENT_AMBULATORY_CARE_PROVIDER_SITE_OTHER): Payer: 59 | Admitting: Podiatry

## 2016-02-04 ENCOUNTER — Encounter: Payer: Self-pay | Admitting: Physician Assistant

## 2016-02-04 VITALS — BP 110/80 | HR 76 | Temp 98.6°F

## 2016-02-04 DIAGNOSIS — M7752 Other enthesopathy of left foot: Secondary | ICD-10-CM

## 2016-02-04 DIAGNOSIS — H60392 Other infective otitis externa, left ear: Secondary | ICD-10-CM

## 2016-02-04 DIAGNOSIS — M779 Enthesopathy, unspecified: Principal | ICD-10-CM

## 2016-02-04 DIAGNOSIS — M778 Other enthesopathies, not elsewhere classified: Secondary | ICD-10-CM

## 2016-02-04 MED ORDER — FLUTICASONE PROPIONATE 50 MCG/ACT NA SUSP: 2.0000 | Freq: Every day | NASAL | 6 refills | Status: DC

## 2016-02-04 MED ORDER — NEOMYCIN-POLYMYXIN-HC 3.5-10000-1 OT SOLN: 4.0000 [drp] | Freq: Four times a day (QID) | OTIC | 0 refills | Status: DC

## 2016-02-04 NOTE — Progress Notes (Signed)
She presents today for follow-up of her plantar fasciitis. She states that is doing much better however I'm starting to develop pain across the top of the foot now.  Objective: Vital signs are stable alert and oriented 3. No pain on palpation medial calcaneal tubercle of the left heel pain. She does have pain on palpation dorsal aspect of Lisfranc's joint of the second metatarsal intermediate cuneiform. Radiographs reviewed do demonstrate early osteoarthritis dorsal aspect left foot.  Assessment: Well-healing plantar fasciitis. Capsulitis osteoarthritis dorsal aspect left foot.  Plan: Injected the dorsal aspect of left foot with Kenalog and local anesthetic. Follow-up with her in 6 weeks.

## 2016-02-04 NOTE — Progress Notes (Signed)
S: c/o left ear pain, hurts to put q tip in , no drainage, no dif hearing, recently treated for uri, still has cough but congestion cleared , no fever/chills, no cp/sob  O: vitals wnl, nad, left ear canal with swelling and redness, area is tender when otoscope placed in canal, tms dull,  throat wnl, neck supple no lymph, lungs c t a, cv rrr  A: otitis externa  P: cortisporin otic drops, flonase, otc sudafed

## 2016-02-05 ENCOUNTER — Ambulatory Visit: Payer: 59 | Admitting: Physician Assistant

## 2016-02-10 ENCOUNTER — Encounter: Payer: Self-pay | Admitting: Physician Assistant

## 2016-02-10 ENCOUNTER — Ambulatory Visit: Payer: Self-pay | Admitting: Physician Assistant

## 2016-02-10 VITALS — BP 128/82 | HR 84 | Temp 98.2°F

## 2016-02-10 DIAGNOSIS — H6122 Impacted cerumen, left ear: Secondary | ICD-10-CM

## 2016-02-10 MED ORDER — PREDNISONE 10 MG PO TABS
30.0000 mg | ORAL_TABLET | Freq: Every day | ORAL | 0 refills | Status: DC
Start: 2016-02-10 — End: 2016-05-06

## 2016-02-10 NOTE — Progress Notes (Signed)
S: states ear still has pressure, using ear drops for AOE, no drainage, just feels like something is lying on her ear drum  O: vitals wnl, nad, left ear canal with small amount of wax lying on tm, redness decreased from last visit, neck supple no lymph, irrigated with warm water wax removed; pt states the pressure is better  A: cerumen on TM, dysfunction of eustachean tube  P: pred 30mg  qd x 3d, if pressure worsens will refer to ENT

## 2016-02-18 ENCOUNTER — Other Ambulatory Visit: Payer: Self-pay | Admitting: Physician Assistant

## 2016-02-18 DIAGNOSIS — E119 Type 2 diabetes mellitus without complications: Secondary | ICD-10-CM

## 2016-03-10 ENCOUNTER — Ambulatory Visit
Admission: RE | Admit: 2016-03-10 | Discharge: 2016-03-10 | Disposition: A | Discharge status: Home / Self Care | Payer: 59 | Source: Ambulatory Visit | Attending: Physician Assistant | Admitting: Physician Assistant

## 2016-03-10 DIAGNOSIS — Z1239 Encounter for other screening for malignant neoplasm of breast: Secondary | ICD-10-CM

## 2016-03-10 DIAGNOSIS — Z1231 Encounter for screening mammogram for malignant neoplasm of breast: Secondary | ICD-10-CM | POA: Insufficient documentation

## 2016-03-17 ENCOUNTER — Ambulatory Visit: Payer: 59 | Admitting: Podiatry

## 2016-04-28 ENCOUNTER — Ambulatory Visit: Payer: Self-pay | Admitting: Pharmacist

## 2016-05-03 ENCOUNTER — Telehealth: Payer: Self-pay | Admitting: Internal Medicine

## 2016-05-03 DIAGNOSIS — D649 Anemia, unspecified: Secondary | ICD-10-CM

## 2016-05-03 DIAGNOSIS — E559 Vitamin D deficiency, unspecified: Secondary | ICD-10-CM

## 2016-05-03 DIAGNOSIS — E119 Type 2 diabetes mellitus without complications: Secondary | ICD-10-CM

## 2016-05-03 DIAGNOSIS — E7800 Pure hypercholesterolemia, unspecified: Secondary | ICD-10-CM

## 2016-05-03 NOTE — Telephone Encounter (Signed)
I have accepted Miranda Huang as a new patient .  (her mother is my patient , Everlene Farrier Gibbs.0.  Please set her up to have fasting labs (ordered) and an OV in the next month.  You can use a 4:30 appt.

## 2016-05-04 NOTE — Telephone Encounter (Signed)
Patient has been scheduled and labs day before patient scheduled for Thursday at 8.30 05/06/16 labs on 05/05/16. FYI

## 2016-05-05 ENCOUNTER — Other Ambulatory Visit (INDEPENDENT_AMBULATORY_CARE_PROVIDER_SITE_OTHER): Payer: 59

## 2016-05-05 DIAGNOSIS — E119 Type 2 diabetes mellitus without complications: Secondary | ICD-10-CM

## 2016-05-05 DIAGNOSIS — D509 Iron deficiency anemia, unspecified: Secondary | ICD-10-CM

## 2016-05-05 DIAGNOSIS — E7800 Pure hypercholesterolemia, unspecified: Secondary | ICD-10-CM | POA: Diagnosis not present

## 2016-05-05 DIAGNOSIS — E559 Vitamin D deficiency, unspecified: Secondary | ICD-10-CM

## 2016-05-05 DIAGNOSIS — D649 Anemia, unspecified: Secondary | ICD-10-CM | POA: Diagnosis not present

## 2016-05-05 LAB — LIPID PANEL
CHOL/HDL RATIO: 5
Cholesterol: 192 mg/dL (ref 0–200)
HDL: 42.4 mg/dL (ref >39.00)
LDL Cholesterol: 111 mg/dL — ABNORMAL HIGH (ref 0–99)
NONHDL: 149.45
Triglycerides: 193 mg/dL — ABNORMAL HIGH (ref 0.0–149.0)
VLDL: 38.6 mg/dL (ref 0.0–40.0)

## 2016-05-05 LAB — CBC WITH DIFFERENTIAL/PLATELET
Basophils Absolute: 0 10*3/uL (ref 0.0–0.1)
Basophils Relative: 0.9 % (ref 0.0–3.0)
Eosinophils Absolute: 0.2 10*3/uL (ref 0.0–0.7)
Eosinophils Relative: 2.7 % (ref 0.0–5.0)
HEMATOCRIT: 35.2 % — AB (ref 36.0–46.0)
HEMOGLOBIN: 11.5 g/dL — AB (ref 12.0–15.0)
LYMPHS PCT: 23.1 % (ref 12.0–46.0)
Lymphs Abs: 1.3 10*3/uL (ref 0.7–4.0)
MCHC: 32.7 g/dL (ref 30.0–36.0)
MCV: 74.7 fl — AB (ref 78.0–100.0)
MONOS PCT: 9.5 % (ref 3.0–12.0)
Monocytes Absolute: 0.5 10*3/uL (ref 0.1–1.0)
Neutro Abs: 3.6 10*3/uL (ref 1.4–7.7)
Neutrophils Relative %: 63.8 % (ref 43.0–77.0)
Platelets: 265 10*3/uL (ref 150.0–400.0)
RBC: 4.71 Mil/uL (ref 3.87–5.11)
RDW: 18.1 % — ABNORMAL HIGH (ref 11.5–15.5)
WBC: 5.7 10*3/uL (ref 4.0–10.5)

## 2016-05-05 LAB — COMPREHENSIVE METABOLIC PANEL
ALBUMIN: 4 g/dL (ref 3.5–5.2)
ALT: 35 U/L (ref 0–35)
AST: 22 U/L (ref 0–37)
Alkaline Phosphatase: 83 U/L (ref 39–117)
BUN: 7 mg/dL (ref 6–23)
CALCIUM: 9.1 mg/dL (ref 8.4–10.5)
CHLORIDE: 101 meq/L (ref 96–112)
CO2: 30 mEq/L (ref 19–32)
Creatinine, Ser: 0.47 mg/dL (ref 0.40–1.20)
GFR: 145.86 mL/min (ref >60.00)
Glucose, Bld: 105 mg/dL — ABNORMAL HIGH (ref 70–99)
POTASSIUM: 4.2 meq/L (ref 3.5–5.1)
SODIUM: 138 meq/L (ref 135–145)
Total Bilirubin: 0.5 mg/dL (ref 0.2–1.2)
Total Protein: 6.5 g/dL (ref 6.0–8.3)

## 2016-05-05 LAB — TSH: TSH: 1.81 u[IU]/mL (ref 0.35–4.50)

## 2016-05-05 LAB — MICROALBUMIN / CREATININE URINE RATIO
Creatinine,U: 13.2 mg/dL
MICROALB/CREAT RATIO: 5.3 mg/g (ref 0.0–30.0)
Microalb, Ur: 0.7 mg/dL (ref 0.0–1.9)

## 2016-05-05 LAB — FERRITIN: FERRITIN: 6 ng/mL — AB (ref 10.0–291.0)

## 2016-05-05 LAB — IBC PANEL
IRON: 39 ug/dL — AB (ref 42–145)
SATURATION RATIOS: 7.8 % — AB (ref 20.0–50.0)
TRANSFERRIN: 357 mg/dL (ref 212.0–360.0)

## 2016-05-05 LAB — HEMOGLOBIN A1C: Hgb A1c MFr Bld: 7.3 % — ABNORMAL HIGH (ref 4.6–6.5)

## 2016-05-05 LAB — VITAMIN D 25 HYDROXY (VIT D DEFICIENCY, FRACTURES): VITD: 33.13 ng/mL (ref 30.00–100.00)

## 2016-05-06 ENCOUNTER — Encounter: Payer: Self-pay | Admitting: Internal Medicine

## 2016-05-06 ENCOUNTER — Ambulatory Visit (INDEPENDENT_AMBULATORY_CARE_PROVIDER_SITE_OTHER): Payer: 59 | Admitting: Internal Medicine

## 2016-05-06 VITALS — BP 128/80 | HR 88 | Resp 16 | Ht 64.25 in | Wt 212.2 lb

## 2016-05-06 DIAGNOSIS — E119 Type 2 diabetes mellitus without complications: Secondary | ICD-10-CM | POA: Diagnosis not present

## 2016-05-06 DIAGNOSIS — D509 Iron deficiency anemia, unspecified: Secondary | ICD-10-CM

## 2016-05-06 DIAGNOSIS — Z23 Encounter for immunization: Secondary | ICD-10-CM

## 2016-05-06 DIAGNOSIS — F324 Major depressive disorder, single episode, in partial remission: Secondary | ICD-10-CM

## 2016-05-06 DIAGNOSIS — R1011 Right upper quadrant pain: Secondary | ICD-10-CM | POA: Diagnosis not present

## 2016-05-06 DIAGNOSIS — F5105 Insomnia due to other mental disorder: Secondary | ICD-10-CM | POA: Diagnosis not present

## 2016-05-06 DIAGNOSIS — F419 Anxiety disorder, unspecified: Secondary | ICD-10-CM

## 2016-05-06 DIAGNOSIS — E669 Obesity, unspecified: Secondary | ICD-10-CM

## 2016-05-06 MED ORDER — GLIPIZIDE 5 MG PO TABS: 2.5000 mg | ORAL_TABLET | Freq: Two times a day (BID) | ORAL | 3 refills | Status: DC

## 2016-05-06 NOTE — Progress Notes (Signed)
Pre visit review using our clinic review tool, if applicable. No additional management support is needed unless otherwise documented below in the visit note. 

## 2016-05-06 NOTE — Progress Notes (Signed)
Subjective:  Patient ID: Miranda Huang, female    DOB: [birth year]  Age: 56 y.o. MRN: [id number]  CC: The primary encounter diagnosis was Colicky RUQ abdominal pain. Diagnoses of Need for pneumococcal vaccination, Type 2 diabetes mellitus without complication, without long-term current use of insulin ([place]), Iron deficiency anemia, unspecified iron deficiency anemia type, Major depressive disorder with single episode, in partial remission ([place]), Insomnia secondary to anxiety, and Obesity (BMI 30.0-34.9) were also pertinent to this visit.  HPI Miranda Huang presents for transition of care.  She has a history of type 2 DM, hyperlipidemia and obesity.   Type 2 dm:  Morning blood sugars (fasting) have been elevated to 150 to 160.  metformin dose is currently 500 mg bid.  Higher dose caused side effects.   No trial of other medications.  a1c done today and discussed. Starting glipizide 2.5 mg bid   Does not exercise.  Right ankle swells daily to 3 x current size by end of day.    History of remote ankle sprain years ago, treated with an aircast boot . Also had surgery on foot for PF 2 years ago [facility]) and great toe surgery .  Right ankle is now becoming painful again.   Has VI by prior vascular evaluation . Wears compression stockings but finds them uncomfortable because they cut into the top of her lower leg .  Feels she does a lot of walking during the workday .  Wears tennis shoes with orthotics prescribed by [place].   2)  recurrent right upper quadrant pain occurs postprandially  But sometimes has no relationship to eating ,  Can last up to 30 minutes if post prandially  Can be severe,  Doubled over at ties.   No stool changes,   Has some IBS symptoms which are chronic and often leans toward diarrhea. [place] the loose stools are not temporally related to this pain.  S/p chole in 2008  For RUQ pain without stones on ultrasound,  Found to have a severely diseased GB noted       Outpatient  Medications Prior to Visit  Medication Sig Dispense Refill  . albuterol (PROVENTIL HFA;VENTOLIN HFA) 108 (90 Base) MCG/ACT inhaler Inhale 2 puffs into the lungs every 6 (six) hours as needed for wheezing or shortness of breath. 1 Inhaler 0  . ALPRAZolam (XANAX) 0.25 MG tablet TAKE ONE TABLET BY MOUTH AT BEDTIME AS NEEDED FOR ANXIETY 30 tablet 3  . Biotin 5000 MCG TABS Take 1 tablet by mouth daily.    . celecoxib (CELEBREX) 200 MG capsule TAKE 1 CAPSULE BY MOUTH DAILY 15 capsule 0  . escitalopram (LEXAPRO) 10 MG tablet TAKE 1 TO 2 TABLETS BY MOUTH DAILY 180 tablet 1  . esomeprazole (NEXIUM) 40 MG capsule Take 1 capsule (40 mg total) by mouth daily at 12 noon. 90 capsule 1  . fluticasone (FLONASE) 50 MCG/ACT nasal spray Place 2 sprays into both nostrils daily. 16 g 6  . furosemide (LASIX) 20 MG tablet Take 1 tablet by mouth daily as needed. Reported on 08/06/2015    . gabapentin (NEURONTIN) 100 MG capsule TAKE 1 CAPSULE BY MOUTH AT BEDTIME (Patient taking differently: TAKE 1-2 CAPSULES BY MOUTH AT BEDTIME) 15 capsule 0  . glucose blood test strip BAYER CONTOUR TEST (In Vitro Strip)  1 (one) Strip two times daily, and as needed for 0 days  Quantity: 100;  Refills: 12   Ordered :11-Jun-2014  Althea Charon ;  Started 11-Jun-2014 Active Comments:  Medication taken as needed.    . hydrochlorothiazide (HYDRODIURIL) 25 MG tablet TAKE 1 TABLET BY MOUTH ONCE DAILY 90 tablet 4  . lisinopril (PRINIVIL,ZESTRIL) 5 MG tablet TAKE 1 TABLET BY MOUTH ONCE DAILY 90 tablet 3  . metFORMIN (GLUCOPHAGE-XR) 500 MG 24 hr tablet TAKE 2 TABLETS BY MOUTH WITH BREAKFAST 180 tablet 1  . Probiotic CAPS Take 1 capsule by mouth daily.    . Vitamin D, Ergocalciferol, (DRISDOL) 50000 units CAPS capsule Take 1 capsule (50,000 Units total) by mouth every 7 (seven) days. 4 capsule 6  . celecoxib (CELEBREX) 200 MG capsule Take 1 capsule (200 mg total) by mouth 2 (two) times daily. (Patient not taking: Reported on 02/10/2016) 30 capsule 5    . chlorpheniramine-HYDROcodone (TUSSIONEX PENNKINETIC ER) 10-8 MG/5ML SUER Take 5 mLs by mouth every 12 (twelve) hours as needed for cough. (Patient not taking: Reported on 02/10/2016) 150 mL 0  . gabapentin (NEURONTIN) 300 MG capsule Take 1 capsule (300 mg total) by mouth 3 (three) times daily. (Patient not taking: Reported on 05/06/2016) 90 capsule 3  . Misc Natural Products (BLACK COHOSH MENOPAUSE COMPLEX PO) Take 1 tablet by mouth daily. Menosense formulation    . neomycin-polymyxin-hydrocortisone (CORTISPORIN) otic solution Place 4 drops into the left ear 4 (four) times daily. (Patient not taking: Reported on 05/06/2016) 10 mL 0  . predniSONE (DELTASONE) 10 MG tablet Take 3 tablets (30 mg total) by mouth daily with breakfast. (Patient not taking: Reported on 05/06/2016) 9 tablet 0  . traMADol (ULTRAM) 50 MG tablet TAKE ONE TABLET BY MOUTH EVERY 8 HOURS AS NEEDED (Patient not taking: Reported on 02/10/2016) 30 tablet 0   No facility-administered medications prior to visit.     Review of Systems;  Patient denies headache, fevers, malaise, unintentional weight loss, skin rash, eye pain, sinus congestion and sinus pain, sore throat, dysphagia,  hemoptysis , cough, dyspnea, wheezing, chest pain, palpitations, orthopnea,  melena,, constipation, flank pain, dysuria, hematuria, urinary  Frequency, nocturia, numbness, tingling, seizures,  Focal weakness, Loss of consciousness,  Tremor, insomnia, depression, anxiety, and suicidal ideation.      Objective:  BP 128/80 (BP Location: Left Arm, Patient Position: Sitting, Cuff Size: Large)   Pulse 88   Resp 16   Ht 5' 4.25" (1.632 m)   Wt 212 lb 3.2 oz (96.3 kg)   SpO2 96%   BMI 36.14 kg/m   BP Readings from Last 3 Encounters:  05/06/16 128/80  02/10/16 128/82  02/04/16 110/80    Wt Readings from Last 3 Encounters:  05/06/16 212 lb 3.2 oz (96.3 kg)  11/17/15 210 lb (95.3 kg)  10/22/15 215 lb (97.5 kg)    General appearance: alert, cooperative  and appears stated age Ears: normal TM's and external ear canals both ears Throat: lips, mucosa, and tongue normal; teeth and gums normal Neck: no adenopathy, no carotid bruit, supple, symmetrical, trachea midline and thyroid not enlarged, symmetric, no tenderness/mass/nodules Back: symmetric, no curvature. ROM normal. No CVA tenderness. Lungs: clear to auscultation bilaterally Heart: regular rate and rhythm, S1, S2 normal, no murmur, click, rub or gallop Abdomen: soft, non-tender; bowel sounds normal; no masses,  no organomegaly Pulses: 2+ and symmetric Skin: Skin color, texture, turgor normal. No rashes or lesions Lymph nodes: Cervical, supraclavicular, and axillary nodes normal.  Lab Results  Component Value Date   HGBA1C 7.3 (H) 05/05/2016   HGBA1C 6.6 (H) 08/08/2015   HGBA1C 6.8 03/25/2015    Lab Results  Component Value Date  CREATININE 0.47 05/05/2016   CREATININE 0.51 08/08/2015   CREATININE 0.58 06/11/2014    Lab Results  Component Value Date   WBC 5.7 05/05/2016   HGB 11.5 (L) 05/05/2016   HCT 35.2 (L) 05/05/2016   PLT 265.0 05/05/2016   GLUCOSE 105 (H) 05/05/2016   CHOL 192 05/05/2016   TRIG 193.0 (H) 05/05/2016   HDL 42.40 05/05/2016   LDLCALC 111 (H) 05/05/2016   ALT 35 05/05/2016   AST 22 05/05/2016   NA 138 05/05/2016   K 4.2 05/05/2016   CL 101 05/05/2016   CREATININE 0.47 05/05/2016   BUN 7 05/05/2016   CO2 30 05/05/2016   TSH 1.81 05/05/2016   HGBA1C 7.3 (H) 05/05/2016   MICROALBUR 0.7 05/05/2016    Mm Screening Breast Tomo Bilateral  Result Date: 03/10/2016 CLINICAL DATA:  Screening. EXAM: 2D DIGITAL SCREENING BILATERAL MAMMOGRAM WITH CAD AND ADJUNCT TOMO COMPARISON:  Previous exam(s). ACR Breast Density Category b: There are scattered areas of fibroglandular density. FINDINGS: There are no findings suspicious for malignancy. Images were processed with CAD. IMPRESSION: No mammographic evidence of malignancy. A result letter of this screening  mammogram will be mailed directly to the patient. RECOMMENDATION: Screening mammogram in one year. (Code:SM-B-01Y) BI-RADS CATEGORY  1: Negative. Electronically Signed   By: Ammie Ferrier M.D.   On: 03/10/2016 13:39    Assessment & Plan:   Problem List Items Addressed This Visit    Clinical depression    In remission,  continue lexapro 20 mg daily       Diabetes mellitus, type 2 ([place])    Now in need of medication adjustment for a1`c .7.  Adding glipizide.   Lab Results  Component Value Date   HGBA1C 7.3 (H) 05/05/2016   Lab Results  Component Value Date   MICROALBUR 0.7 05/05/2016         Relevant Medications   glipiZIDE (GLUCOTROL) 5 MG tablet   Insomnia secondary to anxiety    Her anxiety is triggered by the serious health conditions of her mother The risks and benefits of benzodiazepine use were discussed with patient today including excessive sedation leading to respiratory depression,  impaired thinking/driving, and addiction.  Patient was advised to avoid concurrent use with alcohol, to use medication only as needed and not to share with others  .       Iron deficiency anemia    Suspect gastritis /ulcer as cause given current cc of post prandial abd pain  abd ultrasound and GI referral in progress      Relevant Orders   Ambulatory referral to Gastroenterology   Obesity (BMI 30.0-34.9)    I have addressed  BMI and recommended a low glycemic index diet utilizing smaller more frequent meals to increase metabolism.  I have also recommended that patient start exercising with a goal of 30 minutes of aerobic exercise a minimum of 5 days per week. Screening for lipid disorders, thyroid and diabetes to be done today.  Lab Results  Component Value Date   TSH 1.81 05/05/2016   Lab Results  Component Value Date   CHOL 192 05/05/2016   HDL 42.40 05/05/2016   LDLCALC 111 (H) 05/05/2016   TRIG 193.0 (H) 05/05/2016   CHOLHDL 5 05/05/2016           Relevant  Medications   glipiZIDE (GLUCOTROL) 5 MG tablet    Other Visit Diagnoses    Colicky RUQ abdominal pain    -  Primary   Relevant Orders  US Abdomen Complete   Ambulatory referral to Gastroenterology   Need for pneumococcal vaccination       Relevant Orders   Pneumococcal polysaccharide vaccine 23-valent greater than or equal to 2yo subcutaneous/IM (Completed)      I have discontinued Ms. Pittsley's traMADol, Misc Natural Products (BLACK COHOSH MENOPAUSE COMPLEX PO), chlorpheniramine-HYDROcodone, neomycin-polymyxin-hydrocortisone, and predniSONE. I am also having her start on glipiZIDE. Additionally, I am having her maintain her glucose blood, Probiotic, furosemide, Vitamin D (Ergocalciferol), lisinopril, Biotin, esomeprazole, ALPRAZolam, gabapentin, celecoxib, hydrochlorothiazide, albuterol, escitalopram, fluticasone, and metFORMIN.  Meds ordered this encounter  Medications  . glipiZIDE (GLUCOTROL) 5 MG tablet    Sig: Take 0.5 tablets (2.5 mg total) by mouth 2 (two) times daily before a meal.    Dispense:  60 tablet    Refill:  3  A total of 45 minutes was spent with patient more than half of which was spent in counseling patient on the above mentioned issues , reviewing and explaining recent labs and imaging studies done, and coordination of care.  Medications Discontinued During This Encounter  Medication Reason  . celecoxib (CELEBREX) 078 MG capsule Duplicate  . chlorpheniramine-HYDROcodone (TUSSIONEX PENNKINETIC ER) 10-8 MG/5ML SUER Patient has not taken in last 30 days  . gabapentin (NEURONTIN) 300 MG capsule Patient has not taken in last 30 days  . Misc Natural Products (BLACK COHOSH MENOPAUSE COMPLEX PO) Patient has not taken in last 30 days  . neomycin-polymyxin-hydrocortisone (CORTISPORIN) otic solution Therapy completed  . predniSONE (DELTASONE) 10 MG tablet Therapy completed  . traMADol (ULTRAM) 50 MG tablet Patient has not taken in last 30 days    Follow-up: Return for  follow up diabetes.   Crecencio Mc, MD

## 2016-05-06 NOTE — Patient Instructions (Addendum)
Check out [employer] for compression garments   Red Yeast Rice 600 mg twice daily for cholesterol  Pneumovax given today  need tetanus booster (not TdaP)  This year     This is  Dr. Lupita Dawn  example of a  "Low GI"  Diet:  It will allow you to lose 4 to 8  lbs  per month if you follow it carefully.  Your goal with exercise is a minimum of 30 minutes of aerobic exercise 5 days per week (Walking does not count once it becomes easy!)    All of the foods can be found at grocery stores and in bulk at [employer].  The Atkins protein bars and shakes are available in more varieties at Target, [employer] and [place].     7 AM Breakfast:  Choose from the following:  Low carbohydrate Protein  Shakes (I recommend the  Premier Protein chocolate shakes,  EAS AdvantEdge "Carb Control" shakes  Or the Atkins shakes all are under 3 net carbs)     a scrambled egg/bacon/cheese burrito made with Mission's "carb balance" whole wheat tortilla  (about 10 net carbs )  Regulatory affairs officer (basically a quiche without the pastry crust) that is eaten cold and very convenient way to get your eggs.  8 carbs)  If you make your own protein shakes, avoid bananas and pineapple,  And use low carb greek yogurt or original /unsweetened almond or soy milk    Avoid cereal and bananas, oatmeal and cream of wheat and grits. They are loaded with carbohydrates!   10 AM: high protein snack:  Protein bar by Atkins (the snack size, under 200 cal, usually < 6 net carbs).    A stick of cheese:  Around 1 carb,  100 cal     Dannon Light n Fit [place] Yogurt  (80 cal, 8 carbs)  Other so called "protein bars" and Greek yogurts tend to be loaded with carbohydrates.  Remember, in food advertising, the word "energy" is synonymous for " carbohydrate."  Lunch:   A Sandwich using the bread choices listed, Can use any  Eggs,  lunchmeat, grilled meat or canned tuna), avocado, regular mayo/mustard  and cheese.  A Salad  using blue cheese, ranch,  Goddess or vinagrette,  Avoid taco shells, croutons or "confetti" and no "candied nuts" but regular nuts OK.   No pretzels, nabs  or chips.  Pickles and miniature sweet peppers are a good low carb alternative that provide a "crunch"  The bread is the only source of carbohydrate in a sandwich and  can be decreased by trying some of the attached alternatives to traditional loaf bread   Avoid "Low fat dressings, as well as Garber dressings They are loaded with sugar!   3 PM/ Mid day  Snack:  Consider  1 ounce of  almonds, walnuts, pistachios, pecans, peanuts,  Macadamia nuts or a nut medley.  Avoid "granola and granola bars "  Mixed nuts are ok in moderation as long as there are no raisins,  cranberries or dried fruit.   KIND bars are OK if you get the low glycemic index variety   Try the prosciutto/mozzarella cheese sticks by Fiorruci  In deli /backery section   High protein      6 PM  Dinner:     Meat/fowl/fish with a green salad, and either broccoli, cauliflower, green beans, spinach, brussel sprouts or  Lima beans. DO NOT BREAD THE PROTEIN!!  There is a low carb pasta by Dreamfield's that is acceptable and tastes great: only 5 digestible carbs/serving.( All grocery stores but BJs carry it ) Several ready made meals are available low carb:   Try Michel Angelo's chicken piccata or chicken or eggplant parm over low carb pasta.(Lowes and BJs)   Marjory Lies Sanchez's "Carnitas" (pulled pork, no sauce,  0 carbs) or his beef pot roast to make a dinner burrito (at [employer]J's)  Pesto over low carb pasta (bj's sells a good quality pesto in the center refrigerated section of the deli   Try satueeing  Cheral Marker with mushroooms as a good side   [employer] Giant makes a mashed cauliflower that tastes like mashed potatoes  Whole wheat pasta is still full of digestible carbs and  Not as low in glycemic index as Dreamfield's.   Brown rice is still rice,  So skip the rice  and noodles if you eat [place] or [place] (or at least limit to 1/2 cup)  9 PM snack :   Breyer's "low carb" fudgsicle or  ice cream bar (Carb Smart line), or  Weight Watcher's ice cream bar , or another "no sugar added" ice cream;  a serving of fresh berries/cherries with whipped cream   Cheese or DANNON'S LlGHT N FIT GREEK YOGURT  8 ounces of [place] unsweetened almond/cococunut milk    Treat yourself to a parfait made with whipped cream blueberiies, walnuts and vanilla greek yogurt  Avoid bananas, pineapple, grapes  and watermelon on a regular basis because they are high in sugar.  THINK OF THEM AS DESSERT  Remember that snack Substitutions should be less than 10 NET carbs per serving and meals < 20 carbs. Remember to subtract fiber grams to get the "net carbs."

## 2016-05-08 ENCOUNTER — Encounter: Payer: Self-pay | Admitting: Internal Medicine

## 2016-05-08 NOTE — Assessment & Plan Note (Signed)
Now in need of medication adjustment for a1`c .7.  Adding glipizide.   Lab Results  Component Value Date   HGBA1C 7.3 (H) 05/05/2016   Lab Results  Component Value Date   MICROALBUR 0.7 05/05/2016

## 2016-05-08 NOTE — Assessment & Plan Note (Signed)
I have addressed  BMI and recommended a low glycemic index diet utilizing smaller more frequent meals to increase metabolism.  I have also recommended that patient start exercising with a goal of 30 minutes of aerobic exercise a minimum of 5 days per week. Screening for lipid disorders, thyroid and diabetes to be done today.  Lab Results  Component Value Date   TSH 1.81 05/05/2016   Lab Results  Component Value Date   CHOL 192 05/05/2016   HDL 42.40 05/05/2016   LDLCALC 111 (H) 05/05/2016   TRIG 193.0 (H) 05/05/2016   CHOLHDL 5 05/05/2016

## 2016-05-08 NOTE — Assessment & Plan Note (Signed)
Suspect gastritis /ulcer as cause given current cc of post prandial abd pain  abd ultrasound and GI referral in progress

## 2016-05-08 NOTE — Assessment & Plan Note (Signed)
Her anxiety is triggered by the serious health conditions of her mother The risks and benefits of benzodiazepine use were discussed with patient today including excessive sedation leading to respiratory depression,  impaired thinking/driving, and addiction.  Patient was advised to avoid concurrent use with alcohol, to use medication only as needed and not to share with others  .

## 2016-05-08 NOTE — Assessment & Plan Note (Signed)
In remission,  continue lexapro 20 mg daily

## 2016-05-19 ENCOUNTER — Encounter: Payer: Self-pay | Admitting: Internal Medicine

## 2016-05-19 ENCOUNTER — Ambulatory Visit
Admission: RE | Admit: 2016-05-19 | Discharge: 2016-05-19 | Disposition: A | Discharge status: Home / Self Care | Payer: 59 | Source: Ambulatory Visit | Attending: Internal Medicine | Admitting: Internal Medicine

## 2016-05-19 ENCOUNTER — Other Ambulatory Visit: Payer: Self-pay | Admitting: Internal Medicine

## 2016-05-19 DIAGNOSIS — R109 Unspecified abdominal pain: Secondary | ICD-10-CM | POA: Diagnosis not present

## 2016-05-19 DIAGNOSIS — Z9049 Acquired absence of other specified parts of digestive tract: Secondary | ICD-10-CM | POA: Diagnosis not present

## 2016-05-19 DIAGNOSIS — R1011 Right upper quadrant pain: Secondary | ICD-10-CM | POA: Diagnosis not present

## 2016-05-19 DIAGNOSIS — K760 Fatty (change of) liver, not elsewhere classified: Secondary | ICD-10-CM | POA: Insufficient documentation

## 2016-05-19 MED ORDER — SUCRALFATE 1 G PO TABS: 1.0000 g | ORAL_TABLET | Freq: Three times a day (TID) | ORAL | 1 refills | Status: DC

## 2016-05-19 MED ORDER — SUCRALFATE 1 GM/10ML PO SUSP
1.0000 g | Freq: Three times a day (TID) | ORAL | 0 refills | Status: DC
Start: 2016-05-19 — End: 2016-05-19

## 2016-05-19 NOTE — Telephone Encounter (Signed)
Can you check on the status of this Gi referral  I  placed on march 10th? Thanks . Wants to see [place] gi

## 2016-05-20 ENCOUNTER — Encounter: Payer: Self-pay | Admitting: Internal Medicine

## 2016-05-20 NOTE — Telephone Encounter (Signed)
Ok to change from liquid to pill?

## 2016-05-27 ENCOUNTER — Other Ambulatory Visit: Payer: Self-pay

## 2016-05-27 DIAGNOSIS — E559 Vitamin D deficiency, unspecified: Secondary | ICD-10-CM

## 2016-05-27 MED ORDER — VITAMIN D (ERGOCALCIFEROL) 1.25 MG (50000 UNIT) PO CAPS: 50000.0000 [IU] | ORAL_CAPSULE | ORAL | 1 refills | Status: DC

## 2016-06-22 ENCOUNTER — Encounter: Payer: Self-pay | Admitting: Internal Medicine

## 2016-06-22 ENCOUNTER — Ambulatory Visit (INDEPENDENT_AMBULATORY_CARE_PROVIDER_SITE_OTHER): Payer: 59 | Admitting: Internal Medicine

## 2016-06-22 VITALS — BP 110/70 | HR 72 | Ht 65.0 in | Wt 206.2 lb

## 2016-06-22 DIAGNOSIS — R1013 Epigastric pain: Secondary | ICD-10-CM | POA: Diagnosis not present

## 2016-06-22 DIAGNOSIS — D509 Iron deficiency anemia, unspecified: Secondary | ICD-10-CM | POA: Diagnosis not present

## 2016-06-22 DIAGNOSIS — K219 Gastro-esophageal reflux disease without esophagitis: Secondary | ICD-10-CM | POA: Diagnosis not present

## 2016-06-22 NOTE — Progress Notes (Signed)
HISTORY OF PRESENT ILLNESS:  Miranda Huang is a 56 y.o. 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 Derrel Nip with chief complaints of abdominal pain, GERD, and iron deficiency anemia. Recent blood work revealed anemia with a hemoglobin of 11.5 and MCV 74.7. Ferritin level was 6. She denies [employer]. Occasional minor rectal bleeding which she attributes to hemorrhoids. Patient reports that she underwent routine screening colonoscopy at age 85. No report available but she tells me that she had diverticulosis without polyps. Follow-up in 10 years recommended. She was having menstrual periods until last year. She was not quizzed regarding blood donation. Next, she reports several year history of postprandial epigastric pain which occurs proximal lead 15 minutes after meals and may last 30 minutes. Mild nausea but no vomiting. She states that this is similar to "gallbladder attacks". She apparently underwent cholecystectomy without cholelithiasis. She tells me that her surgeon was uncertain whether cholecystectomy would be helpful. She feels that the discomfort is more frequent and more intense but last the same duration. Several episodes per week. No nocturnal episodes. No additional exacerbating or relieving factors. Finally, she does have chronic GERD. No prior endoscopy. Previously on omeprazole. Now Nexium. Some breakthrough symptoms. No bloating. GI review of systems otherwise negative. Review of outside records including radiology shows abdominal ultrasound 05/19/2016 revealed fatty liver. Poor visualization of the pancreas. Laboratories from March 2018 revealing normal liver tests.  REVIEW OF SYSTEMS:  All non-GI ROS unless otherwise stated in the history of present illness negative except for anxiety and depression  Past Medical History:  Diagnosis Date  . Anxiety   .  Depression   . Diabetes mellitus without complication ([place])   . Pre-diabetes   . Swelling     Past Surgical History:  Procedure Laterality Date  . [place]  . CHOLECYSTECTOMY  2009  . DILATION AND CURETTAGE OF UTERUS  2009  . TONSILLECTOMY AND ADENOIDECTOMY      Social History Miranda Huang  reports that she has never smoked. She has never used smokeless tobacco. She reports that she drinks alcohol. She reports that she does not use drugs.  family history includes Alcohol abuse in her father; Anxiety disorder in her father; Breast cancer (age of onset: 22) in her maternal aunt; Diabetes in her paternal grandmother; Heart disease in her father; Pancreatitis in her father; Sudden death in her father.  Allergies  Allergen Reactions  . Codeine Itching    Pt pretreats with Benadryl  . Erythromycin Nausea And Vomiting    GI upset  . Tussin Cough  [Dextromethorphan Hbr] Other (See Comments)    "Wacky Dreams"       PHYSICAL EXAMINATION: Vital signs: BP 110/70   Pulse 72   Ht 5\' 5"  (1.651 m)   Wt 206 lb 3.2 oz (93.5 kg)   BMI 34.31 kg/m   Constitutional: Pleasant, obese but generally well-appearing, no acute distress Psychiatric: alert and oriented x3, cooperative Eyes: extraocular movements intact, anicteric, conjunctiva pink Mouth: oral pharynx moist, no lesions Neck: supple no lymphadenopathy Cardiovascular: heart regular rate and rhythm, no murmur Lungs: clear to auscultation bilaterally Abdomen: soft,Obese, nontender, nondistended, no obvious ascites, no peritoneal signs, normal bowel sounds, no organomegaly Rectal:Deferred until colonoscopy Extremities: no clubbing cyanosis or lower extremity edema bilaterally Skin: no lesions on visible extremities Neuro: No focal deficits. Normal DTRs. Cranial nerves intact  ASSESSMENT:  1. Iron deficiency anemia. Rule out GI mucosal abnormality 2. Chronic GERD. Ongoing. Requires PPI 3. Chronic recurrent  progressive epigastric pain. Etiology uncertain. Status post cholecystectomy. On PPI. No alarm features. 4. Minor rectal bleeding. 5. Morbid obesity 6. Fatty liver on imaging 7. Diabetes mellitus  PLAN:  1. Colonoscopy to evaluate iron deficiency anemia and minor rectal bleeding.The nature of the procedure, as well as the risks, benefits, and alternatives were carefully and thoroughly reviewed with the patient. Ample time for discussion and questions allowed. The patient understood, was satisfied, and agreed to proceed. 2. Previous outside records requested for review 3. Reflux precautions with attention to weight loss 4. Weight loss and exercise for fatty liver 5. Schedule upper endoscopy to evaluate epigastric pain and iron deficiency anemia.The nature of the procedure, as well as the risks, benefits, and alternatives were carefully and thoroughly reviewed with the patient. Ample time for discussion and questions allowed. The patient understood, was satisfied, and agreed to proceed. 6. Consider advanced imaging to evaluate the pancreas if above workup negative, regarding pain. 7. Iron replacement therapy 8. Hold diabetic medications the day of the endoscopic procedures in order to avoid and wanted hypoglycemia.  A copy of this consultation note has been sent to Dr. Derrel Nip

## 2016-06-22 NOTE — Patient Instructions (Signed)
We will call you as soon as possible to schedule your procedure.

## 2016-06-24 ENCOUNTER — Encounter: Payer: Self-pay | Admitting: Internal Medicine

## 2016-06-24 ENCOUNTER — Telehealth: Payer: Self-pay | Admitting: Internal Medicine

## 2016-06-24 NOTE — Telephone Encounter (Signed)
Left message for patient to return my call to schedule Colon/EGD with Dr. Henrene Pastor. Please see 06/22/16 OV note.

## 2016-07-05 ENCOUNTER — Other Ambulatory Visit: Payer: Self-pay

## 2016-07-05 DIAGNOSIS — K219 Gastro-esophageal reflux disease without esophagitis: Secondary | ICD-10-CM

## 2016-07-05 NOTE — Telephone Encounter (Signed)
This has not been prescribed in the past by you, please advise. Last OV 05/06/16?

## 2016-07-06 MED ORDER — ESOMEPRAZOLE MAGNESIUM 40 MG PO CPDR: 40.0000 mg | DELAYED_RELEASE_CAPSULE | Freq: Every day | ORAL | 1 refills | Status: DC

## 2016-07-06 NOTE — Telephone Encounter (Signed)
refilled 

## 2016-08-06 ENCOUNTER — Encounter: Payer: Self-pay | Admitting: Internal Medicine

## 2016-08-06 ENCOUNTER — Ambulatory Visit (INDEPENDENT_AMBULATORY_CARE_PROVIDER_SITE_OTHER): Payer: 59 | Admitting: Internal Medicine

## 2016-08-06 VITALS — BP 108/74 | HR 82 | Temp 98.1°F | Resp 16 | Ht 65.0 in | Wt 203.8 lb

## 2016-08-06 DIAGNOSIS — D259 Leiomyoma of uterus, unspecified: Secondary | ICD-10-CM

## 2016-08-06 DIAGNOSIS — E119 Type 2 diabetes mellitus without complications: Secondary | ICD-10-CM

## 2016-08-06 DIAGNOSIS — K760 Fatty (change of) liver, not elsewhere classified: Secondary | ICD-10-CM

## 2016-08-06 DIAGNOSIS — Z23 Encounter for immunization: Secondary | ICD-10-CM

## 2016-08-06 DIAGNOSIS — Z9049 Acquired absence of other specified parts of digestive tract: Secondary | ICD-10-CM

## 2016-08-06 DIAGNOSIS — E669 Obesity, unspecified: Secondary | ICD-10-CM

## 2016-08-06 DIAGNOSIS — R1011 Right upper quadrant pain: Secondary | ICD-10-CM

## 2016-08-06 LAB — COMPREHENSIVE METABOLIC PANEL
ALBUMIN: 4.3 g/dL (ref 3.5–5.2)
ALK PHOS: 84 U/L (ref 39–117)
ALT: 27 U/L (ref 0–35)
AST: 18 U/L (ref 0–37)
BUN: 11 mg/dL (ref 6–23)
CHLORIDE: 104 meq/L (ref 96–112)
CO2: 29 mEq/L (ref 19–32)
Calcium: 9.4 mg/dL (ref 8.4–10.5)
Creatinine, Ser: 0.62 mg/dL (ref 0.40–1.20)
GFR: 105.86 mL/min (ref >60.00)
Glucose, Bld: 156 mg/dL — ABNORMAL HIGH (ref 70–99)
POTASSIUM: 4.1 meq/L (ref 3.5–5.1)
SODIUM: 140 meq/L (ref 135–145)
TOTAL PROTEIN: 7.2 g/dL (ref 6.0–8.3)
Total Bilirubin: 0.4 mg/dL (ref 0.2–1.2)

## 2016-08-06 LAB — C-REACTIVE PROTEIN: CRP: 0.7 mg/dL (ref 0.5–20.0)

## 2016-08-06 LAB — SEDIMENTATION RATE: Sed Rate: 5 mm/hr (ref 0–30)

## 2016-08-06 LAB — HEMOGLOBIN A1C: HEMOGLOBIN A1C: 7 % — AB (ref 4.6–6.5)

## 2016-08-06 LAB — LIPASE: LIPASE: 15 U/L (ref 11.0–59.0)

## 2016-08-06 NOTE — Progress Notes (Signed)
Subjective:  Patient ID: Miranda Huang, female    DOB: [birth year]  Age: 56 y.o. MRN: [id number]  CC: The primary encounter diagnosis was Right upper quadrant abdominal pain. Diagnoses of Diabetes mellitus without complication ([place]), NAFLD (nonalcoholic fatty liver disease), Need for hepatitis A and B vaccination, S/P laparoscopic cholecystectomy, Type 2 diabetes mellitus without complication, without long-term current use of insulin ([place]), Uterine leiomyoma, unspecified location, and Obesity (BMI 30.0-34.9) were also pertinent to this visit.  HPI Miranda Huang presents for  Follow up on multiple issues    Obesity:  losing weight  Steadily. 9 lbs since march   Following a low GI diet,  Walking 3/week for exercise .    2) abdominal pain recurrent ,  Getting worse,  Had a very painful episode over the weekend after overindulging  In food and alcohol. .  Another  Episode last night .  aggravated by red meat,  Sometimes salad,  Occurs right after she eats.  Lasts 30 minutes to an hour.  The dicylcomine has helped when she remembers to take it prior to eating.   Sees GI on July 13 .  The pain is accompanied by nausea,  Sometimes s diarrhea, stools are  never black.   sometimes normal consistency.  No change in color of urine.  S/p chole remotely;   [place] in march no CBD dilation .  3) DM Type 2: Blood sugars are 130 to 140 fasting.  Taking glipizide 1/2 tablet at night .  Morning dose stopped due to recurrent mid day hypoglycemia    Lab Results  Component Value Date   MICROALBUR 0.7 05/05/2016    Outpatient Medications Prior to Visit  Medication Sig Dispense Refill  . albuterol (PROVENTIL HFA;VENTOLIN HFA) 108 (90 Base) MCG/ACT inhaler Inhale 2 puffs into the lungs every 6 (six) hours as needed for wheezing or shortness of breath. 1 Inhaler 0  . ALPRAZolam (XANAX) 0.25 MG tablet TAKE ONE TABLET BY MOUTH AT BEDTIME AS NEEDED FOR ANXIETY 30 tablet 3  . escitalopram (LEXAPRO) 10 MG tablet TAKE 1 TO 2  TABLETS BY MOUTH DAILY 180 tablet 1  . esomeprazole (NEXIUM) 40 MG capsule Take 1 capsule (40 mg total) by mouth daily at 12 noon. 90 capsule 1  . fluticasone (FLONASE) 50 MCG/ACT nasal spray Place 2 sprays into both nostrils daily. 16 g 6  . furosemide (LASIX) 20 MG tablet Take 1 tablet by mouth daily as needed. Reported on 08/06/2015    . gabapentin (NEURONTIN) 100 MG capsule TAKE 1 CAPSULE BY MOUTH AT BEDTIME (Patient taking differently: TAKE 1-2 CAPSULES BY MOUTH AT BEDTIME) 15 capsule 0  . glipiZIDE (GLUCOTROL) 5 MG tablet Take 0.5 tablets (2.5 mg total) by mouth 2 (two) times daily before a meal. 60 tablet 3  . glucose blood test strip BAYER CONTOUR TEST (In Vitro Strip)  1 (one) Strip two times daily, and as needed for 0 days  Quantity: 100;  Refills: 12   Ordered :11-Jun-2014  Althea Charon ;  Started 11-Jun-2014 Active Comments: Medication taken as needed.    . hydrochlorothiazide (HYDRODIURIL) 25 MG tablet TAKE 1 TABLET BY MOUTH ONCE DAILY 90 tablet 4  . lisinopril (PRINIVIL,ZESTRIL) 5 MG tablet TAKE 1 TABLET BY MOUTH ONCE DAILY 90 tablet 3  . metFORMIN (GLUCOPHAGE-XR) 500 MG 24 hr tablet TAKE 2 TABLETS BY MOUTH WITH BREAKFAST 180 tablet 1  . sucralfate (CARAFATE) 1 g tablet Take 1 tablet (1 g total) by mouth 4 (four)  times daily -  with meals and at bedtime. 120 tablet 1  . Vitamin D, Ergocalciferol, (DRISDOL) 50000 units CAPS capsule Take 1 capsule (50,000 Units total) by mouth every 7 (seven) days. 4 capsule 1   No facility-administered medications prior to visit.     Review of Systems;  Patient denies headache, fevers, malaise, unintentional weight loss, skin rash, eye pain, sinus congestion and sinus pain, sore throat, dysphagia,  hemoptysis , cough, dyspnea, wheezing, chest pain, palpitations, orthopnea, edema, abdominal pain, nausea, melena, diarrhea, constipation, flank pain, dysuria, hematuria, urinary  Frequency, nocturia, numbness, tingling, seizures,  Focal weakness, Loss of  consciousness,  Tremor, insomnia, depression, anxiety, and suicidal ideation.      Objective:  BP 108/74 (BP Location: Left Arm, Patient Position: Sitting, Cuff Size: Normal)   Pulse 82   Temp 98.1 F (36.7 C) (Oral)   Resp 16   Ht 5\' 5"  (1.651 m)   Wt 203 lb 12.8 oz (92.4 kg)   SpO2 96%   BMI 33.91 kg/m   BP Readings from Last 3 Encounters:  08/06/16 108/74  06/22/16 110/70  05/06/16 128/80    Wt Readings from Last 3 Encounters:  08/06/16 203 lb 12.8 oz (92.4 kg)  06/22/16 206 lb 3.2 oz (93.5 kg)  05/06/16 212 lb 3.2 oz (96.3 kg)    General appearance: alert, cooperative and appears stated age Ears: normal TM's and external ear canals both ears Throat: lips, mucosa, and tongue normal; teeth and gums normal Neck: no adenopathy, no carotid bruit, supple, symmetrical, trachea midline and thyroid not enlarged, symmetric, no tenderness/mass/nodules Back: symmetric, no curvature. ROM normal. No CVA tenderness. Lungs: clear to auscultation bilaterally Heart: regular rate and rhythm, S1, S2 normal, no murmur, click, rub or gallop Abdomen: soft, non-tender; bowel sounds normal; no masses,  no organomegaly Pulses: 2+ and symmetric Skin: Skin color, texture, turgor normal. No rashes or lesions Lymph nodes: Cervical, supraclavicular, and axillary nodes normal.  Lab Results  Component Value Date   HGBA1C 7.0 (H) 08/06/2016   HGBA1C 7.3 (H) 05/05/2016   HGBA1C 6.6 (H) 08/08/2015    Lab Results  Component Value Date   CREATININE 0.62 08/06/2016   CREATININE 0.47 05/05/2016   CREATININE 0.51 08/08/2015    Lab Results  Component Value Date   WBC 5.7 05/05/2016   HGB 11.5 (L) 05/05/2016   HCT 35.2 (L) 05/05/2016   PLT 265.0 05/05/2016   GLUCOSE 156 (H) 08/06/2016   CHOL 192 05/05/2016   TRIG 193.0 (H) 05/05/2016   HDL 42.40 05/05/2016   LDLCALC 111 (H) 05/05/2016   ALT 27 08/06/2016   AST 18 08/06/2016   NA 140 08/06/2016   K 4.1 08/06/2016   CL 104 08/06/2016    CREATININE 0.62 08/06/2016   BUN 11 08/06/2016   CO2 29 08/06/2016   TSH 1.81 05/05/2016   HGBA1C 7.0 (H) 08/06/2016   MICROALBUR 0.7 05/05/2016    US Abdomen Complete  Result Date: 05/19/2016 CLINICAL DATA:  Recurrent pain. EXAM: ABDOMEN ULTRASOUND COMPLETE COMPARISON:  08/07/2008.  09/11/2007. FINDINGS: Gallbladder: No gallstones or wall thickening visualized. No sonographic Murphy sign noted by sonographer. Common bile duct: Diameter: 2.8 mm Liver: No focal lesion identified. Within normal limits in parenchymal echogenicity. IVC: No abnormality visualized. Pancreas: Visualized portion unremarkable. Spleen: Size and appearance within normal limits. Right Kidney: Length: 12.6 cm. Echogenicity within normal limits. No mass or hydronephrosis visualized. Left Kidney: Length: 12.3 cm. Echogenicity within normal limits. No mass or hydronephrosis visualized. Abdominal aorta:  No aneurysm visualized. Other findings: None. IMPRESSION: 1. Liver is echogenic consistent fatty infiltration and/or hepatocellular disease. 2. Cholecystectomy.  No biliary distention. Electronically Signed   By: Marcello Moores  Register   On: 05/19/2016 09:24    Assessment & Plan:   Problem List Items Addressed This Visit    S/P laparoscopic cholecystectomy    Her recurrent episodes o abd pain may be due to retained stone in CBD, .  MRCP ordered       Obesity (BMI 30.0-34.9)    I have addressed  BMI and recommended a low glycemic index diet utilizing smaller more frequent meals to increase metabolism.  I have also recommended that patient start exercising with a goal of 30 minutes of aerobic exercise a minimum of 5 days per week.         Fibroid uterus   Diabetes mellitus, type 2 ([place])    Currently well-controlled on current medications .  hemoglobin A1c is at goal of  7.0 . Patient is reminded to schedule an annual eye exam and foot exam is normal today. Patient has no microalbuminuria. Patient is tolerating ACE Inhibitor for  renal protection and hypertension   Lab Results  Component Value Date   HGBA1C 7.0 (H) 08/06/2016   Lab Results  Component Value Date   MICROALBUR 0.7 05/05/2016          Other Visit Diagnoses    Right upper quadrant abdominal pain    -  Primary   Relevant Orders   Comprehensive metabolic panel (Completed)   Lipase (Completed)   Sedimentation rate (Completed)   C-reactive protein (Completed)   MR ABDOMEN MRCP WO CONTRAST   Diabetes mellitus without complication (H[facility])       Relevant Orders   Hemoglobin A1c (Completed)   NAFLD (nonalcoholic fatty liver disease)       Relevant Orders   Hepatitis A hepatitis B combined vaccine IM (Completed)   Need for hepatitis A and B vaccination          I am having Ms. Castello maintain her glucose blood, furosemide, lisinopril, ALPRAZolam, gabapentin, hydrochlorothiazide, albuterol, escitalopram, fluticasone, metFORMIN, glipiZIDE, sucralfate, Vitamin D (Ergocalciferol), and esomeprazole.  No orders of the defined types were placed in this encounter.   There are no discontinued medications.  Follow-up: Return in about 6 months (around 02/05/2017) for 1 month RN for Hep A/B vaccine , follow up diabetes.   Crecencio Mc, MD

## 2016-08-06 NOTE — Patient Instructions (Addendum)
Benadyyl 12.5 to 25 mg  1 hour before bedtime for the PND Delsym for the cough Sudafed PE for the congestion (ok to take up to 30 mg if needed )  Continue zyrtec daily SALINE IRRIGATION (NEIL MEDS) ONCE DAILY DURING FLU AND POLLEN SEASON   CALL ME IF SYMPTOMS TO PROGRESS (FACIAL PAIN ,  EAR PAIN )  I am ordering an MRCP to make sure yo udo not have a pancreatic stricture

## 2016-08-08 ENCOUNTER — Encounter: Payer: Self-pay | Admitting: Internal Medicine

## 2016-08-08 DIAGNOSIS — D259 Leiomyoma of uterus, unspecified: Secondary | ICD-10-CM | POA: Insufficient documentation

## 2016-08-08 NOTE — Assessment & Plan Note (Addendum)
Currently well-controlled on current medications .  hemoglobin A1c is at goal of  7.0 . Patient is reminded to schedule an annual eye exam and foot exam is normal today. Patient has no microalbuminuria. Patient is tolerating ACE Inhibitor for renal protection and hypertension   Lab Results  Component Value Date   HGBA1C 7.0 (H) 08/06/2016   Lab Results  Component Value Date   MICROALBUR 0.7 05/05/2016

## 2016-08-08 NOTE — Assessment & Plan Note (Signed)
I have addressed  BMI and recommended a low glycemic index diet utilizing smaller more frequent meals to increase metabolism.  I have also recommended that patient start exercising with a goal of 30 minutes of aerobic exercise a minimum of 5 days per week.  

## 2016-08-08 NOTE — Assessment & Plan Note (Signed)
Her recurrent episodes o abd pain may be due to retained stone in CBD, .  MRCP ordered

## 2016-08-09 ENCOUNTER — Other Ambulatory Visit: Payer: Self-pay | Admitting: Internal Medicine

## 2016-08-09 DIAGNOSIS — H6690 Otitis media, unspecified, unspecified ear: Secondary | ICD-10-CM | POA: Insufficient documentation

## 2016-08-09 MED ORDER — PREDNISONE 10 MG PO TABS: ORAL_TABLET | ORAL | 0 refills | Status: DC

## 2016-08-09 MED ORDER — AMOXICILLIN-POT CLAVULANATE 875-125 MG PO TABS: 1.0000 | ORAL_TABLET | Freq: Two times a day (BID) | ORAL | 0 refills | Status: DC

## 2016-08-09 NOTE — Assessment & Plan Note (Signed)
Starting Augmentin and prednisone taper

## 2016-08-16 ENCOUNTER — Ambulatory Visit (INDEPENDENT_AMBULATORY_CARE_PROVIDER_SITE_OTHER): Payer: 59 | Admitting: Family

## 2016-08-16 ENCOUNTER — Encounter: Payer: Self-pay | Admitting: Family

## 2016-08-16 VITALS — BP 114/68 | HR 89 | Temp 98.7°F | Ht 65.0 in

## 2016-08-16 DIAGNOSIS — B379 Candidiasis, unspecified: Secondary | ICD-10-CM

## 2016-08-16 DIAGNOSIS — T3695XA Adverse effect of unspecified systemic antibiotic, initial encounter: Secondary | ICD-10-CM | POA: Diagnosis not present

## 2016-08-16 DIAGNOSIS — H66001 Acute suppurative otitis media without spontaneous rupture of ear drum, right ear: Secondary | ICD-10-CM

## 2016-08-16 MED ORDER — DOXYCYCLINE HYCLATE 100 MG PO TABS: 100.0000 mg | ORAL_TABLET | Freq: Two times a day (BID) | ORAL | 0 refills | Status: DC

## 2016-08-16 MED ORDER — PREDNISONE 10 MG PO TABS: ORAL_TABLET | ORAL | 0 refills | Status: DC

## 2016-08-16 MED ORDER — FLUCONAZOLE 150 MG PO TABS: 150.0000 mg | ORAL_TABLET | Freq: Once | ORAL | 1 refills | Status: AC

## 2016-08-16 NOTE — Progress Notes (Signed)
Subjective:    Patient ID: Miranda Huang, female    DOB: 1960/08/08, 56 y.o.   MRN: [id number]  CC: Miranda Huang is a 56 y.o. female who presents today for an acute visit.    HPI: CC: right ear pain x 10 days, waxing and waning.  Pain in right ear has improved, still feels hard to hear.  Right sided sinus pressure. Tried sudafed, neti pot with some resolve.   A 'little dizzy' when turns head to fast. No fever, HA, visin changes,  vertigo syncope.  Had been treated with augmentin and prednisone , with some improvement. Sore throat resolved. Still has pain in right ear.  Has yeast infection from augmentin.   Seasonal allergies- on zyrtec.       HISTORY:  Past Medical History:  Diagnosis Date  . Anxiety   . Depression   . Diabetes mellitus without complication ([place])   . Pre-diabetes   . Swelling    Past Surgical History:  Procedure Laterality Date  . [place]  . CHOLECYSTECTOMY  2009  . DILATION AND CURETTAGE OF UTERUS  2009  . TONSILLECTOMY AND ADENOIDECTOMY     Family History  Problem Relation Age of Onset  . Heart disease Father   . Alcohol abuse Father   . Anxiety disorder Father   . Pancreatitis Father   . Sudden death Father   . Diabetes Paternal Grandmother   . Breast cancer Maternal Aunt 48    Allergies: Codeine; Erythromycin; and Tussin cough  [dextromethorphan hbr] Current Outpatient Prescriptions on File Prior to Visit  Medication Sig Dispense Refill  . albuterol (PROVENTIL HFA;VENTOLIN HFA) 108 (90 Base) MCG/ACT inhaler Inhale 2 puffs into the lungs every 6 (six) hours as needed for wheezing or shortness of breath. 1 Inhaler 0  . ALPRAZolam (XANAX) 0.25 MG tablet TAKE ONE TABLET BY MOUTH AT BEDTIME AS NEEDED FOR ANXIETY 30 tablet 3  . amoxicillin-clavulanate (AUGMENTIN) 875-125 MG tablet Take 1 tablet by mouth 2 (two) times daily. 14 tablet 0  . escitalopram (LEXAPRO) 10 MG tablet TAKE 1 TO 2 TABLETS BY MOUTH DAILY 180 tablet 1    . esomeprazole (NEXIUM) 40 MG capsule Take 1 capsule (40 mg total) by mouth daily at 12 noon. 90 capsule 1  . fluticasone (FLONASE) 50 MCG/ACT nasal spray Place 2 sprays into both nostrils daily. 16 g 6  . furosemide (LASIX) 20 MG tablet Take 1 tablet by mouth daily as needed. Reported on 08/06/2015    . gabapentin (NEURONTIN) 100 MG capsule TAKE 1 CAPSULE BY MOUTH AT BEDTIME (Patient taking differently: TAKE 1-2 CAPSULES BY MOUTH AT BEDTIME) 15 capsule 0  . glipiZIDE (GLUCOTROL) 5 MG tablet Take 0.5 tablets (2.5 mg total) by mouth 2 (two) times daily before a meal. 60 tablet 3  . glucose blood test strip BAYER CONTOUR TEST (In Vitro Strip)  1 (one) Strip two times daily, and as needed for 0 days  Quantity: 100;  Refills: 12   Ordered :11-Jun-2014  Althea Charon ;  Started 11-Jun-2014 Active Comments: Medication taken as needed.    . hydrochlorothiazide (HYDRODIURIL) 25 MG tablet TAKE 1 TABLET BY MOUTH ONCE DAILY 90 tablet 4  . lisinopril (PRINIVIL,ZESTRIL) 5 MG tablet TAKE 1 TABLET BY MOUTH ONCE DAILY 90 tablet 3  . metFORMIN (GLUCOPHAGE-XR) 500 MG 24 hr tablet TAKE 2 TABLETS BY MOUTH WITH BREAKFAST 180 tablet 1  . sucralfate (CARAFATE) 1 g tablet Take 1 tablet (1 g  total) by mouth 4 (four) times daily -  with meals and at bedtime. 120 tablet 1  . Vitamin D, Ergocalciferol, (DRISDOL) 50000 units CAPS capsule Take 1 capsule (50,000 Units total) by mouth every 7 (seven) days. 4 capsule 1   No current facility-administered medications on file prior to visit.     Social History  Substance Use Topics  . Smoking status: Never Smoker  . Smokeless tobacco: Never Used  . Alcohol use 0.0 oz/week     Comment: occassionally 1-2 glasses of wine every other week     Review of Systems  Constitutional: Negative for chills and fever.  HENT: Positive for ear pain, sinus pain (right) and sinus pressure. Negative for ear discharge, facial swelling and sore throat.   Eyes: Negative for visual disturbance.   Respiratory: Negative for cough, shortness of breath and wheezing.   Cardiovascular: Negative for chest pain and palpitations.  Gastrointestinal: Negative for nausea and vomiting.  Neurological: Negative for headaches.      Objective:    BP 114/68   Pulse 89   Temp 98.7 F (37.1 C) (Oral)   Ht 5\' 5"  (1.651 m)   SpO2 98%    Physical Exam  Constitutional: She appears well-developed and well-nourished.  HENT:  Head: Normocephalic and atraumatic.  Right Ear: Hearing, external ear and ear canal normal. No drainage, swelling or tenderness. No foreign bodies. Tympanic membrane is erythematous. Tympanic membrane is not bulging. No middle ear effusion. No decreased hearing is noted.  Left Ear: Hearing, tympanic membrane, external ear and ear canal normal. No drainage, swelling or tenderness. No foreign bodies. Tympanic membrane is not erythematous and not bulging.  No middle ear effusion. No decreased hearing is noted.  Nose: No rhinorrhea. Right sinus exhibits maxillary sinus tenderness. Right sinus exhibits no frontal sinus tenderness. Left sinus exhibits no maxillary sinus tenderness and no frontal sinus tenderness.  Mouth/Throat: Uvula is midline, oropharynx is clear and moist and mucous membranes are normal. No oropharyngeal exudate, posterior oropharyngeal edema, posterior oropharyngeal erythema or tonsillar abscesses.  Eyes: Conjunctivae are normal.  Cardiovascular: Regular rhythm, normal heart sounds and normal pulses.   Pulmonary/Chest: Effort normal and breath sounds normal. She has no wheezes. She has no rhonchi. She has no rales.  Lymphadenopathy:       Head (right side): No submental, no submandibular, no tonsillar, no preauricular, no posterior auricular and no occipital adenopathy present.       Head (left side): No submental, no submandibular, no tonsillar, no preauricular, no posterior auricular and no occipital adenopathy present.    She has no cervical adenopathy.   Neurological: She is alert.  Skin: Skin is warm and dry.  Psychiatric: She has a normal mood and affect. Her speech is normal and behavior is normal. Thought content normal.  Vitals reviewed.      Assessment & Plan:    1. Acute suppurative otitis media of right ear without spontaneous rupture of tympanic membrane, recurrence not specified Afebrile and patient is well-appearing. Reassured the symptoms have improved somewhat however she still having right-sided pressure, ear pain. We jointly agreed to treat with a different antibiotic, doxycycline and to start prednisone if needed. Strongly encouraged probiotics with back-to-back antibiotics; risks explained to patient re: risks of diarrheal infection. Return precautions given. - doxycycline (VIBRA-TABS) 100 MG tablet; Take 1 tablet (100 mg total) by mouth 2 (two) times daily.  Dispense: 10 tablet; Refill: 0 - predniSONE (DELTASONE) 10 MG tablet; Take 40 mg by mouth on  day 1, then taper 10 mg daily until gone  Dispense: 10 tablet; Refill: 0  2. Antibiotic-induced yeast infection Empirically treat.  - fluconazole (DIFLUCAN) 150 MG tablet; Take 1 tablet (150 mg total) by mouth once. Take one tablet PO once. If continue to have symptoms, may take one tablet PO 3 days later.  Dispense: 2 tablet; Refill: 1   I have discontinued Ms. Toback's predniSONE. I am also having her start on fluconazole, doxycycline, and predniSONE. Additionally, I am having her maintain her glucose blood, furosemide, lisinopril, ALPRAZolam, gabapentin, hydrochlorothiazide, albuterol, escitalopram, fluticasone, metFORMIN, glipiZIDE, sucralfate, Vitamin D (Ergocalciferol), esomeprazole, and amoxicillin-clavulanate.   Meds ordered this encounter  Medications  . fluconazole (DIFLUCAN) 150 MG tablet    Sig: Take 1 tablet (150 mg total) by mouth once. Take one tablet PO once. If continue to have symptoms, may take one tablet PO 3 days later.    Dispense:  2 tablet    Refill:   1    Order Specific Question:   Supervising Provider    Answer:   TULLO, TERESA L [[id number]]  . doxycycline (VIBRA-TABS) 100 MG tablet    Sig: Take 1 tablet (100 mg total) by mouth 2 (two) times daily.    Dispense:  10 tablet    Refill:  0    Order Specific Question:   Supervising Provider    Answer:   Deborra Medina L [[id number]]  . predniSONE (DELTASONE) 10 MG tablet    Sig: Take 40 mg by mouth on day 1, then taper 10 mg daily until gone    Dispense:  10 tablet    Refill:  0    Order Specific Question:   Supervising Provider    Answer:   Crecencio Mc [[id number]]    Return precautions given.   Risks, benefits, and alternatives of the medications and treatment plan prescribed today were discussed, and patient expressed understanding.   Education regarding symptom management and diagnosis given to patient on AVS.  Continue to follow with Crecencio Mc, MD for routine health maintenance.   Miranda Huang and I agreed with plan.   [name], FNP

## 2016-08-16 NOTE — Progress Notes (Signed)
Pre visit review using our clinic review tool, if applicable. No additional management support is needed unless otherwise documented below in the visit note. 

## 2016-08-16 NOTE — Patient Instructions (Addendum)
Ensure to take probiotics while on antibiotics and also for 2 weeks after completion. It is important to re-colonize the gut with good bacteria and also to prevent any diarrheal infections associated with antibiotic use.   Tylenol for pain  Monitor blood sugars  Let me know if not better   Barotitis Media Barotitis media is inflammation of the middle ear. This condition occurs when an auditory tube (eustachian tube) is blocked in one or both ears. These tubes lead from the middle ear to the back of the nose (nasopharynx). This condition typically occurs when you experience changes in pressure, such as when flying or scuba diving. Untreated barotitis media may lead to damage or hearing loss (barotrauma), which may become permanent. What are the causes? This condition may be caused by changes in air pressure from:  Flying.  Scuba diving.  A nearby explosion.  What increases the risk? The following factors may make you more likely to develop this condition:  Middle ear infection.  Sinus infection.  A cold.  Environmental allergies.  Small eustachian tubes.  Recent ear surgery.  What are the signs or symptoms? Symptoms of this condition may include:  Ear pain.  Hearing loss.  In severe cases, symptoms can include:  Dizziness and nausea (vertigo).  Temporary facial paralysis.  How is this diagnosed? This condition is diagnosed based on:  A physical exam. Your health care provider may: ? Use a device (otoscope) to look into your ear canal and check your eardrum. ? Do a test that changes air pressure in the middle ear to check how well the eardrum moves and to see if the eustachian tube is working(tympanogram).  Your medical history.  In some cases, your health care provider may have you take a hearing test. You may also be referred to someone who specializes in ear treatment (otolaryngologist, "ENT"). How is this treated? This condition may be treated  with:  Medicines to relieve congestion in your nose, sinus, or upper respiratory tract (decongestants).  Techniques to equalize pressure (to "pop" your ears), such as: ? Yawning. ? Chewing gum. ? Swallowing.  In severe cases, you may need surgery to relieve your symptoms or to prevent future inflammation. Follow these instructions at home:  Take over-the-counter and prescription medicines only as told by your health care provider.  Do not put anything into your ears to clean or unplug them. Ear drops will not help.  Keep all follow-up visits as told by your health care provider. This is important. How is this prevented? Using these strategies may help to prevent barotitis media:  Chewing gum with frequent, forceful swallowing during takeoff and landing when flying.  Holding your nose and gently blowing to pop your ears for equalizing pressure changes. This forces air into the eustachian tube.  Yawning during air pressure changes.  Using a nasal decongestant about 30-60 minutes before flying, if you have nasal congestion.  Contact a health care provider if:  You have vertigo.  You have hearing loss.  Your symptoms do not get better or they get worse.  You have a fever. Get help right away if:  You have a severe headache, ear pain, and dizziness.  You have balance problems.  You cannot move or feel part of your face.  You have bloody or pus-like drainage from your ears. Summary  Barotitis media is inflammation of the middle ear.  This condition typically occurs when you experience changes in pressure, such as when flying or scuba diving.  You may be at a higher risk for this condition if you have small eustachian tubes, had recent ear surgery, or have allergies, a cold, or sinus or middle ear infection.  This condition may be treated with medicines or techniques to equalize pressure in your ears.  Strategies can be used to help prevent barotitis media. This  information is not intended to replace advice given to you by your health care provider. Make sure you discuss any questions you have with your health care provider. Document Released: 02/13/2000 Document Revised: 01/05/2016 Document Reviewed: 01/05/2016 Elsevier Interactive Patient Education  2017 [employer].

## 2016-08-19 ENCOUNTER — Encounter: Payer: Self-pay | Admitting: Internal Medicine

## 2016-08-19 ENCOUNTER — Ambulatory Visit
Admission: RE | Admit: 2016-08-19 | Discharge: 2016-08-19 | Disposition: A | Discharge status: Home / Self Care | Payer: 59 | Source: Ambulatory Visit | Attending: Internal Medicine | Admitting: Internal Medicine

## 2016-08-19 ENCOUNTER — Encounter: Payer: Self-pay | Admitting: Radiology

## 2016-08-19 DIAGNOSIS — Z9049 Acquired absence of other specified parts of digestive tract: Secondary | ICD-10-CM | POA: Diagnosis not present

## 2016-08-19 DIAGNOSIS — K760 Fatty (change of) liver, not elsewhere classified: Secondary | ICD-10-CM | POA: Insufficient documentation

## 2016-08-19 DIAGNOSIS — R1011 Right upper quadrant pain: Secondary | ICD-10-CM | POA: Insufficient documentation

## 2016-08-24 ENCOUNTER — Ambulatory Visit (AMBULATORY_SURGERY_CENTER): Payer: Self-pay | Admitting: *Deleted

## 2016-08-24 VITALS — Ht 65.0 in | Wt 209.2 lb

## 2016-08-24 DIAGNOSIS — D508 Other iron deficiency anemias: Secondary | ICD-10-CM

## 2016-08-24 MED ORDER — NA SULFATE-K SULFATE-MG SULF 17.5-3.13-1.6 GM/177ML PO SOLN: ORAL | 0 refills | Status: DC

## 2016-08-24 NOTE — Progress Notes (Signed)
No allergies to eggs or soy. No problems with anesthesia.  Pt given Emmi instructions for colonoscopy and Egd  No oxygen use  No diet drug use

## 2016-08-25 ENCOUNTER — Encounter: Payer: Self-pay | Admitting: Internal Medicine

## 2016-08-25 DIAGNOSIS — H524 Presbyopia: Secondary | ICD-10-CM | POA: Diagnosis not present

## 2016-08-25 DIAGNOSIS — H52223 Regular astigmatism, bilateral: Secondary | ICD-10-CM | POA: Diagnosis not present

## 2016-08-25 LAB — HM DIABETES EYE EXAM

## 2016-09-08 ENCOUNTER — Ambulatory Visit (INDEPENDENT_AMBULATORY_CARE_PROVIDER_SITE_OTHER): Payer: 59 | Admitting: *Deleted

## 2016-09-08 DIAGNOSIS — Z23 Encounter for immunization: Secondary | ICD-10-CM

## 2016-09-08 NOTE — Progress Notes (Signed)
Patient presented for the second dose in the Hep A/B series third and last injection due 02/08/17.

## 2016-09-10 ENCOUNTER — Other Ambulatory Visit: Payer: Self-pay

## 2016-09-10 ENCOUNTER — Encounter: Payer: Self-pay | Admitting: Internal Medicine

## 2016-09-10 ENCOUNTER — Other Ambulatory Visit (INDEPENDENT_AMBULATORY_CARE_PROVIDER_SITE_OTHER): Payer: 59

## 2016-09-10 ENCOUNTER — Ambulatory Visit (AMBULATORY_SURGERY_CENTER): Payer: 59 | Admitting: Internal Medicine

## 2016-09-10 VITALS — BP 122/64 | HR 76 | Temp 98.6°F | Resp 17 | Ht 65.0 in | Wt 209.0 lb

## 2016-09-10 DIAGNOSIS — K635 Polyp of colon: Secondary | ICD-10-CM

## 2016-09-10 DIAGNOSIS — D649 Anemia, unspecified: Secondary | ICD-10-CM | POA: Diagnosis not present

## 2016-09-10 DIAGNOSIS — D509 Iron deficiency anemia, unspecified: Secondary | ICD-10-CM

## 2016-09-10 DIAGNOSIS — Z1211 Encounter for screening for malignant neoplasm of colon: Secondary | ICD-10-CM | POA: Diagnosis not present

## 2016-09-10 DIAGNOSIS — K5730 Diverticulosis of large intestine without perforation or abscess without bleeding: Secondary | ICD-10-CM | POA: Diagnosis not present

## 2016-09-10 DIAGNOSIS — D122 Benign neoplasm of ascending colon: Secondary | ICD-10-CM

## 2016-09-10 LAB — CBC WITH DIFFERENTIAL/PLATELET
Basophils Absolute: 0.1 10*3/uL (ref 0.0–0.1)
Basophils Relative: 1.2 % (ref 0.0–3.0)
EOS ABS: 0.1 10*3/uL (ref 0.0–0.7)
Eosinophils Relative: 2.2 % (ref 0.0–5.0)
HCT: 38.7 % (ref 36.0–46.0)
HEMOGLOBIN: 12.8 g/dL (ref 12.0–15.0)
Lymphocytes Relative: 27.5 % (ref 12.0–46.0)
Lymphs Abs: 1.4 10*3/uL (ref 0.7–4.0)
MCHC: 33 g/dL (ref 30.0–36.0)
MCV: 79.7 fl (ref 78.0–100.0)
MONO ABS: 0.4 10*3/uL (ref 0.1–1.0)
Monocytes Relative: 9 % (ref 3.0–12.0)
NEUTROS PCT: 60.1 % (ref 43.0–77.0)
Neutro Abs: 2.9 10*3/uL (ref 1.4–7.7)
Platelets: 269 10*3/uL (ref 150.0–400.0)
RBC: 4.85 Mil/uL (ref 3.87–5.11)
RDW: 18.6 % — ABNORMAL HIGH (ref 11.5–15.5)
WBC: 4.9 10*3/uL (ref 4.0–10.5)

## 2016-09-10 LAB — FERRITIN: FERRITIN: 9.3 ng/mL — AB (ref 10.0–291.0)

## 2016-09-10 MED ORDER — SODIUM CHLORIDE 0.9 % IV SOLN: 500.0000 mL | INTRAVENOUS | Status: AC

## 2016-09-10 NOTE — Patient Instructions (Addendum)
YOU HAD AN ENDOSCOPIC PROCEDURE TODAY AT [facility]:   Refer to the procedure report that was given to you for any specific questions about what was found during the examination.  If the procedure report does not answer your questions, please call your gastroenterologist to clarify.  If you requested that your care partner not be given the details of your procedure findings, then the procedure report has been included in a sealed envelope for you to review at your convenience later.  YOU SHOULD EXPECT: Some feelings of bloating in the abdomen. Passage of more gas than usual.  Walking can help get rid of the air that was put into your GI tract during the procedure and reduce the bloating. If you had a lower endoscopy (such as a colonoscopy or flexible sigmoidoscopy) you may notice spotting of blood in your stool or on the toilet paper. If you underwent a bowel prep for your procedure, you may not have a normal bowel movement for a few days.  Please Note:  You might notice some irritation and congestion in your nose or some drainage.  This is from the oxygen used during your procedure.  There is no need for concern and it should clear up in a day or so.  SYMPTOMS TO REPORT IMMEDIATELY:   Following lower endoscopy (colonoscopy or flexible sigmoidoscopy):  Excessive amounts of blood in the stool  Significant tenderness or worsening of abdominal pains  Swelling of the abdomen that is new, acute  Fever of 100F or higher   Following upper endoscopy (EGD)  Vomiting of blood or coffee ground material  New chest pain or pain under the shoulder blades  Painful or persistently difficult swallowing  New shortness of breath  Fever of 100F or higher  Black, tarry-looking stools  For urgent or emergent issues, a gastroenterologist can be reached at any hour by calling [phone].   DIET:  We do recommend a small meal at first, but then you may proceed to your regular diet.  Drink  plenty of fluids but you should avoid alcoholic beverages for 24 hours.  ACTIVITY:  You should plan to take it easy for the rest of today and you should NOT DRIVE or use heavy machinery until tomorrow (because of the sedation medicines used during the test).    FOLLOW UP: Our staff will call the number listed on your records the next business day following your procedure to check on you and address any questions or concerns that you may have regarding the information given to you following your procedure. If we do not reach you, we will leave a message.  However, if you are feeling well and you are not experiencing any problems, there is no need to return our call.  We will assume that you have returned to your regular daily activities without incident.  If any biopsies were taken you will be contacted by phone or by letter within the next 1-3 weeks.  Please call us at [phone] if you have not heard about the biopsies in 3 weeks.    SIGNATURES/CONFIDENTIALITY: You and/or your care partner have signed paperwork which will be entered into your electronic medical record.  These signatures attest to the fact that that the information above on your After Visit Summary has been reviewed and is understood.  Full responsibility of the confidentiality of this discharge information lies with you and/or your care-partner.YOU HAD AN ENDOSCOPIC PROCEDURE TODAY AT [facility]:  Refer to the procedure report that was given to you for any specific questions about what was found during the examination.  If the procedure report does not answer your questions, please call your gastroenterologist to clarify.  If you requested that your care partner not be given the details of your procedure findings, then the procedure report has been included in a sealed envelope for you to review at your convenience later.  YOU SHOULD EXPECT: Some feelings of bloating in the abdomen. Passage of more gas than  usual.  Walking can help get rid of the air that was put into your GI tract during the procedure and reduce the bloating. If you had a lower endoscopy (such as a colonoscopy or flexible sigmoidoscopy) you may notice spotting of blood in your stool or on the toilet paper. If you underwent a bowel prep for your procedure, you may not have a normal bowel movement for a few days.  Please Note:  You might notice some irritation and congestion in your nose or some drainage.  This is from the oxygen used during your procedure.  There is no need for concern and it should clear up in a day or so.  SYMPTOMS TO REPORT IMMEDIATELY:   Following lower endoscopy (colonoscopy or flexible sigmoidoscopy):  Excessive amounts of blood in the stool  Significant tenderness or worsening of abdominal pains  Swelling of the abdomen that is new, acute  Fever of 100F or higher   Following upper endoscopy (EGD)  Vomiting of blood or coffee ground material  New chest pain or pain under the shoulder blades  Painful or persistently difficult swallowing  New shortness of breath  Fever of 100F or higher  Black, tarry-looking stools  For urgent or emergent issues, a gastroenterologist can be reached at any hour by calling [phone].   DIET:  We do recommend a small meal at first, but then you may proceed to your regular diet.  Drink plenty of fluids but you should avoid alcoholic beverages for 24 hours.  ACTIVITY:  You should plan to take it easy for the rest of today and you should NOT DRIVE or use heavy machinery until tomorrow (because of the sedation medicines used during the test).    FOLLOW UP: Our staff will call the number listed on your records the next business day following your procedure to check on you and address any questions or concerns that you may have regarding the information given to you following your procedure. If we do not reach you, we will leave a message.  However, if you are feeling  well and you are not experiencing any problems, there is no need to return our call.  We will assume that you have returned to your regular daily activities without incident.  If any biopsies were taken you will be contacted by phone or by letter within the next 1-3 weeks.  Please call us at [phone] if you have not heard about the biopsies in 3 weeks.    SIGNATURES/CONFIDENTIALITY: You and/or your care partner have signed paperwork which will be entered into your electronic medical record.  These signatures attest to the fact that that the information above on your After Visit Summary has been reviewed and is understood.  Full responsibility of the confidentiality of this discharge information lies with you and/or your care-partner.YOU HAD AN ENDOSCOPIC PROCEDURE TODAY AT [facility]:   Refer to the procedure report that was given to you for any  specific questions about what was found during the examination.  If the procedure report does not answer your questions, please call your gastroenterologist to clarify.  If you requested that your care partner not be given the details of your procedure findings, then the procedure report has been included in a sealed envelope for you to review at your convenience later.  YOU SHOULD EXPECT: Some feelings of bloating in the abdomen. Passage of more gas than usual.  Walking can help get rid of the air that was put into your GI tract during the procedure and reduce the bloating. If you had a lower endoscopy (such as a colonoscopy or flexible sigmoidoscopy) you may notice spotting of blood in your stool or on the toilet paper. If you underwent a bowel prep for your procedure, you may not have a normal bowel movement for a few days.  Please Note:  You might notice some irritation and congestion in your nose or some drainage.  This is from the oxygen used during your procedure.  There is no need for concern and it should clear up in a day or  so.  SYMPTOMS TO REPORT IMMEDIATELY:   Following lower endoscopy (colonoscopy or flexible sigmoidoscopy):  Excessive amounts of blood in the stool  Significant tenderness or worsening of abdominal pains  Swelling of the abdomen that is new, acute  Fever of 100F or higher   Following upper endoscopy (EGD)  Vomiting of blood or coffee ground material  New chest pain or pain under the shoulder blades  Painful or persistently difficult swallowing  New shortness of breath  Fever of 100F or higher  Black, tarry-looking stools  For urgent or emergent issues, a gastroenterologist can be reached at any hour by calling [phone].   DIET:  We do recommend a small meal at first, but then you may proceed to your regular diet.  Drink plenty of fluids but you should avoid alcoholic beverages for 24 hours.  ACTIVITY:  You should plan to take it easy for the rest of today and you should NOT DRIVE or use heavy machinery until tomorrow (because of the sedation medicines used during the test).    FOLLOW UP: Our staff will call the number listed on your records the next business day following your procedure to check on you and address any questions or concerns that you may have regarding the information given to you following your procedure. If we do not reach you, we will leave a message.  However, if you are feeling well and you are not experiencing any problems, there is no need to return our call.  We will assume that you have returned to your regular daily activities without incident.  If any biopsies were taken you will be contacted by phone or by letter within the next 1-3 weeks.  Please call us at [phone] if you have not heard about the biopsies in 3 weeks.    SIGNATURES/CONFIDENTIALITY: You and/or your care partner have signed paperwork which will be entered into your electronic medical record.  These signatures attest to the fact that that the information above on your After  Visit Summary has been reviewed and is understood.  Full responsibility of the confidentiality of this discharge information lies with you and/or your care-partner.   Polyp, diverticulosis, and hemorrhoid information given.  To lab for CBC and ferritin level today.  Resume iron therapy today.  Capsule endoscopy to be scheduled.  See Dr. Henrene Pastor in 6 weeks in  office.YOU HAD AN ENDOSCOPIC PROCEDURE TODAY AT [facility]:   Refer to the procedure report that was given to you for any specific questions about what was found during the examination.  If the procedure report does not answer your questions, please call your gastroenterologist to clarify.  If you requested that your care partner not be given the details of your procedure findings, then the procedure report has been included in a sealed envelope for you to review at your convenience later.  YOU SHOULD EXPECT: Some feelings of bloating in the abdomen. Passage of more gas than usual.  Walking can help get rid of the air that was put into your GI tract during the procedure and reduce the bloating. If you had a lower endoscopy (such as a colonoscopy or flexible sigmoidoscopy) you may notice spotting of blood in your stool or on the toilet paper. If you underwent a bowel prep for your procedure, you may not have a normal bowel movement for a few days.  Please Note:  You might notice some irritation and congestion in your nose or some drainage.  This is from the oxygen used during your procedure.  There is no need for concern and it should clear up in a day or so.  SYMPTOMS TO REPORT IMMEDIATELY:   Following lower endoscopy (colonoscopy or flexible sigmoidoscopy):  Excessive amounts of blood in the stool  Significant tenderness or worsening of abdominal pains  Swelling of the abdomen that is new, acute  Fever of 100F or higher   Following upper endoscopy (EGD)  Vomiting of blood or coffee ground material  New chest pain or  pain under the shoulder blades  Painful or persistently difficult swallowing  New shortness of breath  Fever of 100F or higher  Black, tarry-looking stools  For urgent or emergent issues, a gastroenterologist can be reached at any hour by calling [phone].   DIET:  We do recommend a small meal at first, but then you may proceed to your regular diet.  Drink plenty of fluids but you should avoid alcoholic beverages for 24 hours.  ACTIVITY:  You should plan to take it easy for the rest of today and you should NOT DRIVE or use heavy machinery until tomorrow (because of the sedation medicines used during the test).    FOLLOW UP: Our staff will call the number listed on your records the next business day following your procedure to check on you and address any questions or concerns that you may have regarding the information given to you following your procedure. If we do not reach you, we will leave a message.  However, if you are feeling well and you are not experiencing any problems, there is no need to return our call.  We will assume that you have returned to your regular daily activities without incident.  If any biopsies were taken you will be contacted by phone or by letter within the next 1-3 weeks.  Please call us at [phone] if you have not heard about the biopsies in 3 weeks.    SIGNATURES/CONFIDENTIALITY: You and/or your care partner have signed paperwork which will be entered into your electronic medical record.  These signatures attest to the fact that that the information above on your After Visit Summary has been reviewed and is understood.  Full responsibility of the confidentiality of this discharge information lies with you and/or your care-partner.

## 2016-09-10 NOTE — Progress Notes (Signed)
Called to room to assist during endoscopic procedure.  Patient ID and intended procedure confirmed with present staff. Received instructions for my participation in the procedure from the performing physician.  

## 2016-09-10 NOTE — Op Note (Signed)
Miranda Huang: Miranda Huang Procedure Date: 09/10/2016 8:10 AM MRN: [id number] Endoscopist: Docia Chuck. Henrene Pastor , MD Age: 56 Referring MD:  Date of Birth: 05/08/1960 Gender: Female Account #: [id number] Procedure:                Colonoscopy with cold snare polypectomy x 1 Indications:              Rectal bleeding, Iron deficiency anemia Medicines:                Monitored Anesthesia Care Procedure:                Pre-Anesthesia Assessment:                           - Prior to the procedure, a History and Physical                            was performed, and patient medications and                            allergies were reviewed. The patient's tolerance of                            previous anesthesia was also reviewed. The risks                            and benefits of the procedure and the sedation                            options and risks were discussed with the patient.                            All questions were answered, and informed consent                            was obtained. Prior Anticoagulants: The patient has                            taken no previous anticoagulant or antiplatelet                            agents. ASA Grade Assessment: II - A patient with                            mild systemic disease. After reviewing the risks                            and benefits, the patient was deemed in                            satisfactory condition to undergo the procedure.                           After obtaining informed consent, the colonoscope  was passed under direct vision. Throughout the                            procedure, the patient's blood pressure, pulse, and                            oxygen saturations were monitored continuously. The                            Colonoscope was introduced through the anus and                            advanced to the the cecum, identified by   appendiceal orifice and ileocecal valve. The                            terminal ileum, ileocecal valve, appendiceal                            orifice, and rectum were photographed. The quality                            of the bowel preparation was excellent. The                            colonoscopy was performed without difficulty. The                            patient tolerated the procedure well. The bowel                            preparation used was SUPREP. Scope In: 8:18:32 AM Scope Out: 8:33:22 AM Scope Withdrawal Time: 0 hours 12 minutes 42 seconds  Total Procedure Duration: 0 hours 14 minutes 50 seconds  Findings:                 A 1 mm polyp was found in the ascending colon. The                            polyp was removed with a cold snare. Resection and                            retrieval were complete.                           Multiple small and large-mouthed diverticula were                            found in the entire colon.                           Internal hemorrhoids were found during                            retroflexion. The hemorrhoids were medium-sized.  Normal terminal ileum. The exam was otherwise                            without abnormality on direct and retroflexion                            views. Complications:            No immediate complications. Estimated blood loss:                            None. Estimated Blood Loss:     Estimated blood loss: none. Impression:               - One 1 mm polyp in the ascending colon, removed                            with a cold snare. Resected and retrieved.                           - Diverticulosis in the entire examined colon.                           - Internal hemorrhoids.                           - Normal terminal ileum. The examination was                            otherwise normal on direct and retroflexion views. Recommendation:           - Repeat colonoscopy in  5-10 years for surveillance.                           - Patient has a contact number available for                            emergencies. The signs and symptoms of potential                            delayed complications were discussed with the                            patient. Return to normal activities tomorrow.                            Written discharge instructions were provided to the                            patient.                           - Resume previous diet.                           - Continue present medications.                           -  Await pathology results.                           - See EGD report from today [place]. Henrene Pastor, MD 09/10/2016 8:39:25 AM This report has been signed electronically.

## 2016-09-10 NOTE — Op Note (Signed)
[place] Patient Name: Miranda Huang Procedure Date: 09/10/2016 8:10 AM MRN: [id number] Endoscopist: Docia Chuck. Henrene Pastor , MD Age: 56 Referring MD:  Date of Birth: Oct 17, 1960 Gender: Female Account #: [id number] Procedure:                Upper GI endoscopy, with biopsies Indications:              Iron deficiency anemia Medicines:                Monitored Anesthesia Care Procedure:                Pre-Anesthesia Assessment:                           - Prior to the procedure, a History and Physical                            was performed, and patient medications and                            allergies were reviewed. The patient's tolerance of                            previous anesthesia was also reviewed. The risks                            and benefits of the procedure and the sedation                            options and risks were discussed with the patient.                            All questions were answered, and informed consent                            was obtained. Prior Anticoagulants: The patient has                            taken no previous anticoagulant or antiplatelet                            agents. ASA Grade Assessment: II - A patient with                            mild systemic disease. After reviewing the risks                            and benefits, the patient was deemed in                            satisfactory condition to undergo the procedure.                           After obtaining informed consent, the endoscope was  passed under direct vision. Throughout the                            procedure, the patient's blood pressure, pulse, and                            oxygen saturations were monitored continuously. The                            Endoscope was introduced through the mouth, and                            advanced to the second part of duodenum. The upper                            GI endoscopy was  accomplished without difficulty.                            The patient tolerated the procedure well. Scope In: Scope Out: Findings:                 The esophagus was normal.                           The stomach was normal, save a few small benign                            fundic gland type polyps.                           The examined duodenum was normal. Biopsies for                            histology were taken with a cold forceps for                            evaluation of celiac disease.                           The cardia and gastric fundus were normal on                            retroflexion. Complications:            No immediate complications. Estimated Blood Loss:     Estimated blood loss: none. Impression:               - Normal esophagus.                           - Normal stomach.                           - Normal examined duodenum. Biopsied. Recommendation:           - Patient has a contact number available for  emergencies. The signs and symptoms of potential                            delayed complications were discussed with the                            patient. Return to normal activities tomorrow.                            Written discharge instructions were provided to the                            patient.                           - Resume previous diet.                           - Continue present medications.                           - Await pathology results.                           - Resume iron therapy today                           - CBC and ferritin level today                           - Schedule capsule endoscopy as an outpatient "iron                            deficiency anemia"                           - Office follow-up with Dr. Henrene Pastor in about 6 weeks Docia Chuck. Henrene Pastor, MD 09/10/2016 8:48:35 AM This report has been signed electronically.

## 2016-09-10 NOTE — Progress Notes (Signed)
A and O x3. Report to RN. Tolerated MAC anesthesia well.Teeth unchanged after procedure.

## 2016-09-13 ENCOUNTER — Telehealth: Payer: Self-pay | Admitting: *Deleted

## 2016-09-13 NOTE — Telephone Encounter (Signed)
  Follow up Call-  Call back number 09/10/2016  Post procedure Call Back phone  # [phone]  Permission to leave phone message Yes  Some recent data might be hidden     Patient questions:  Do you have a fever, pain , or abdominal swelling? No. Pain Score  0 *  Have you tolerated food without any problems? Yes.    Have you been able to return to your normal activities? Yes.    Do you have any questions about your discharge instructions: Diet   No. Medications  No. Follow up visit  No.  Do you have questions or concerns about your Care? No.  Actions: * If pain score is 4 or above: No action needed, pain <4.

## 2016-09-14 ENCOUNTER — Telehealth: Payer: Self-pay

## 2016-09-14 DIAGNOSIS — D509 Iron deficiency anemia, unspecified: Secondary | ICD-10-CM

## 2016-09-14 NOTE — Telephone Encounter (Signed)
Called and left message for pt to call back regarding scheduling Capsule endo.

## 2016-09-15 ENCOUNTER — Encounter: Payer: Self-pay | Admitting: Internal Medicine

## 2016-09-15 NOTE — Telephone Encounter (Signed)
Pt scheduled for capsule endo 09/28/16@8am . Prep reviewed over the phone with patient. Printed instructions also mailed to pt. Pt verbalized understanding and referral in epic.

## 2016-09-16 ENCOUNTER — Other Ambulatory Visit: Payer: Self-pay

## 2016-09-16 MED ORDER — ESCITALOPRAM OXALATE 10 MG PO TABS: 10.0000 mg | ORAL_TABLET | Freq: Every day | ORAL | 1 refills | Status: DC

## 2016-09-27 ENCOUNTER — Telehealth: Payer: Self-pay | Admitting: Internal Medicine

## 2016-09-27 NOTE — Telephone Encounter (Signed)
Pts capsule endo rescheduled to 10/12/16@8am .

## 2016-10-12 ENCOUNTER — Ambulatory Visit (INDEPENDENT_AMBULATORY_CARE_PROVIDER_SITE_OTHER): Payer: 59 | Admitting: Internal Medicine

## 2016-10-12 ENCOUNTER — Encounter: Payer: Self-pay | Admitting: Internal Medicine

## 2016-10-12 DIAGNOSIS — D500 Iron deficiency anemia secondary to blood loss (chronic): Secondary | ICD-10-CM | POA: Diagnosis not present

## 2016-10-12 NOTE — Progress Notes (Signed)
Patient here for capsule endoscopy. Tolerated procedure. Verbalizes understanding of written and verbal instructions. Lot Number [id number] capsule ID [id number]

## 2016-10-13 ENCOUNTER — Other Ambulatory Visit: Payer: Self-pay | Admitting: Family

## 2016-10-13 ENCOUNTER — Ambulatory Visit
Admission: RE | Admit: 2016-10-13 | Discharge: 2016-10-13 | Disposition: A | Discharge status: Home / Self Care | Payer: PRIVATE HEALTH INSURANCE | Source: Ambulatory Visit | Attending: Family | Admitting: Family

## 2016-10-13 DIAGNOSIS — M25561 Pain in right knee: Secondary | ICD-10-CM | POA: Insufficient documentation

## 2016-10-13 DIAGNOSIS — M25571 Pain in right ankle and joints of right foot: Secondary | ICD-10-CM | POA: Diagnosis present

## 2016-10-13 DIAGNOSIS — M7989 Other specified soft tissue disorders: Secondary | ICD-10-CM | POA: Diagnosis not present

## 2016-10-13 DIAGNOSIS — W1830XA Fall on same level, unspecified, initial encounter: Secondary | ICD-10-CM | POA: Insufficient documentation

## 2016-10-13 DIAGNOSIS — R609 Edema, unspecified: Secondary | ICD-10-CM

## 2016-10-13 DIAGNOSIS — R52 Pain, unspecified: Secondary | ICD-10-CM

## 2016-10-13 DIAGNOSIS — M25562 Pain in left knee: Secondary | ICD-10-CM | POA: Insufficient documentation

## 2016-10-13 DIAGNOSIS — Y990 Civilian activity done for income or pay: Secondary | ICD-10-CM | POA: Insufficient documentation

## 2016-10-27 ENCOUNTER — Telehealth: Payer: Self-pay

## 2016-10-27 NOTE — Telephone Encounter (Signed)
Left message for pt to call back  °

## 2016-10-27 NOTE — Telephone Encounter (Signed)
-----   Message from Irene Shipper, MD sent at 10/26/2016  1:00 PM EDT ----- Regarding: Capsule endoscopy results Vaughan Basta, Please let the patient know that her capsule endoscopy does not show any significant abnormalities. She should be on iron and continue on iron for now. She has office follow-up to see me in early September. Please obtain CBC and ferritin prior to that visit. Convert to a phone note. Thank you Dr. Henrene Pastor

## 2016-10-29 NOTE — Telephone Encounter (Signed)
Left message for pt to call back  °

## 2016-11-03 ENCOUNTER — Encounter: Payer: Self-pay | Admitting: Internal Medicine

## 2016-11-03 ENCOUNTER — Other Ambulatory Visit (INDEPENDENT_AMBULATORY_CARE_PROVIDER_SITE_OTHER): Payer: 59

## 2016-11-03 ENCOUNTER — Ambulatory Visit (INDEPENDENT_AMBULATORY_CARE_PROVIDER_SITE_OTHER): Payer: 59 | Admitting: Internal Medicine

## 2016-11-03 VITALS — BP 122/60 | HR 84 | Ht 65.0 in | Wt 208.0 lb

## 2016-11-03 DIAGNOSIS — K219 Gastro-esophageal reflux disease without esophagitis: Secondary | ICD-10-CM | POA: Diagnosis not present

## 2016-11-03 DIAGNOSIS — K760 Fatty (change of) liver, not elsewhere classified: Secondary | ICD-10-CM | POA: Diagnosis not present

## 2016-11-03 DIAGNOSIS — D509 Iron deficiency anemia, unspecified: Secondary | ICD-10-CM

## 2016-11-03 DIAGNOSIS — E6609 Other obesity due to excess calories: Secondary | ICD-10-CM | POA: Diagnosis not present

## 2016-11-03 DIAGNOSIS — E66811 Obesity, class 1: Secondary | ICD-10-CM

## 2016-11-03 DIAGNOSIS — R1013 Epigastric pain: Secondary | ICD-10-CM

## 2016-11-03 LAB — FERRITIN: Ferritin: 5 ng/mL — ABNORMAL LOW (ref 10.0–291.0)

## 2016-11-03 LAB — CBC WITH DIFFERENTIAL/PLATELET
Basophils Absolute: 0.1 10*3/uL (ref 0.0–0.1)
Basophils Relative: 1.3 % (ref 0.0–3.0)
EOS PCT: 5.1 % — AB (ref 0.0–5.0)
Eosinophils Absolute: 0.2 10*3/uL (ref 0.0–0.7)
HEMATOCRIT: 35.4 % — AB (ref 36.0–46.0)
Hemoglobin: 11.6 g/dL — ABNORMAL LOW (ref 12.0–15.0)
Lymphocytes Relative: 25.6 % (ref 12.0–46.0)
Lymphs Abs: 1.2 10*3/uL (ref 0.7–4.0)
MCHC: 32.9 g/dL (ref 30.0–36.0)
MCV: 81 fl (ref 78.0–100.0)
MONOS PCT: 9.3 % (ref 3.0–12.0)
Monocytes Absolute: 0.4 10*3/uL (ref 0.1–1.0)
NEUTROS ABS: 2.8 10*3/uL (ref 1.4–7.7)
Neutrophils Relative %: 58.7 % (ref 43.0–77.0)
Platelets: 281 10*3/uL (ref 150.0–400.0)
RBC: 4.37 Mil/uL (ref 3.87–5.11)
RDW: 15.4 % (ref 11.5–15.5)
WBC: 4.8 10*3/uL (ref 4.0–10.5)

## 2016-11-03 NOTE — Progress Notes (Signed)
HISTORY OF PRESENT ILLNESS:  Miranda Huang is a 56 y.o. 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 See that dictation. Previous ultrasound had revealed fatty liver with evidence of prior cholecystectomy. Iron replacement therapy was initiated. Colonoscopy and upper endoscopy was subsequently performed 09/10/2016. Colonoscopy revealed pandiverticulosis, internal hemorrhoids, and a diminutive tubular adenoma. The terminal ileum was normal. Follow-up colonoscopy in 5 years recommended. Upper endoscopy was normal. Duodenal biopsies were unremarkable. No evidence for celiac disease. She was told to continue PPI for active GERD symptoms. Capsule endoscopy was subsequently performed. The examination was complete. No significant abnormalities. As well, negative abdominal MRI in June. She presents today for follow-up. Patient reports to me that she does have intermittent reflux symptoms and occasional upper abdominal discomfort. She is not taking her PPI regularly. As well, she is taking her iron therapy infrequently (about every third day) due to intolerance. No other complaints. Upon questioning the patient does report that she is a blood donor multiple times per year, and has been. No other complaints. She does have multiple appropriate questions.  REVIEW OF SYSTEMS:  All non-GI ROS negative except for arthritis, swelling of the feet  Past Medical History:  Diagnosis Date  . Allergy   . Anemia   . Anxiety   . Arthritis   . Depression   . Diabetes mellitus without complication ([place])   . Pre-diabetes   . Swelling     Past Surgical History:  Procedure Laterality Date  . [place]  . CHOLECYSTECTOMY  2009  . DILATION AND CURETTAGE OF UTERUS  2009  . TONSILLECTOMY AND ADENOIDECTOMY      Social History TAMIKIA CHOWNING  reports that she has never  smoked. She has never used smokeless tobacco. She reports that she drinks alcohol. She reports that she does not use drugs.  family history includes Alcohol abuse in her father; Anxiety disorder in her father; Brain cancer in her maternal uncle; Breast cancer (age of onset: 35) in her maternal aunt; Cancer in her maternal aunt; Diabetes in her paternal grandmother; Heart disease in her father; Pancreatitis in her father; Sudden death in her father.  Allergies  Allergen Reactions  . Codeine Itching    Pt pretreats with Benadryl  . Erythromycin Nausea And Vomiting    GI upset  . Tussin Cough  [Dextromethorphan Hbr] Other (See Comments)    "Wacky Dreams"       PHYSICAL EXAMINATION: Vital signs: BP 122/60   Pulse 84   Ht '5\' 5"'  (1.651 m)   Wt 208 lb (94.3 kg)   BMI 34.61 kg/m   Constitutional: generally well-appearing, no acute distress Psychiatric: alert and oriented x3, cooperative Eyes: extraocular movements intact, anicteric, conjunctiva pink Mouth: oral pharynx moist, no lesions Neck: supple no lymphadenopathy Cardiovascular: heart regular rate and rhythm, no murmur Lungs: clear to auscultation bilaterally Abdomen: soft,Obese, nontender, nondistended, no obvious ascites, no peritoneal signs, normal bowel sounds, no organomegaly Rectal:Omitted Extremities: no lower extremity edema bilaterally Skin: no lesions on visible extremities Neuro: No focal deficits.  ASSESSMENT:  #1. Iron deficiency anemia. Extensive GI workup as outlined without significant GI mucosal abnormalities. No evidence for iron absorption disorder. The patient's iron deficiency anemia is almost certainly on the basis of chronic blood donation #2. GERD. Negative EGD. Treat symptomatically #3. Fatty liver #4. Obesity #5. Incidental diverticulosis #6. Adenomatous colon  polyp #7. Intermittent epigastric discomfort. Status post previous colonoscopy. Most likely GERD equivalent or spasm.  PLAN:  #1. Repeat  CBC and ferritin today #2. Recommend iron supplementation as long as she wishes to be a blood donor. If she is intolerant to current iron preparation she may Dr. her PCP about alternative forms #3. Reflux precautions #4. Weight loss #5. Lowest dose of PPI to control GERD symptoms #7. Surveillance colonoscopy in 5 years #8. Interval GI follow-up as needed  ADDENDUM The patient's blood work returned. She has had recurrence of mild anemia. Ferritin and low. Told to resume iron at least daily  25 minutes spent face-to-face with the patient. Greater than 50% a time was use for counseling regarding her myriad of GI issues as outlined and the corresponding care plan as outlined.

## 2016-11-03 NOTE — Telephone Encounter (Signed)
Pt saw Dr. Henrene Pastor in the office today.

## 2016-11-03 NOTE — Patient Instructions (Signed)
Your physician has requested that you go to the basement for the following lab work before leaving today:  CBC, Ferritin  Stay on iron as long as you are a blood donor

## 2016-11-19 ENCOUNTER — Encounter: Payer: Self-pay | Admitting: Internal Medicine

## 2016-11-21 ENCOUNTER — Other Ambulatory Visit: Payer: Self-pay | Admitting: Internal Medicine

## 2016-11-21 DIAGNOSIS — E119 Type 2 diabetes mellitus without complications: Secondary | ICD-10-CM

## 2016-11-21 MED ORDER — GLUCOSE BLOOD VI STRP: ORAL_STRIP | 12 refills | Status: DC

## 2016-11-21 MED ORDER — METFORMIN HCL ER 500 MG PO TB24: ORAL_TABLET | ORAL | 1 refills | Status: DC

## 2017-01-24 ENCOUNTER — Encounter: Payer: Self-pay | Admitting: Family Medicine

## 2017-01-24 ENCOUNTER — Ambulatory Visit: Payer: Self-pay | Admitting: *Deleted

## 2017-01-24 ENCOUNTER — Telehealth: Payer: Self-pay | Admitting: Internal Medicine

## 2017-01-24 ENCOUNTER — Other Ambulatory Visit: Payer: Self-pay

## 2017-01-24 ENCOUNTER — Ambulatory Visit (INDEPENDENT_AMBULATORY_CARE_PROVIDER_SITE_OTHER): Payer: 59 | Admitting: Family Medicine

## 2017-01-24 VITALS — BP 118/72 | HR 87 | Temp 99.5°F | Wt 207.2 lb

## 2017-01-24 DIAGNOSIS — H66002 Acute suppurative otitis media without spontaneous rupture of ear drum, left ear: Secondary | ICD-10-CM

## 2017-01-24 DIAGNOSIS — J0100 Acute maxillary sinusitis, unspecified: Secondary | ICD-10-CM

## 2017-01-24 MED ORDER — AMOXICILLIN-POT CLAVULANATE 875-125 MG PO TABS: 1.0000 | ORAL_TABLET | Freq: Two times a day (BID) | ORAL | 0 refills | Status: DC

## 2017-01-24 NOTE — Progress Notes (Signed)
Patient ID: Miranda Huang, female   DOB: October 01, 1960, 56 y.o.   MRN: [id number]  PCP: Crecencio Mc, MD  Subjective:  Miranda Huang is a 56 y.o. year old very pleasant female patient who presents with symptoms including nasal congestion, rhinitis, sinus pressure, Left ear pain, sore throat, cough that is productive of yellow/brown. Fever of 101 F per patient  -started: 5 days ago  symptoms are worsening -previous treatments: Alka Seltzer cold and delsym have provided limited benefit. -sick contacts/travel/risks: denies flu exposure. No recent sick contact exposure, works in the hospital in [place], and influenza is UTD -Hx of: allergies She is not a smoker  ROS-denies SOB, NVD, tooth pain  Pertinent Past Medical History- reactive airway disease, T2DM  Medications- reviewed  Current Outpatient Medications  Medication Sig Dispense Refill  . albuterol (PROVENTIL HFA;VENTOLIN HFA) 108 (90 Base) MCG/ACT inhaler Inhale 2 puffs into the lungs every 6 (six) hours as needed for wheezing or shortness of breath. 1 Inhaler 0  . ALPRAZolam (XANAX) 0.25 MG tablet TAKE ONE TABLET BY MOUTH AT BEDTIME AS NEEDED FOR ANXIETY 30 tablet 3  . cetirizine (ZYRTEC) 5 MG tablet Take 5 mg by mouth daily.    Marland Kitchen escitalopram (LEXAPRO) 10 MG tablet Take 1-2 tablets (10-20 mg total) by mouth daily. 180 tablet 1  . esomeprazole (NEXIUM) 40 MG capsule Take 1 capsule (40 mg total) by mouth daily at 12 noon. 90 capsule 1  . gabapentin (NEURONTIN) 100 MG capsule TAKE 1 CAPSULE BY MOUTH AT BEDTIME (Patient taking differently: TAKE 1 CAPSULE BY MOUTH AT BEDTIME prn) 15 capsule 0  . glipiZIDE (GLUCOTROL) 5 MG tablet Take 0.5 tablets (2.5 mg total) by mouth 2 (two) times daily before a meal. 60 tablet 3  . glucose blood test strip BAYER CONTOUR TEST (In Vitro Strip)  1 (one) Strip two times daily, and as needed for 0 days  Quantity: 100;  Refills: 12   Ordered :11-Jun-2014  Althea Charon ;  Started 11-Jun-2014 Active Comments:  Medication taken as needed. 100 each 12  . hydrochlorothiazide (HYDRODIURIL) 25 MG tablet TAKE 1 TABLET BY MOUTH ONCE DAILY 90 tablet 4  . lisinopril (PRINIVIL,ZESTRIL) 5 MG tablet TAKE 1 TABLET BY MOUTH ONCE DAILY 90 tablet 3  . metFORMIN (GLUCOPHAGE-XR) 500 MG 24 hr tablet TAKE 2 TABLETS BY MOUTH WITH BREAKFAST 180 tablet 1  . Red Yeast Rice Extract (RED YEAST RICE PO) Take by mouth daily.    . sucralfate (CARAFATE) 1 g tablet Take 1 tablet (1 g total) by mouth 4 (four) times daily -  with meals and at bedtime. 120 tablet 1  . fluticasone (FLONASE) 50 MCG/ACT nasal spray Place 2 sprays into both nostrils daily. (Patient not taking: Reported on 08/24/2016) 16 g 6  . Probiotic Product (PROBIOTIC DAILY PO) Take by mouth daily.    . Vitamin D, Ergocalciferol, (DRISDOL) 50000 units CAPS capsule Take 1 capsule (50,000 Units total) by mouth every 7 (seven) days. (Patient not taking: Reported on 01/24/2017) 4 capsule 1   Current Facility-Administered Medications  Medication Dose Route Frequency Provider Last Rate Last Dose  . 0.9 %  sodium chloride infusion  500 mL Intravenous Continuous Irene Shipper, MD        Objective: BP 118/72   Pulse 87   Temp 99.5 F (37.5 C) (Oral)   Wt 207 lb 3.2 oz (94 kg)   SpO2 96%   BMI 34.48 kg/m  Gen: NAD, resting comfortably HEENT: Turbinates erythematous,  right TM normal,  Left TM erythematous and bulging, pharynx mildly erythematous with no tonsilar exudate or edema, + maxillary sinus tenderness; no frontal sinus tenderness noted CV: RRR no murmurs rubs or gallops Lungs: CTAB no crackles, wheeze, rhonchi Abdomen: soft/nontender/nondistended/normal bowel sounds. No rebound or guarding.  Ext: no edema Skin: warm, dry, no rash Neuro: grossly normal, moves all extremities  Assessment/Plan: 1. Acute suppurative otitis media of left ear without spontaneous rupture of tympanic membrane, recurrence not specified History and exam support treatment for AOM;  Augmentin chosen as this will also cover symptoms of sinusitis. History of T2DM and reactive airway noted. We discussed the importance of close follow up if symptoms do not improve with treatment.  - amoxicillin-clavulanate (AUGMENTIN) 875-125 MG tablet; Take 1 tablet by mouth 2 (two) times daily.  Dispense: 20 tablet; Refill: 0  2. Acute maxillary sinusitis, recurrence not specified  - amoxicillin-clavulanate (AUGMENTIN) 875-125 MG tablet; Take 1 tablet by mouth 2 (two) times daily.  Dispense: 20 tablet; Refill: 0  Finally, we reviewed reasons to return to care including if symptoms worsen or persist or new concerns arise- once again particularly shortness of breath or fever.    Laurita Quint, FNP

## 2017-01-24 NOTE — Telephone Encounter (Signed)
Sore throat on Friday and now with cough. Congestion in lungs. Coughing up brown sputum. Fever last night up to 101. Appointment scheduled per patient for this morning. Care advice given with verbal understanding.   Reason for Disposition . [1] Continuous (nonstop) coughing interferes with work or school AND [2] no improvement using cough treatment per Care Advice  Answer Assessment - Initial Assessment Questions 1. ONSET: "When did the cough begin?"     saturday 2. SEVERITY: "How bad is the cough today?"      Pretty bad 3. RESPIRATORY DISTRESS: "Describe your breathing."      no 4. FEVER: "Do you have a fever?" If so, ask: "What is your temperature, how was it measured, and when did it start?"     Fever 101 last night 5. SPUTUM: "Describe the color of your sputum" (clear, white, yellow, green)     Brown sputum this morning 6. HEMOPTYSIS: "Are you coughing up any blood?" If so ask: "How much?" (flecks, streaks, tablespoons, etc.)     no 7. CARDIAC HISTORY: "Do you have any history of heart disease?" (e.g., heart attack, congestive heart failure)      no 8. LUNG HISTORY: "Do you have any history of lung disease?"  (e.g., pulmonary embolus, asthma, emphysema)     no 9. PE RISK FACTORS: "Do you have a history of blood clots?" (or: recent major surgery, recent prolonged travel, bedridden )     no 10. OTHER SYMPTOMS: "Do you have any other symptoms?" (e.g., runny nose, wheezing, chest pain)       Runny nose, some wheezing 11. PREGNANCY: "Is there any chance you are pregnant?" "When was your last menstrual period?"       no 12. TRAVEL: "Have you traveled out of the country in the last month?" (e.g., travel history, exposures)       no  Protocols used: [facility]

## 2017-01-24 NOTE — Telephone Encounter (Signed)
scheduled for 11 am with NP, patient aware.

## 2017-01-24 NOTE — Telephone Encounter (Signed)
Copied from [place] [id number]. Topic: Quick Communication - See Telephone Encounter >> Jan 24, 2017  8:04 AM Synthia Innocent wrote: CRM for notification. See Telephone encounter for:  Patient having sore throat, cough, no appointments available. Do not want to go to another location.  Would like to speak with nurse.  01/24/17.

## 2017-01-24 NOTE — Patient Instructions (Signed)
Please drink plenty of water so that your urine is pale yellow or clear. Also, get plenty of rest, use tylenol as needed for discomfort and follow up if symptoms do not improve in 3 to 4 days, worsen, or you develop a fever >101. You may also use Mucinex DM for cough as needed.   Otitis Media, Adult Otitis media is redness, soreness, and puffiness (swelling) in the space just behind your eardrum (middle ear). It may be caused by allergies or infection. It often happens along with a cold. Follow these instructions at home:  Take your medicine as told. Finish it even if you start to feel better.  Only take over-the-counter or prescription medicines for pain, discomfort, or fever as told by your doctor.  Follow up with your doctor as told. Contact a doctor if:  You have otitis media only in one ear, or bleeding from your nose, or both.  You notice a lump on your neck.  You are not getting better in 3-5 days.  You feel worse instead of better. Get help right away if:  You have pain that is not helped with medicine.  You have puffiness, redness, or pain around your ear.  You get a stiff neck.  You cannot move part of your face (paralysis).  You notice that the bone behind your ear hurts when you touch it. This information is not intended to replace advice given to you by your health care provider. Make sure you discuss any questions you have with your health care provider. Document Released: 08/04/2007 Document Revised: 07/24/2015 Document Reviewed: 09/12/2012 Elsevier Interactive Patient Education  2017 [employer].

## 2017-02-07 ENCOUNTER — Ambulatory Visit: Payer: Self-pay | Admitting: Internal Medicine

## 2017-02-09 ENCOUNTER — Ambulatory Visit: Payer: Self-pay

## 2017-03-03 ENCOUNTER — Ambulatory Visit: Payer: Self-pay | Admitting: Nurse Practitioner

## 2017-03-03 ENCOUNTER — Encounter: Payer: Self-pay | Admitting: Nurse Practitioner

## 2017-03-03 ENCOUNTER — Other Ambulatory Visit: Payer: Self-pay | Admitting: Internal Medicine

## 2017-03-03 VITALS — BP 120/80 | Temp 98.7°F | Wt 210.0 lb

## 2017-03-03 DIAGNOSIS — J069 Acute upper respiratory infection, unspecified: Secondary | ICD-10-CM

## 2017-03-03 MED ORDER — FLUTICASONE PROPIONATE 50 MCG/ACT NA SUSP: 2.0000 | Freq: Every day | NASAL | 6 refills | Status: DC

## 2017-03-03 MED ORDER — ALBUTEROL SULFATE HFA 108 (90 BASE) MCG/ACT IN AERS: 2.0000 | INHALATION_SPRAY | Freq: Four times a day (QID) | RESPIRATORY_TRACT | 0 refills | Status: DC | PRN

## 2017-03-03 MED ORDER — PREDNISONE 20 MG PO TABS: 20.0000 mg | ORAL_TABLET | Freq: Two times a day (BID) | ORAL | 0 refills | Status: AC

## 2017-03-03 MED ORDER — AZITHROMYCIN 250 MG PO TABS: ORAL_TABLET | ORAL | 0 refills | Status: DC

## 2017-03-03 MED ORDER — PREDNISONE 10 MG PO TABS: ORAL_TABLET | ORAL | 0 refills | Status: DC

## 2017-03-03 NOTE — Progress Notes (Signed)
Subjective:     Miranda Huang is a 57 y.o. female here for evaluation of a cough. Onset of symptoms was last Friday 02/25/2017. Symptoms have been worsening since that time. The cough is productive and is aggravated by laying down. Associated symptoms include wheezing, sinus pressure and congestion. Patient does have a history of reactive airway and has been using her albuterol inhaler twice daily. She did not use her inhaler yet this morning. She does have a history of environmental allergens.  She has also been using Mucinex DM, Sudafed, not currently using her Flonase because she ran out.   Review of Systems Pertinent items noted in HPI and remainder of comprehensive ROS otherwise negative.    Objective:    Oxygen saturation 97% on room air BP 120/80   Temp 98.7 F (37.1 C)   Wt 210 lb (95.3 kg)   SpO2 97%   BMI 34.95 kg/m   General Appearance:    Alert, cooperative, no distress, appears stated age  Head:    Normocephalic, without obvious abnormality, atraumatic  Eyes:    PERRL, conjunctiva/corneas clear  Ears:    TM's dull bilaterally no erythema on inflammaiton  Nose:   Nasal passages erythematous clear drainage present   Throat:   Lips, mucosa, and tongue normal; teeth and gums normal PND noted   Neck:   Supple, symmetrical     Lungs:     Expiratory wheezing in bilateral upper lung fields, diminished bases no acute distress       Heart:    Regular rate and rhythm, S1 and S2 normal, no murmur, rub   or gallop                    Skin:   Skin color, texture, turgor normal, no rashes or lesions            Assessment:    Reactive Airway Disease   Upper Respiratory Infection  Plan:    Antibiotics per medication orders. Call if shortness of breath worsens, blood in sputum, change in character of cough, development of fever or chills, inability to maintain nutrition and hydration. Avoid exposure to tobacco smoke and fumes.

## 2017-03-03 NOTE — Patient Instructions (Addendum)
  Place cough patient instructions here.  

## 2017-03-18 ENCOUNTER — Other Ambulatory Visit (HOSPITAL_COMMUNITY)
Admission: RE | Admit: 2017-03-18 | Discharge: 2017-03-18 | Disposition: A | Discharge status: Home / Self Care | Payer: 59 | Source: Ambulatory Visit | Attending: Internal Medicine | Admitting: Internal Medicine

## 2017-03-18 ENCOUNTER — Encounter: Payer: Self-pay | Admitting: Internal Medicine

## 2017-03-18 ENCOUNTER — Ambulatory Visit (INDEPENDENT_AMBULATORY_CARE_PROVIDER_SITE_OTHER): Payer: 59 | Admitting: Internal Medicine

## 2017-03-18 VITALS — BP 120/72 | HR 75 | Temp 97.7°F | Resp 15 | Ht 65.0 in | Wt 211.4 lb

## 2017-03-18 DIAGNOSIS — Z0000 Encounter for general adult medical examination without abnormal findings: Secondary | ICD-10-CM

## 2017-03-18 DIAGNOSIS — Z124 Encounter for screening for malignant neoplasm of cervix: Secondary | ICD-10-CM

## 2017-03-18 DIAGNOSIS — D509 Iron deficiency anemia, unspecified: Secondary | ICD-10-CM | POA: Diagnosis not present

## 2017-03-18 DIAGNOSIS — Z119 Encounter for screening for infectious and parasitic diseases, unspecified: Secondary | ICD-10-CM | POA: Diagnosis not present

## 2017-03-18 DIAGNOSIS — Z23 Encounter for immunization: Secondary | ICD-10-CM | POA: Diagnosis not present

## 2017-03-18 DIAGNOSIS — Z114 Encounter for screening for human immunodeficiency virus [HIV]: Secondary | ICD-10-CM | POA: Diagnosis not present

## 2017-03-18 DIAGNOSIS — E7800 Pure hypercholesterolemia, unspecified: Secondary | ICD-10-CM

## 2017-03-18 DIAGNOSIS — E119 Type 2 diabetes mellitus without complications: Secondary | ICD-10-CM

## 2017-03-18 DIAGNOSIS — B3749 Other urogenital candidiasis: Secondary | ICD-10-CM

## 2017-03-18 DIAGNOSIS — I10 Essential (primary) hypertension: Secondary | ICD-10-CM

## 2017-03-18 LAB — MICROALBUMIN / CREATININE URINE RATIO
CREATININE, U: 48.7 mg/dL
Microalb Creat Ratio: 1.4 mg/g (ref 0.0–30.0)

## 2017-03-18 LAB — CBC WITH DIFFERENTIAL/PLATELET
BASOS PCT: 0.9 % (ref 0.0–3.0)
Basophils Absolute: 0.1 10*3/uL (ref 0.0–0.1)
EOS ABS: 0.1 10*3/uL (ref 0.0–0.7)
EOS PCT: 2.3 % (ref 0.0–5.0)
HCT: 39 % (ref 36.0–46.0)
Hemoglobin: 12.6 g/dL (ref 12.0–15.0)
Lymphocytes Relative: 23.3 % (ref 12.0–46.0)
Lymphs Abs: 1.4 10*3/uL (ref 0.7–4.0)
MCHC: 32.3 g/dL (ref 30.0–36.0)
MCV: 78.7 fl (ref 78.0–100.0)
Monocytes Absolute: 0.5 10*3/uL (ref 0.1–1.0)
Monocytes Relative: 7.9 % (ref 3.0–12.0)
NEUTROS ABS: 3.9 10*3/uL (ref 1.4–7.7)
Neutrophils Relative %: 65.6 % (ref 43.0–77.0)
PLATELETS: 235 10*3/uL (ref 150.0–400.0)
RBC: 4.96 Mil/uL (ref 3.87–5.11)
RDW: 18.4 % — AB (ref 11.5–15.5)
WBC: 6 10*3/uL (ref 4.0–10.5)

## 2017-03-18 LAB — HEMOGLOBIN A1C: HEMOGLOBIN A1C: 7.5 % — AB (ref 4.6–6.5)

## 2017-03-18 LAB — LIPID PANEL
CHOLESTEROL: 196 mg/dL (ref 0–200)
HDL: 44.7 mg/dL (ref >39.00)
LDL CALC: 124 mg/dL — AB (ref 0–99)
NonHDL: 151.11
TRIGLYCERIDES: 137 mg/dL (ref 0.0–149.0)
Total CHOL/HDL Ratio: 4
VLDL: 27.4 mg/dL (ref 0.0–40.0)

## 2017-03-18 LAB — COMPREHENSIVE METABOLIC PANEL
ALBUMIN: 4.1 g/dL (ref 3.5–5.2)
ALK PHOS: 92 U/L (ref 39–117)
ALT: 32 U/L (ref 0–35)
AST: 19 U/L (ref 0–37)
BUN: 14 mg/dL (ref 6–23)
CALCIUM: 9.1 mg/dL (ref 8.4–10.5)
CHLORIDE: 102 meq/L (ref 96–112)
CO2: 29 mEq/L (ref 19–32)
Creatinine, Ser: 0.55 mg/dL (ref 0.40–1.20)
GFR: 121.28 mL/min (ref >60.00)
Glucose, Bld: 157 mg/dL — ABNORMAL HIGH (ref 70–99)
POTASSIUM: 4.3 meq/L (ref 3.5–5.1)
Sodium: 136 mEq/L (ref 135–145)
TOTAL PROTEIN: 6.5 g/dL (ref 6.0–8.3)
Total Bilirubin: 0.4 mg/dL (ref 0.2–1.2)

## 2017-03-18 MED ORDER — BENZONATATE 200 MG PO CAPS: 200.0000 mg | ORAL_CAPSULE | Freq: Three times a day (TID) | ORAL | 1 refills | Status: DC | PRN

## 2017-03-18 MED ORDER — LISINOPRIL 5 MG PO TABS: 5.0000 mg | ORAL_TABLET | Freq: Every day | ORAL | 3 refills | Status: DC

## 2017-03-18 MED ORDER — NYSTATIN 100000 UNIT/GM EX CREA: 1.0000 "application " | TOPICAL_CREAM | Freq: Two times a day (BID) | CUTANEOUS | 0 refills | Status: DC

## 2017-03-18 NOTE — Patient Instructions (Signed)
Take your glipizide BEFORE YOU EAT BREAKFAST AND DINNER  DO NOT SKIP MEALS  TRY THE "JUST CRACK AN EGG" PRODUCT AND THE JIMMMY DEAN EGG SANDWICH (MADE WITH FRITATTAS,  NO BISCUIT)   Health Maintenance for Postmenopausal Women Menopause is a normal process in which your reproductive ability comes to an end. This process happens gradually over a span of months to years, usually between the ages of 12 and 67. Menopause is complete when you have missed 12 consecutive menstrual periods. It is important to talk with your health care provider about some of the most common conditions that affect postmenopausal women, such as heart disease, cancer, and bone loss (osteoporosis). Adopting a healthy lifestyle and getting preventive care can help to promote your health and wellness. Those actions can also lower your chances of developing some of these common conditions. What should I know about menopause? During menopause, you may experience a number of symptoms, such as:  Moderate-to-severe hot flashes.  Night sweats.  Decrease in sex drive.  Mood swings.  Headaches.  Tiredness.  Irritability.  Memory problems.  Insomnia.  Choosing to treat or not to treat menopausal changes is an individual decision that you make with your health care provider. What should I know about hormone replacement therapy and supplements? Hormone therapy products are effective for treating symptoms that are associated with menopause, such as hot flashes and night sweats. Hormone replacement carries certain risks, especially as you become older. If you are thinking about using estrogen or estrogen with progestin treatments, discuss the benefits and risks with your health care provider. What should I know about heart disease and stroke? Heart disease, heart attack, and stroke become more likely as you age. This may be due, in part, to the hormonal changes that your body experiences during menopause. These can affect how  your body processes dietary fats, triglycerides, and cholesterol. Heart attack and stroke are both medical emergencies. There are many things that you can do to help prevent heart disease and stroke:  Have your blood pressure checked at least every 1-2 years. High blood pressure causes heart disease and increases the risk of stroke.  If you are 31-92 years old, ask your health care provider if you should take aspirin to prevent a heart attack or a stroke.  Do not use any tobacco products, including cigarettes, chewing tobacco, or electronic cigarettes. If you need help quitting, ask your health care provider.  It is important to eat a healthy diet and maintain a healthy weight. ? Be sure to include plenty of vegetables, fruits, low-fat dairy products, and lean protein. ? Avoid eating foods that are high in solid fats, added sugars, or salt (sodium).  Get regular exercise. This is one of the most important things that you can do for your health. ? Try to exercise for at least 150 minutes each week. The type of exercise that you do should increase your heart rate and make you sweat. This is known as moderate-intensity exercise. ? Try to do strengthening exercises at least twice each week. Do these in addition to the moderate-intensity exercise.  Know your numbers.Ask your health care provider to check your cholesterol and your blood glucose. Continue to have your blood tested as directed by your health care provider.  What should I know about cancer screening? There are several types of cancer. Take the following steps to reduce your risk and to catch any cancer development as early as possible. Breast Cancer  Practice breast self-awareness. ?  This means understanding how your breasts normally appear and feel. ? It also means doing regular breast self-exams. Let your health care provider know about any changes, no matter how small.  If you are 53 or older, have a clinician do a breast exam  (clinical breast exam or CBE) every year. Depending on your age, family history, and medical history, it may be recommended that you also have a yearly breast X-ray (mammogram).  If you have a family history of breast cancer, talk with your health care provider about genetic screening.  If you are at high risk for breast cancer, talk with your health care provider about having an MRI and a mammogram every year.  Breast cancer (BRCA) gene test is recommended for women who have family members with BRCA-related cancers. Results of the assessment will determine the need for genetic counseling and BRCA1 and for BRCA2 testing. BRCA-related cancers include these types: ? Breast. This occurs in males or females. ? Ovarian. ? Tubal. This may also be called fallopian tube cancer. ? Cancer of the abdominal or pelvic lining (peritoneal cancer). ? Prostate. ? Pancreatic.  Cervical, Uterine, and Ovarian Cancer Your health care provider may recommend that you be screened regularly for cancer of the pelvic organs. These include your ovaries, uterus, and vagina. This screening involves a pelvic exam, which includes checking for microscopic changes to the surface of your cervix (Pap test).  For women ages 21-65, health care providers may recommend a pelvic exam and a Pap test every three years. For women ages 18-65, they may recommend the Pap test and pelvic exam, combined with testing for human papilloma virus (HPV), every five years. Some types of HPV increase your risk of cervical cancer. Testing for HPV may also be done on women of any age who have unclear Pap test results.  Other health care providers may not recommend any screening for nonpregnant women who are considered low risk for pelvic cancer and have no symptoms. Ask your health care provider if a screening pelvic exam is right for you.  If you have had past treatment for cervical cancer or a condition that could lead to cancer, you need Pap tests  and screening for cancer for at least 20 years after your treatment. If Pap tests have been discontinued for you, your risk factors (such as having a new sexual partner) need to be reassessed to determine if you should start having screenings again. Some women have medical problems that increase the chance of getting cervical cancer. In these cases, your health care provider may recommend that you have screening and Pap tests more often.  If you have a family history of uterine cancer or ovarian cancer, talk with your health care provider about genetic screening.  If you have vaginal bleeding after reaching menopause, tell your health care provider.  There are currently no reliable tests available to screen for ovarian cancer.  Lung Cancer Lung cancer screening is recommended for adults 31-26 years old who are at high risk for lung cancer because of a history of smoking. A yearly low-dose CT scan of the lungs is recommended if you:  Currently smoke.  Have a history of at least 30 pack-years of smoking and you currently smoke or have quit within the past 15 years. A pack-year is smoking an average of one pack of cigarettes per day for one year.  Yearly screening should:  Continue until it has been 15 years since you quit.  Stop if you develop  a health problem that would prevent you from having lung cancer treatment.  Colorectal Cancer  This type of cancer can be detected and can often be prevented.  Routine colorectal cancer screening usually begins at age 66 and continues through age 68.  If you have risk factors for colon cancer, your health care provider may recommend that you be screened at an earlier age.  If you have a family history of colorectal cancer, talk with your health care provider about genetic screening.  Your health care provider may also recommend using home test kits to check for hidden blood in your stool.  A small camera at the end of a tube can be used to  examine your colon directly (sigmoidoscopy or colonoscopy). This is done to check for the earliest forms of colorectal cancer.  Direct examination of the colon should be repeated every 5-10 years until age 53. However, if early forms of precancerous polyps or small growths are found or if you have a family history or genetic risk for colorectal cancer, you may need to be screened more often.  Skin Cancer  Check your skin from head to toe regularly.  Monitor any moles. Be sure to tell your health care provider: ? About any new moles or changes in moles, especially if there is a change in a mole's shape or color. ? If you have a mole that is larger than the size of a pencil eraser.  If any of your family members has a history of skin cancer, especially at a young age, talk with your health care provider about genetic screening.  Always use sunscreen. Apply sunscreen liberally and repeatedly throughout the day.  Whenever you are outside, protect yourself by wearing long sleeves, pants, a wide-brimmed hat, and sunglasses.  What should I know about osteoporosis? Osteoporosis is a condition in which bone destruction happens more quickly than new bone creation. After menopause, you may be at an increased risk for osteoporosis. To help prevent osteoporosis or the bone fractures that can happen because of osteoporosis, the following is recommended:  If you are 43-32 years old, get at least 1,000 mg of calcium and at least 600 mg of vitamin D per day.  If you are older than age 31 but younger than age 33, get at least 1,200 mg of calcium and at least 600 mg of vitamin D per day.  If you are older than age 65, get at least 1,200 mg of calcium and at least 800 mg of vitamin D per day.  Smoking and excessive alcohol intake increase the risk of osteoporosis. Eat foods that are rich in calcium and vitamin D, and do weight-bearing exercises several times each week as directed by your health care  provider. What should I know about how menopause affects my mental health? Depression may occur at any age, but it is more common as you become older. Common symptoms of depression include:  Low or sad mood.  Changes in sleep patterns.  Changes in appetite or eating patterns.  Feeling an overall lack of motivation or enjoyment of activities that you previously enjoyed.  Frequent crying spells.  Talk with your health care provider if you think that you are experiencing depression. What should I know about immunizations? It is important that you get and maintain your immunizations. These include:  Tetanus, diphtheria, and pertussis (Tdap) booster vaccine.  Influenza every year before the flu season begins.  Pneumonia vaccine.  Shingles vaccine.  Your health care provider may  also recommend other immunizations. This information is not intended to replace advice given to you by your health care provider. Make sure you discuss any questions you have with your health care provider. Document Released: 04/09/2005 Document Revised: 09/05/2015 Document Reviewed: 11/19/2014 [employer] Interactive Patient Education  2018 [employer].

## 2017-03-18 NOTE — Progress Notes (Signed)
Patient ID: ANNABEL GIBEAU, female    DOB: 13-Aug-1960  Age: 57 y.o. MRN: [id number]  The patient is here for annual  CPE WITH PAP and management of other chronic and acute problems.  DUE FOR HIV SCREEN PAP,  TDAP,  AND MAMMOGRAM   Colonoscopy July 2018  5 yr follow up   Lab Results  Component Value Date   HGBA1C 7.5 (H) 03/18/2017      Lab Results  Component Value Date   TSH 1.81 05/05/2016   EYE EXAM NEXT MONTH   The risk factors are reflected in the social history.  The roster of all physicians providing medical care to patient - is listed in the Snapshot section of the chart.  Activities of daily living:  The patient is 100% independent in all ADLs: dressing, toileting, feeding as well as independent mobility  Home safety : The patient has smoke detectors in the home. They wear seatbelts.  There are no firearms at home. There is no violence in the home.   There is no risks for hepatitis, STDs or HIV. There is no   history of blood transfusion. They have no travel history to infectious disease endemic areas of the world.  The patient has seen their dentist in the last six month. They have seen their eye doctor in the last year. They admit to slight hearing difficulty with regard to whispered voices and some television programs.  They have deferred audiologic testing in the last year.  They do not  have excessive sun exposure. Discussed the need for sun protection: hats, long sleeves and use of sunscreen if there is significant sun exposure.   Diet: the importance of a healthy diet is discussed. They do have a healthy diet.  The benefits of regular aerobic exercise were discussed. She walks 4 times per week ,  20 minutes.   Depression screen: there are no signs or vegative symptoms of depression- irritability, change in appetite, anhedonia, sadness/tearfullness.   The following portions of the patient's history were reviewed and updated as appropriate: allergies, current  medications, past family history, past medical history,  past surgical history, past social history  and problem list.  Visual acuity was not assessed per patient preference since she has regular follow up with her ophthalmologist. Hearing and body mass index were assessed and reviewed.   During the course of the visit the patient was educated and counseled about appropriate screening and preventive services including : fall prevention , diabetes screening, nutrition counseling, colorectal cancer screening, and recommended immunizations.    CC: The primary encounter diagnosis was Screening for cervical cancer. Diagnoses of Type 2 diabetes mellitus without complication, without long-term current use of insulin ([place]), Iron deficiency anemia, unspecified iron deficiency anemia type, Screening for HIV (human immunodeficiency virus), Need for prophylactic vaccination against hepatitis A and hepatitis B in adult, Hypercholesteremia, Candidiasis of perineum, Essential hypertension, and Encounter for preventive health examination were also pertinent to this visit.   1) Patient does not check blood sugars more than once a month,  Last one was 135 a month ago in a fasting state.  Does not recall any above 200 lr less than 80.  No complaints today.  Taking her medications as directed,  Not exercising on a regular basis but trying to lose weight.  Patient voices awareness  of the foods he/she needs to avoid,  And follows a low GI diet about 50% of the time.  Has not had an annual diabetic eye  exam.  Denies numbness and tingling in lower extremities.  Denies hypoglycemic symptoms.   History Shanaia has a past medical history of Allergy, Anemia, Anxiety, Arthritis, Depression, Diabetes mellitus without complication ([place]), Pre-diabetes, and Swelling.   She has a past surgical history that includes Cholecystectomy (2009); Cesarean section (1985 and 1990); Dilation and curettage of uterus (2009); and Tonsillectomy and  adenoidectomy.   Her family history includes Alcohol abuse in her father; Anxiety disorder in her father; Brain cancer in her maternal uncle; Breast cancer (age of onset: 85) in her maternal aunt; Cancer in her maternal aunt; Diabetes in her paternal grandmother; Heart disease in her father; Pancreatitis in her father; Sudden death in her father.She reports that  has never smoked. she has never used smokeless tobacco. She reports that she drinks alcohol. She reports that she does not use drugs.  Outpatient Medications Prior to Visit  Medication Sig Dispense Refill  . albuterol (PROVENTIL HFA;VENTOLIN HFA) 108 (90 Base) MCG/ACT inhaler Inhale 2 puffs into the lungs every 6 (six) hours as needed for wheezing or shortness of breath. 1 Inhaler 0  . ALPRAZolam (XANAX) 0.25 MG tablet TAKE ONE TABLET BY MOUTH AT BEDTIME AS NEEDED FOR ANXIETY 30 tablet 3  . cetirizine (ZYRTEC) 5 MG tablet Take 5 mg by mouth daily.    Marland Kitchen escitalopram (LEXAPRO) 10 MG tablet Take 1-2 tablets (10-20 mg total) by mouth daily. 180 tablet 1  . esomeprazole (NEXIUM) 40 MG capsule Take 1 capsule (40 mg total) by mouth daily at 12 noon. 90 capsule 1  . fluticasone (FLONASE) 50 MCG/ACT nasal spray Place 2 sprays into both nostrils daily. 16 g 6  . gabapentin (NEURONTIN) 100 MG capsule TAKE 1 CAPSULE BY MOUTH AT BEDTIME (Patient taking differently: TAKE 1 CAPSULE BY MOUTH AT BEDTIME prn) 15 capsule 0  . glipiZIDE (GLUCOTROL) 5 MG tablet Take 0.5 tablets (2.5 mg total) by mouth 2 (two) times daily before a meal. 60 tablet 3  . glucose blood test strip BAYER CONTOUR TEST (In Vitro Strip)  1 (one) Strip two times daily, and as needed for 0 days  Quantity: 100;  Refills: 12   Ordered :11-Jun-2014  Althea Charon ;  Started 11-Jun-2014 Active Comments: Medication taken as needed. 100 each 12  . metFORMIN (GLUCOPHAGE-XR) 500 MG 24 hr tablet TAKE 2 TABLETS BY MOUTH WITH BREAKFAST 180 tablet 1  . sucralfate (CARAFATE) 1 g tablet Take 1 tablet (1  g total) by mouth 4 (four) times daily -  with meals and at bedtime. 120 tablet 1  . hydrochlorothiazide (HYDRODIURIL) 25 MG tablet TAKE 1 TABLET BY MOUTH ONCE DAILY (Patient not taking: Reported on 03/18/2017) 90 tablet 4  . amoxicillin-clavulanate (AUGMENTIN) 875-125 MG tablet Take 1 tablet by mouth 2 (two) times daily. (Patient not taking: Reported on 03/03/2017) 20 tablet 0  . azithromycin (ZITHROMAX) 250 MG tablet Take 2 tablets on day one, one tablet daily on days 2-5. (Patient not taking: Reported on 03/18/2017) 6 tablet 0  . lisinopril (PRINIVIL,ZESTRIL) 5 MG tablet TAKE 1 TABLET BY MOUTH ONCE DAILY (Patient not taking: Reported on 03/18/2017) 90 tablet 3  . predniSONE (DELTASONE) 10 MG tablet 6 tablets on Day 1 , then reduce by 1 tablet daily until gone (Patient not taking: Reported on 03/18/2017) 21 tablet 0  . Probiotic Product (PROBIOTIC DAILY PO) Take by mouth daily.    . Red Yeast Rice Extract (RED YEAST RICE PO) Take by mouth daily.    . Vitamin D, Ergocalciferol, (  DRISDOL) 50000 units CAPS capsule Take 1 capsule (50,000 Units total) by mouth every 7 (seven) days. (Patient not taking: Reported on 03/18/2017) 4 capsule 1   Facility-Administered Medications Prior to Visit  Medication Dose Route Frequency Provider Last Rate Last Dose  . 0.9 %  sodium chloride infusion  500 mL Intravenous Continuous Irene Shipper, MD        Review of Systems   Patient denies headache, fevers, malaise, unintentional weight loss, skin rash, eye pain, sinus congestion and sinus pain, sore throat, dysphagia,  hemoptysis , cough, dyspnea, wheezing, chest pain, palpitations, orthopnea, edema, abdominal pain, nausea, melena, diarrhea, constipation, flank pain, dysuria, hematuria, urinary  Frequency, nocturia, numbness, tingling, seizures,  Focal weakness, Loss of consciousness,  Tremor, insomnia, depression, anxiety, and suicidal ideation.      Objective:  BP 120/72 (BP Location: Left Arm, Patient Position:  Sitting, Cuff Size: Normal)   Pulse 75   Temp 97.7 F (36.5 C) (Oral)   Resp 15   Ht 5\' 5"  (1.651 m)   Wt 211 lb 6.4 oz (95.9 kg)   SpO2 96%   BMI 35.18 kg/m   Physical Exam   General Appearance:    Alert, cooperative, no distress, appears stated age  Head:    Normocephalic, without obvious abnormality, atraumatic  Eyes:    PERRL, conjunctiva/corneas clear, EOM's intact, fundi    benign, both eyes  Ears:    Normal TM's and external ear canals, both ears  Nose:   Nares normal, septum midline, mucosa normal, no drainage    or sinus tenderness  Throat:   Lips, mucosa, and tongue normal; teeth and gums normal  Neck:   Supple, symmetrical, trachea midline, no adenopathy;    thyroid:  no enlargement/tenderness/nodules; no carotid   bruit or JVD  Back:     Symmetric, no curvature, ROM normal, no CVA tenderness  Lungs:     Clear to auscultation bilaterally, respirations unlabored  Chest Wall:    No tenderness or deformity   Heart:    Regular rate and rhythm, S1 and S2 normal, no murmur, rub   or gallop  Breast Exam:    No tenderness, masses, or nipple abnormality  Abdomen:     Soft, non-tender, bowel sounds active all four quadrants,    no masses, no organomegaly  Genitalia:    Pelvic: cervix normal in appearance, external genitalia normal, no adnexal masses or tenderness, no cervical motion tenderness, rectovaginal septum normal, uterus normal size, shape, and consistency and vagina normal without discharge  Extremities:   Extremities normal, atraumatic, no cyanosis or edema  Pulses:   2+ and symmetric all extremities  Skin:   Skin color, texture, turgor normal, no rashes or lesions  Lymph nodes:   Cervical, supraclavicular, and axillary nodes normal  Neurologic:   CNII-XII intact, normal strength, sensation and reflexes    throughout       Assessment & Plan:   Problem List Items Addressed This Visit    Candidiasis of perineum    Nystatin cream ordered.       Relevant  Medications   nystatin cream (MYCOSTATIN)   Diabetes mellitus, type 2 (HCC)    Loss of control on current medications, metformin and glipizide recommend adding victoza given need for weight loss  . Patient is reminded to schedule an annual eye exam and foot exam is normal today. Patient has no microalbuminuria. Patient is tolerating ACE Inhibitor for renal protection and hypertension   Lab Results  Component  Value Date   HGBA1C 7.5 (H) 03/18/2017   Lab Results  Component Value Date   MICROALBUR <0.7 03/18/2017         Relevant Medications   lisinopril (PRINIVIL,ZESTRIL) 5 MG tablet   Other Relevant Orders   Hemoglobin A1c (Completed)   Lipid panel (Completed)   Comprehensive metabolic panel (Completed)   Microalbumin / creatinine urine ratio (Completed)   Encounter for preventive health examination    Annual comprehensive preventive exam was done as well as an evaluation and management of chronic conditions .  During the course of the visit the patient was educated and counseled about appropriate screening and preventive services including :  diabetes screening, lipid analysis with projected  10 year  risk for CAD , nutrition counseling, breast, cervical and colorectal cancer screening, and recommended immunizations.  Printed recommendations for health maintenance screenings was given      Essential hypertension    Well controlled on current regimen. Renal function stable, no changes today.  Lab Results  Component Value Date   CREATININE 0.55 03/18/2017   Lab Results  Component Value Date   NA 136 03/18/2017   K 4.3 03/18/2017   CL 102 03/18/2017   CO2 29 03/18/2017         Relevant Medications   lisinopril (PRINIVIL,ZESTRIL) 5 MG tablet   Hypercholesteremia    10 yr risk is 14% using the FRC.  Will repeat in 3 months and recommend statin therapy if  LDL is not < 70 on RYR   Lab Results  Component Value Date   CHOL 196 03/18/2017   HDL 44.70 03/18/2017   LDLCALC  124 (H) 03/18/2017   TRIG 137.0 03/18/2017   CHOLHDL 4 03/18/2017         Relevant Medications   lisinopril (PRINIVIL,ZESTRIL) 5 MG tablet   Iron deficiency anemia    Normal EG, negative capsule study. Repeat labs are normal.   Lab Results  Component Value Date   IRON 60 03/18/2017   TIBC 387 03/18/2017   FERRITIN 12 03/18/2017   Lab Results  Component Value Date   WBC 6.0 03/18/2017   HGB 12.6 03/18/2017   HCT 39.0 03/18/2017   MCV 78.7 03/18/2017   PLT 235.0 03/18/2017          Relevant Orders   Comprehensive metabolic panel (Completed)   Iron, TIBC and Ferritin Panel (Completed)   CBC with Differential/Platelet (Completed)    Other Visit Diagnoses    Screening for cervical cancer    -  Primary   Relevant Orders   Cytology - PAP   Comprehensive metabolic panel (Completed)   Screening for HIV (human immunodeficiency virus)       Relevant Orders   HIV antibody (Completed)   Need for prophylactic vaccination against hepatitis A and hepatitis B in adult       Relevant Orders   Hepatitis A hepatitis B combined vaccine IM (Completed)      I have discontinued J[employer]on [employer]. [employer]'s Vitamin D (Ergocalciferol), Red Yeast Rice Extract (RED YEAST RICE PO), Probiotic Product (PROBIOTIC DAILY PO), amoxicillin-clavulanate, azithromycin, and predniSONE. I have also changed her lisinopril. Additionally, I am having her start on benzonatate and nystatin cream. Lastly, I am having her maintain her ALPRAZolam, gabapentin, hydrochlorothiazide, glipiZIDE, sucralfate, esomeprazole, cetirizine, escitalopram, metFORMIN, glucose blood, albuterol, and fluticasone. We will continue to administer sodium chloride.  Meds ordered this encounter  Medications  . benzonatate (TESSALON) 200 MG capsule    Sig: Take 1  capsule (200 mg total) by mouth 3 (three) times daily as needed for cough.    Dispense:  60 capsule    Refill:  1  . nystatin cream (MYCOSTATIN)    Sig: Apply 1 application topically  2 (two) times daily.    Dispense:  30 g    Refill:  0  . lisinopril (PRINIVIL,ZESTRIL) 5 MG tablet    Sig: Take 1 tablet (5 mg total) by mouth daily.    Dispense:  90 tablet    Refill:  3    Medications Discontinued During This Encounter  Medication Reason  . amoxicillin-clavulanate (AUGMENTIN) 875-125 MG tablet Completed Course  . azithromycin (ZITHROMAX) 250 MG tablet Completed Course  . predniSONE (DELTASONE) 10 MG tablet Completed Course  . Probiotic Product (PROBIOTIC DAILY PO) Patient has not taken in last 30 days  . Red Yeast Rice Extract (RED YEAST RICE PO) Patient has not taken in last 30 days  . Vitamin D, Ergocalciferol, (DRISDOL) 50000 units CAPS capsule Completed Course  . lisinopril (PRINIVIL,ZESTRIL) 5 MG tablet Reorder    Follow-up: No Follow-up on file.   Crecencio Mc, MD

## 2017-03-19 ENCOUNTER — Encounter: Payer: Self-pay | Admitting: Internal Medicine

## 2017-03-19 DIAGNOSIS — I10 Essential (primary) hypertension: Secondary | ICD-10-CM | POA: Insufficient documentation

## 2017-03-19 DIAGNOSIS — Z0000 Encounter for general adult medical examination without abnormal findings: Secondary | ICD-10-CM | POA: Insufficient documentation

## 2017-03-19 DIAGNOSIS — B3749 Other urogenital candidiasis: Secondary | ICD-10-CM | POA: Insufficient documentation

## 2017-03-19 LAB — IRON,TIBC AND FERRITIN PANEL
%SAT: 16 % (calc) (ref 11–50)
Ferritin: 12 ng/mL (ref 10–232)
IRON: 60 ug/dL (ref 45–160)
TIBC: 387 ug/dL (ref 250–450)

## 2017-03-19 LAB — HIV ANTIBODY (ROUTINE TESTING W REFLEX): HIV 1&2 Ab, 4th Generation: NONREACTIVE

## 2017-03-19 NOTE — Assessment & Plan Note (Signed)
Well controlled on current regimen. Renal function stable, no changes today.  Lab Results  Component Value Date   CREATININE 0.55 03/18/2017   Lab Results  Component Value Date   NA 136 03/18/2017   K 4.3 03/18/2017   CL 102 03/18/2017   CO2 29 03/18/2017

## 2017-03-19 NOTE — Assessment & Plan Note (Addendum)
Normal EG, negative capsule study. Repeat labs are normal.   Lab Results  Component Value Date   IRON 60 03/18/2017   TIBC 387 03/18/2017   FERRITIN 12 03/18/2017   Lab Results  Component Value Date   WBC 6.0 03/18/2017   HGB 12.6 03/18/2017   HCT 39.0 03/18/2017   MCV 78.7 03/18/2017   PLT 235.0 03/18/2017

## 2017-03-19 NOTE — Assessment & Plan Note (Signed)
Annual comprehensive preventive exam was done as well as an evaluation and management of chronic conditions .  During the course of the visit the patient was educated and counseled about appropriate screening and preventive services including :  diabetes screening, lipid analysis with projected  10 year  risk for CAD , nutrition counseling, breast, cervical and colorectal cancer screening, and recommended immunizations.  Printed recommendations for health maintenance screenings was given 

## 2017-03-19 NOTE — Assessment & Plan Note (Signed)
Loss of control on current medications, metformin and glipizide recommend adding victoza given need for weight loss  . Patient is reminded to schedule an annual eye exam and foot exam is normal today. Patient has no microalbuminuria. Patient is tolerating ACE Inhibitor for renal protection and hypertension   Lab Results  Component Value Date   HGBA1C 7.5 (H) 03/18/2017   Lab Results  Component Value Date   MICROALBUR <0.7 03/18/2017

## 2017-03-19 NOTE — Assessment & Plan Note (Signed)
Nystatin cream ordered

## 2017-03-19 NOTE — Assessment & Plan Note (Addendum)
10 yr risk is 14% using the FRC.  Will repeat in 3 months and recommend statin therapy if  LDL is not < 70 on RYR   Lab Results  Component Value Date   CHOL 196 03/18/2017   HDL 44.70 03/18/2017   LDLCALC 124 (H) 03/18/2017   TRIG 137.0 03/18/2017   CHOLHDL 4 03/18/2017

## 2017-03-21 MED ORDER — LISINOPRIL 5 MG PO TABS: 5.0000 mg | ORAL_TABLET | Freq: Every day | ORAL | 3 refills | Status: DC

## 2017-03-21 NOTE — Telephone Encounter (Signed)
The pt stated that a cream for the yeast infection would be called in. Lisinopril has been sent in.

## 2017-03-22 ENCOUNTER — Encounter: Payer: Self-pay | Admitting: Internal Medicine

## 2017-03-22 ENCOUNTER — Other Ambulatory Visit: Payer: Self-pay | Admitting: Internal Medicine

## 2017-03-22 LAB — CYTOLOGY - PAP
DIAGNOSIS: NEGATIVE
HPV (WINDOPATH): NOT DETECTED

## 2017-03-22 MED ORDER — PEN NEEDLES 31G X 6 MM MISC: 0 refills | Status: DC

## 2017-03-22 MED ORDER — LIRAGLUTIDE 18 MG/3ML ~~LOC~~ SOPN: PEN_INJECTOR | SUBCUTANEOUS | 2 refills | Status: DC

## 2017-03-22 NOTE — Progress Notes (Signed)
victo

## 2017-03-30 ENCOUNTER — Encounter: Payer: Self-pay | Admitting: Internal Medicine

## 2017-04-01 NOTE — Telephone Encounter (Signed)
Copied from [place]. Topic: General - Other >> Apr 01, 2017 11:48 AM Neva Seat wrote: Victoza  Pt has been waiting for 2 weeks since her visit w/ Dr. Derrel Nip.  [facility] out pt pharmacy has faxed over a prior authorization on Jan. 22 Pt called to ask why and needing her Rx asap.

## 2017-04-03 ENCOUNTER — Telehealth: Payer: Self-pay | Admitting: Internal Medicine

## 2017-04-03 NOTE — Telephone Encounter (Signed)
Please start PA for victoza.

## 2017-04-04 ENCOUNTER — Other Ambulatory Visit: Payer: Self-pay | Admitting: Internal Medicine

## 2017-04-04 DIAGNOSIS — F419 Anxiety disorder, unspecified: Secondary | ICD-10-CM

## 2017-04-04 MED ORDER — ALPRAZOLAM 0.25 MG PO TABS: ORAL_TABLET | ORAL | 3 refills | Status: DC

## 2017-04-05 NOTE — Telephone Encounter (Signed)
PA has been submitted on covermymeds.  

## 2017-04-08 ENCOUNTER — Encounter: Payer: Self-pay | Admitting: Internal Medicine

## 2017-04-10 MED ORDER — DULAGLUTIDE 0.75 MG/0.5ML ~~LOC~~ SOAJ: 0.5000 mL | SUBCUTANEOUS | 2 refills | Status: DC

## 2017-05-04 ENCOUNTER — Other Ambulatory Visit: Payer: Self-pay | Admitting: Internal Medicine

## 2017-05-09 ENCOUNTER — Ambulatory Visit: Payer: Self-pay | Admitting: Family Medicine

## 2017-05-09 VITALS — BP 106/58 | HR 92 | Temp 99.1°F | Wt 203.0 lb

## 2017-05-09 DIAGNOSIS — J111 Influenza due to unidentified influenza virus with other respiratory manifestations: Secondary | ICD-10-CM

## 2017-05-09 DIAGNOSIS — R509 Fever, unspecified: Secondary | ICD-10-CM

## 2017-05-09 DIAGNOSIS — R69 Illness, unspecified: Secondary | ICD-10-CM

## 2017-05-09 LAB — POCT INFLUENZA A/B
Influenza A, POC: NEGATIVE
Influenza B, POC: NEGATIVE

## 2017-05-09 MED ORDER — OSELTAMIVIR PHOSPHATE 75 MG PO CAPS
75.0000 mg | ORAL_CAPSULE | Freq: Two times a day (BID) | ORAL | 0 refills | Status: DC
Start: 2017-05-09 — End: 2017-10-28

## 2017-05-09 NOTE — Progress Notes (Signed)
C/o cough bodyache chills drainage fever x 1d  OTC: none

## 2017-05-09 NOTE — Patient Instructions (Signed)

## 2017-05-09 NOTE — Progress Notes (Signed)
Miranda Huang is a 57 y.o. female who reports 2 days of flu like symptoms and fever. Miranda Huang is a diabetic and on medication for hypertension.   Review of Systems  Constitutional: Positive for chills, fever and malaise/fatigue.  HENT: Negative for congestion, ear discharge, ear pain, nosebleeds, sinus pain and sore throat.   Eyes: Negative.   Respiratory: Positive for cough. Negative for hemoptysis, sputum production, shortness of breath and stridor.   Cardiovascular: Positive for chest pain.  Gastrointestinal: Positive for nausea. Negative for abdominal pain, diarrhea and vomiting.  Genitourinary: Negative for dysuria, frequency and urgency.  Musculoskeletal: Negative for back pain, joint pain, myalgias and neck pain.  Neurological: Negative for dizziness and headaches.  Psychiatric/Behavioral: Negative.     O: Vitals:   05/09/17 1116  BP: (!) 106/58  Pulse: 92  Temp: 99.1 F (37.3 C)  SpO2: 96%   Physical Exam  Constitutional: She is oriented to person, place, and time. Vital signs are normal. She appears well-developed and well-nourished. She is active. She does not appear ill.  HENT:  Head: Normocephalic.  Right Ear: Hearing, tympanic membrane, external ear and ear canal normal.  Left Ear: Hearing, tympanic membrane, external ear and ear canal normal.  Nose: Rhinorrhea present.  Mouth/Throat: Uvula is midline and oropharynx is clear and moist. No oropharyngeal exudate.  Eyes: Pupils are equal, round, and reactive to light.  Neck: Normal range of motion. Neck supple.  Cardiovascular: Normal rate, regular rhythm, normal heart sounds and intact distal pulses.  Pulmonary/Chest: Effort normal and breath sounds normal.  Abdominal: Soft. Bowel sounds are normal.  Musculoskeletal: Normal range of motion.  Lymphadenopathy:    She has cervical adenopathy.       Right cervical: Deep cervical adenopathy present. No superficial cervical adenopathy present.      Left  cervical: Superficial cervical and deep cervical adenopathy present.  Neurological: She is alert and oriented to person, place, and time.  Skin: Skin is warm and dry.  Nursing note and vitals reviewed.   A: 1. Fever, unspecified fever cause   2. Influenza-like illness    P: 1. Fever, unspecified fever cause - POCT Influenza A/B-NEGATIVE  2. Influenza-like illness - oseltamivir (TAMIFLU) 75 MG capsule; Take 1 capsule (75 mg total) by mouth 2 (two) times daily. Discussed decision to treat, and diagnosis etiology and treatment. -Work note for 48 hours -Discussed symptomatic care and treatment. Reviewed medication use and indications.

## 2017-05-12 ENCOUNTER — Other Ambulatory Visit: Payer: Self-pay | Admitting: Family Medicine

## 2017-05-12 ENCOUNTER — Telehealth: Payer: Self-pay | Admitting: Emergency Medicine

## 2017-05-12 DIAGNOSIS — R0981 Nasal congestion: Secondary | ICD-10-CM

## 2017-05-12 MED ORDER — IPRATROPIUM BROMIDE 0.03 % NA SOLN: 2.0000 | Freq: Two times a day (BID) | NASAL | 12 refills | Status: DC

## 2017-05-12 NOTE — Progress Notes (Signed)
Call to patient revealed resolution of most symptoms outside of nasal congestion. Will treat symptoms with atrovent nasal- patient notified of treatment plan supplement

## 2017-05-12 NOTE — Telephone Encounter (Signed)
Spoke with patient stated that she returned back to work today. stopped up but no fever and still taking tamiflu. Informed her to contact us if not any better

## 2017-05-17 ENCOUNTER — Other Ambulatory Visit: Payer: Self-pay | Admitting: Internal Medicine

## 2017-05-17 DIAGNOSIS — K219 Gastro-esophageal reflux disease without esophagitis: Secondary | ICD-10-CM

## 2017-05-20 ENCOUNTER — Other Ambulatory Visit (INDEPENDENT_AMBULATORY_CARE_PROVIDER_SITE_OTHER): Payer: 59

## 2017-05-20 ENCOUNTER — Other Ambulatory Visit: Payer: Self-pay | Admitting: Internal Medicine

## 2017-05-20 ENCOUNTER — Encounter: Payer: Self-pay | Admitting: Internal Medicine

## 2017-05-20 DIAGNOSIS — R197 Diarrhea, unspecified: Secondary | ICD-10-CM

## 2017-05-20 LAB — COMPREHENSIVE METABOLIC PANEL
ALK PHOS: 105 U/L (ref 39–117)
ALT: 27 U/L (ref 0–35)
AST: 14 U/L (ref 0–37)
Albumin: 4.8 g/dL (ref 3.5–5.2)
BILIRUBIN TOTAL: 0.6 mg/dL (ref 0.2–1.2)
BUN: 15 mg/dL (ref 6–23)
CO2: 23 mEq/L (ref 19–32)
Calcium: 9.8 mg/dL (ref 8.4–10.5)
Chloride: 103 mEq/L (ref 96–112)
Creatinine, Ser: 0.68 mg/dL (ref 0.40–1.20)
GFR: 94.88 mL/min (ref >60.00)
GLUCOSE: 141 mg/dL — AB (ref 70–99)
Potassium: 4.3 mEq/L (ref 3.5–5.1)
Sodium: 137 mEq/L (ref 135–145)
TOTAL PROTEIN: 7.7 g/dL (ref 6.0–8.3)

## 2017-05-20 LAB — SEDIMENTATION RATE: SED RATE: 10 mm/h (ref 0–30)

## 2017-05-20 LAB — CBC WITH DIFFERENTIAL/PLATELET
Basophils Absolute: 0 10*3/uL (ref 0.0–0.1)
Basophils Relative: 0.3 % (ref 0.0–3.0)
EOS PCT: 2.3 % (ref 0.0–5.0)
Eosinophils Absolute: 0.2 10*3/uL (ref 0.0–0.7)
HCT: 45.4 % (ref 36.0–46.0)
HEMOGLOBIN: 15.3 g/dL — AB (ref 12.0–15.0)
LYMPHS PCT: 9.5 % — AB (ref 12.0–46.0)
Lymphs Abs: 0.9 10*3/uL (ref 0.7–4.0)
MCHC: 33.6 g/dL (ref 30.0–36.0)
MCV: 81.7 fl (ref 78.0–100.0)
MONOS PCT: 6.9 % (ref 3.0–12.0)
Monocytes Absolute: 0.7 10*3/uL (ref 0.1–1.0)
Neutro Abs: 8.1 10*3/uL — ABNORMAL HIGH (ref 1.4–7.7)
Neutrophils Relative %: 81 % — ABNORMAL HIGH (ref 43.0–77.0)
Platelets: 360 10*3/uL (ref 150.0–400.0)
RBC: 5.56 Mil/uL — AB (ref 3.87–5.11)
RDW: 16.8 % — ABNORMAL HIGH (ref 11.5–15.5)
WBC: 10 10*3/uL (ref 4.0–10.5)

## 2017-05-20 LAB — MAGNESIUM: MAGNESIUM: 1.7 mg/dL (ref 1.5–2.5)

## 2017-05-20 LAB — LIPASE: LIPASE: 10 U/L — AB (ref 11.0–59.0)

## 2017-05-20 NOTE — Addendum Note (Signed)
Addended by: Leeanne Rio on: 05/20/2017 09:58 AM   Modules accepted: Orders

## 2017-05-20 NOTE — Progress Notes (Signed)
p 

## 2017-05-24 LAB — GASTROINTESTINAL PATHOGEN PANEL PCR
C. DIFFICILE TOX A/B, PCR: NOT DETECTED
CRYPTOSPORIDIUM, PCR: NOT DETECTED
Campylobacter, PCR: NOT DETECTED
E COLI (ETEC) LT/ST, PCR: NOT DETECTED
E COLI (STEC) STX1/STX2, PCR: NOT DETECTED
E COLI 0157, PCR: NOT DETECTED
Giardia lamblia, PCR: NOT DETECTED
Norovirus, PCR: NOT DETECTED
Rotavirus A, PCR: NOT DETECTED
Salmonella, PCR: NOT DETECTED
Shigella, PCR: NOT DETECTED

## 2017-05-24 LAB — CLOSTRIDIUM DIFFICILE TOXIN B, QUALITATIVE, REAL-TIME PCR: Toxigenic C. Difficile by PCR: NOT DETECTED

## 2017-07-05 ENCOUNTER — Other Ambulatory Visit: Payer: Self-pay | Admitting: Internal Medicine

## 2017-07-05 ENCOUNTER — Encounter: Payer: Self-pay | Admitting: Internal Medicine

## 2017-07-05 DIAGNOSIS — Z794 Long term (current) use of insulin: Secondary | ICD-10-CM

## 2017-07-05 DIAGNOSIS — E1100 Type 2 diabetes mellitus with hyperosmolarity without nonketotic hyperglycemic-hyperosmolar coma (NKHHC): Secondary | ICD-10-CM

## 2017-07-05 DIAGNOSIS — E119 Type 2 diabetes mellitus without complications: Secondary | ICD-10-CM

## 2017-07-22 ENCOUNTER — Other Ambulatory Visit (INDEPENDENT_AMBULATORY_CARE_PROVIDER_SITE_OTHER): Payer: 59

## 2017-07-22 DIAGNOSIS — E1100 Type 2 diabetes mellitus with hyperosmolarity without nonketotic hyperglycemic-hyperosmolar coma (NKHHC): Secondary | ICD-10-CM | POA: Diagnosis not present

## 2017-07-22 DIAGNOSIS — Z794 Long term (current) use of insulin: Secondary | ICD-10-CM | POA: Diagnosis not present

## 2017-07-22 LAB — COMPREHENSIVE METABOLIC PANEL
ALT: 22 U/L (ref 0–35)
AST: 15 U/L (ref 0–37)
Albumin: 4.2 g/dL (ref 3.5–5.2)
Alkaline Phosphatase: 85 U/L (ref 39–117)
BILIRUBIN TOTAL: 0.4 mg/dL (ref 0.2–1.2)
BUN: 17 mg/dL (ref 6–23)
CO2: 27 meq/L (ref 19–32)
CREATININE: 0.59 mg/dL (ref 0.40–1.20)
Calcium: 9.2 mg/dL (ref 8.4–10.5)
Chloride: 105 mEq/L (ref 96–112)
GFR: 111.71 mL/min (ref >60.00)
GLUCOSE: 131 mg/dL — AB (ref 70–99)
Potassium: 4.3 mEq/L (ref 3.5–5.1)
Sodium: 139 mEq/L (ref 135–145)
Total Protein: 6.8 g/dL (ref 6.0–8.3)

## 2017-07-22 LAB — LIPID PANEL
CHOL/HDL RATIO: 4
CHOLESTEROL: 191 mg/dL (ref 0–200)
HDL: 48.7 mg/dL (ref >39.00)
LDL CALC: 115 mg/dL — AB (ref 0–99)
NonHDL: 142.22
TRIGLYCERIDES: 134 mg/dL (ref 0.0–149.0)
VLDL: 26.8 mg/dL (ref 0.0–40.0)

## 2017-07-22 LAB — HEMOGLOBIN A1C: Hgb A1c MFr Bld: 6.4 % (ref 4.6–6.5)

## 2017-09-27 ENCOUNTER — Other Ambulatory Visit: Payer: Self-pay | Admitting: Internal Medicine

## 2017-10-19 ENCOUNTER — Other Ambulatory Visit: Payer: Self-pay | Admitting: Internal Medicine

## 2017-10-19 DIAGNOSIS — F419 Anxiety disorder, unspecified: Secondary | ICD-10-CM

## 2017-10-19 DIAGNOSIS — E119 Type 2 diabetes mellitus without complications: Secondary | ICD-10-CM

## 2017-10-20 NOTE — Telephone Encounter (Signed)
Please notify patient that the alprazolam was refilled for 30 days only becausecontrolled substances and require a  6 month follow up.  She is also overdue  for diabetes follow up  Fasting labs ordered  Please get before visit.

## 2017-10-20 NOTE — Telephone Encounter (Signed)
Spoke with pt and she is scheduled for both a fasting lab appt and an appt with Dr. Derrel Nip. Pt is aware of appt date and time.

## 2017-10-20 NOTE — Telephone Encounter (Signed)
Refilled: 04/04/2017 Last OV: 03/18/2017 Next OV: 03/20/2018

## 2017-10-25 ENCOUNTER — Other Ambulatory Visit (INDEPENDENT_AMBULATORY_CARE_PROVIDER_SITE_OTHER): Payer: 59

## 2017-10-25 DIAGNOSIS — E119 Type 2 diabetes mellitus without complications: Secondary | ICD-10-CM | POA: Diagnosis not present

## 2017-10-25 LAB — COMPREHENSIVE METABOLIC PANEL
ALK PHOS: 82 U/L (ref 39–117)
ALT: 20 U/L (ref 0–35)
AST: 14 U/L (ref 0–37)
Albumin: 4.3 g/dL (ref 3.5–5.2)
BUN: 20 mg/dL (ref 6–23)
CALCIUM: 9.2 mg/dL (ref 8.4–10.5)
CHLORIDE: 105 meq/L (ref 96–112)
CO2: 28 meq/L (ref 19–32)
CREATININE: 0.64 mg/dL (ref 0.40–1.20)
GFR: 101.6 mL/min (ref >60.00)
GLUCOSE: 134 mg/dL — AB (ref 70–99)
Potassium: 4.7 mEq/L (ref 3.5–5.1)
SODIUM: 138 meq/L (ref 135–145)
Total Bilirubin: 0.4 mg/dL (ref 0.2–1.2)
Total Protein: 7 g/dL (ref 6.0–8.3)

## 2017-10-25 LAB — HEMOGLOBIN A1C: Hgb A1c MFr Bld: 6.2 % (ref 4.6–6.5)

## 2017-10-25 LAB — LIPID PANEL
CHOLESTEROL: 191 mg/dL (ref 0–200)
HDL: 51.6 mg/dL (ref >39.00)
LDL Cholesterol: 117 mg/dL — ABNORMAL HIGH (ref 0–99)
NonHDL: 138.96
Total CHOL/HDL Ratio: 4
Triglycerides: 109 mg/dL (ref 0.0–149.0)
VLDL: 21.8 mg/dL (ref 0.0–40.0)

## 2017-10-28 ENCOUNTER — Encounter: Payer: Self-pay | Admitting: Internal Medicine

## 2017-10-28 ENCOUNTER — Ambulatory Visit (INDEPENDENT_AMBULATORY_CARE_PROVIDER_SITE_OTHER): Payer: 59 | Admitting: Internal Medicine

## 2017-10-28 DIAGNOSIS — E119 Type 2 diabetes mellitus without complications: Secondary | ICD-10-CM | POA: Diagnosis not present

## 2017-10-28 DIAGNOSIS — K219 Gastro-esophageal reflux disease without esophagitis: Secondary | ICD-10-CM | POA: Diagnosis not present

## 2017-10-28 DIAGNOSIS — E7800 Pure hypercholesterolemia, unspecified: Secondary | ICD-10-CM | POA: Diagnosis not present

## 2017-10-28 DIAGNOSIS — E66811 Obesity, class 1: Secondary | ICD-10-CM

## 2017-10-28 DIAGNOSIS — I10 Essential (primary) hypertension: Secondary | ICD-10-CM

## 2017-10-28 DIAGNOSIS — E669 Obesity, unspecified: Secondary | ICD-10-CM | POA: Diagnosis not present

## 2017-10-28 MED ORDER — ESOMEPRAZOLE MAGNESIUM 40 MG PO CPDR: DELAYED_RELEASE_CAPSULE | ORAL | 1 refills | Status: DC

## 2017-10-28 MED ORDER — HYDROCHLOROTHIAZIDE 25 MG PO TABS: 25.0000 mg | ORAL_TABLET | Freq: Every day | ORAL | 0 refills | Status: DC

## 2017-10-28 NOTE — Patient Instructions (Addendum)
CONGRATULATIONS!   Keep it up  Your goal is 180 lbs   START EXERCISING FOR 30 MINUTES DAILY AT [place]!!!   To make a low carb chip :  Take the Joseph[employer] Lavash or Pita bread,  Or the [place] Low carb whole wheat tortilla   Place on metal cookie sheet  Brush with olive oil  Sprinkle garlic powder (NOT garlic salt), grated parmesan cheese, mediterranean seasoning , or all of them?  Bake at 275 for 30 minutes   We have substitutions for your potatoes!!  Try the mashed cauliflower and riced cauliflower dishes instead of rice and mashed potatoes  Mashed turnips are also very low carb!   For desserts :  Try the Dannon Lt [employer] Fit greek yogurt dessert flavors and top with reddi Whip .  8 carbs,  80 calories  Try Oikos Triple Zero [place] Yogurt in the salted caramel, and the coffee flavors  With Whipped Cream for dessert  breyer's low carb ice cream, available in bars (on a stick, better ) or scoopable ice cream  HERE ARE THE LOW CARB  BREAD CHOICES   New one:  Sola !  Kristopher Oppenheim  3 g Richardson Dopp DELICIOUS !

## 2017-10-28 NOTE — Progress Notes (Signed)
Subjective:  Patient ID: Miranda Huang, female    DOB: [birth year]  Age: 57 y.o. MRN: [id number]  CC: Diagnoses of Gastroesophageal reflux disease, esophagitis presence not specified, Type 2 diabetes mellitus without complication, without long-term current use of insulin ([place]), Essential hypertension, Hypercholesteremia, and Obesity (BMI 30.0-34.9) were pertinent to this visit.  HPI Miranda Huang presents for 6 month follow up on diabetes.  Patient has no complaints today.  Patient is following a low glycemic index diet and taking all prescribed medications regularly without side effects.  Fasting sugars have been under less than 140 most of the time and post prandials have been under 160 except on rare occasions. Patient is not  exercising  Or  intentionally trying to lose weight, but she has lost weight since starting Trulicity  .  Patient has had an eye exam in the last 12 months and checks feet regularly for signs of infection.  Patient does not walk barefoot outside,  And denies any numbness tingling or burning in feet. Patient is up to date on all recommended vaccinations.    Outpatient Medications Prior to Visit  Medication Sig Dispense Refill  . albuterol (PROVENTIL HFA;VENTOLIN HFA) 108 (90 Base) MCG/ACT inhaler Inhale 2 puffs into the lungs every 6 (six) hours as needed for wheezing or shortness of breath. 1 Inhaler 0  . ALPRAZolam (XANAX) 0.25 MG tablet TAKE ONE TABLET BY MOUTH AT BEDTIME AS NEEDED FOR ANXIETY 30 tablet 0  . cetirizine (ZYRTEC) 5 MG tablet Take 5 mg by mouth daily.    Marland Kitchen escitalopram (LEXAPRO) 10 MG tablet TAKE 1 TO 2 TABLETS BY MOUTH DAILY 180 tablet 1  . fluticasone (FLONASE) 50 MCG/ACT nasal spray Place 2 sprays into both nostrils daily. 16 g 6  . gabapentin (NEURONTIN) 100 MG capsule TAKE 1 CAPSULE BY MOUTH AT BEDTIME (Patient taking differently: TAKE 1 CAPSULE BY MOUTH AT BEDTIME prn) 15 capsule 0  . glucose blood test strip BAYER CONTOUR TEST (In Vitro Strip)  1  (one) Strip two times daily, and as needed for 0 days  Quantity: 100;  Refills: 12   Ordered :11-Jun-2014  Althea Charon ;  Started 11-Jun-2014 Active Comments: Medication taken as needed. 100 each 12  . lisinopril (PRINIVIL,ZESTRIL) 5 MG tablet Take 1 tablet (5 mg total) by mouth daily. 90 tablet 3  . sucralfate (CARAFATE) 1 g tablet Take 1 tablet (1 g total) by mouth 4 (four) times daily -  with meals and at bedtime. 120 tablet 1  . TRULICITY 1.49 FW/2.6VZ SOPN INJECT 0.5 MILLILITER INTO THE SKIN ONCE A WEEK 2 mL 2  . esomeprazole (NEXIUM) 40 MG capsule TAKE 1 CAPSULE BY MOUTH DAILY AT 12 NOON. 90 capsule 1  . hydrochlorothiazide (HYDRODIURIL) 25 MG tablet TAKE 1 TABLET BY MOUTH ONCE DAILY 90 tablet 4  . benzonatate (TESSALON) 200 MG capsule Take 1 capsule (200 mg total) by mouth 3 (three) times daily as needed for cough. 60 capsule 1  . Insulin Pen Needle (PEN NEEDLES) 31G X 6 MM MISC For use with victoza /saxenda 100 each 0  . ipratropium (ATROVENT) 0.03 % nasal spray Place 2 sprays into both nostrils every 12 (twelve) hours. 30 mL 12  . liraglutide (VICTOZA) 18 MG/3ML SOPN 0.6 mg  SubQ  once daily for 1 week;  increase to 1.2 mg once daily;  may increase  to 1.8 mg once daily (Patient not taking: Reported on 05/09/2017) 9 mL 2  . metFORMIN (GLUCOPHAGE-XR) 500 MG  24 hr tablet TAKE 2 TABLETS BY MOUTH DAILY WITH BREAKFAST 180 tablet 1  . nystatin cream (MYCOSTATIN) Apply 1 application topically 2 (two) times daily. (Patient not taking: Reported on 05/09/2017) 30 g 0  . oseltamivir (TAMIFLU) 75 MG capsule Take 1 capsule (75 mg total) by mouth 2 (two) times daily. 10 capsule 0   No facility-administered medications prior to visit.     Review of Systems;  Patient denies headache, fevers, malaise, unintentional weight loss, skin rash, eye pain, sinus congestion and sinus pain, sore throat, dysphagia,  hemoptysis , cough, dyspnea, wheezing, chest pain, palpitations, orthopnea, edema, abdominal pain,  nausea, melena, diarrhea, constipation, flank pain, dysuria, hematuria, urinary  Frequency, nocturia, numbness, tingling, seizures,  Focal weakness, Loss of consciousness,  Tremor, insomnia, depression, anxiety, and suicidal ideation.      Objective:  BP 118/70 (BP Location: Left Arm, Patient Position: Sitting, Cuff Size: Normal)   Pulse 78   Temp 98.3 F (36.8 C) (Oral)   Resp 15   Ht 5\' 5"  (1.651 m)   Wt 195 lb 11.2 oz (88.8 kg)   SpO2 97%   BMI 32.57 kg/m   BP Readings from Last 3 Encounters:  10/28/17 118/70  05/09/17 (!) 106/58  03/18/17 120/72    Wt Readings from Last 3 Encounters:  10/28/17 195 lb 11.2 oz (88.8 kg)  05/09/17 203 lb (92.1 kg)  03/18/17 211 lb 6.4 oz (95.9 kg)    General appearance: alert, cooperative and appears stated age Ears: normal TM's and external ear canals both ears Throat: lips, mucosa, and tongue normal; teeth and gums normal Neck: no adenopathy, no carotid bruit, supple, symmetrical, trachea midline and thyroid not enlarged, symmetric, no tenderness/mass/nodules Back: symmetric, no curvature. ROM normal. No CVA tenderness. Lungs: clear to auscultation bilaterally Heart: regular rate and rhythm, S1, S2 normal, no murmur, click, rub or gallop Abdomen: soft, non-tender; bowel sounds normal; no masses,  no organomegaly Pulses: 2+ and symmetric Skin: Skin color, texture, turgor normal. No rashes or lesions Lymph nodes: Cervical, supraclavicular, and axillary nodes normal.  Lab Results  Component Value Date   HGBA1C 6.2 10/25/2017   HGBA1C 6.4 07/22/2017   HGBA1C 7.5 (H) 03/18/2017    Lab Results  Component Value Date   CREATININE 0.64 10/25/2017   CREATININE 0.59 07/22/2017   CREATININE 0.68 05/20/2017    Lab Results  Component Value Date   WBC 10.0 05/20/2017   HGB 15.3 (H) 05/20/2017   HCT 45.4 05/20/2017   PLT 360.0 05/20/2017   GLUCOSE 134 (H) 10/25/2017   CHOL 191 10/25/2017   TRIG 109.0 10/25/2017   HDL 51.60 10/25/2017    LDLCALC 117 (H) 10/25/2017   ALT 20 10/25/2017   AST 14 10/25/2017   NA 138 10/25/2017   K 4.7 10/25/2017   CL 105 10/25/2017   CREATININE 0.64 10/25/2017   BUN 20 10/25/2017   CO2 28 10/25/2017   TSH 1.81 05/05/2016   HGBA1C 6.2 10/25/2017   MICROALBUR <0.7 03/18/2017    No results found.  Assessment & Plan:   Problem List Items Addressed This Visit    Acid reflux   Relevant Medications   esomeprazole (NEXIUM) 40 MG capsule   Diabetes mellitus, type 2 ([place])    Currently well-controlled since starting Trulicity  .  hemoglobin A1c is at goal of less than 7.0 . Patient is reminded to schedule an annual eye exam and foot exam is normal today. Patient has no microalbuminuria. Patient is tolerating statin therapy  for CAD risk reduction and on ACE/ARB for renal protection and hypertension .  Lab Results  Component Value Date   HGBA1C 6.2 10/25/2017    Lab Results  Component Value Date   MICROALBUR <0.7 03/18/2017          Essential hypertension    Well controlled on current regimen. Renal function stable, no changes today.   Lab Results  Component Value Date   CREATININE 0.64 10/25/2017   Lab Results  Component Value Date   NA 138 10/25/2017   K 4.7 10/25/2017   CL 105 10/25/2017   CO2 28 10/25/2017         Relevant Medications   hydrochlorothiazide (HYDRODIURIL) 25 MG tablet   Hypercholesteremia    10 yr risk is 14% using the FRC.  Stain therapy disscussed,  wil start for 10 yr risk of 20% or higher Lab Results  Component Value Date   CHOL 191 10/25/2017   HDL 51.60 10/25/2017   LDLCALC 117 (H) 10/25/2017   TRIG 109.0 10/25/2017   CHOLHDL 4 10/25/2017         Relevant Medications   hydrochlorothiazide (HYDRODIURIL) 25 MG tablet   Obesity (BMI 30.0-34.9)    I have addressed  BMI and recommended wt loss of 10% of body weigh over the next 6 months using a low glycemic index diet and regular exercise a minimum of 5 days per week.          A  total of 25 minutes of face to face time was spent with patient more than half of which was spent in counselling about the above mentioned conditions  and coordination of care  I have discontinued Breeanne G. Devonshire's benzonatate, nystatin cream, liraglutide, Pen Needles, oseltamivir, ipratropium, and metFORMIN. I have also changed her hydrochlorothiazide. Additionally, I am having her maintain her gabapentin, sucralfate, cetirizine, glucose blood, albuterol, fluticasone, lisinopril, escitalopram, TRULICITY, ALPRAZolam, and esomeprazole.  Meds ordered this encounter  Medications  . hydrochlorothiazide (HYDRODIURIL) 25 MG tablet    Sig: Take 1 tablet (25 mg total) by mouth daily.    Dispense:  90 tablet    Refill:  0  . esomeprazole (NEXIUM) 40 MG capsule    Sig: TAKE 1 CAPSULE BY MOUTH DAILY AT 12 NOON.    Dispense:  90 capsule    Refill:  1    Medications Discontinued During This Encounter  Medication Reason  . benzonatate (TESSALON) 200 MG capsule Patient Preference  . nystatin cream (MYCOSTATIN) Patient Preference  . liraglutide (VICTOZA) 18 MG/3ML SOPN Patient Preference  . Insulin Pen Needle (PEN NEEDLES) 31G X 6 MM MISC Patient Preference  . oseltamivir (TAMIFLU) 75 MG capsule Patient Preference  . ipratropium (ATROVENT) 0.03 % nasal spray Patient Preference  . metFORMIN (GLUCOPHAGE-XR) 500 MG 24 hr tablet Patient Preference  . hydrochlorothiazide (HYDRODIURIL) 25 MG tablet Reorder  . esomeprazole (NEXIUM) 40 MG capsule Reorder    Follow-up: No follow-ups on file.   Crecencio Mc, MD

## 2017-10-30 NOTE — Assessment & Plan Note (Signed)
Well controlled on current regimen. Renal function stable, no changes today.   Lab Results  Component Value Date   CREATININE 0.64 10/25/2017   Lab Results  Component Value Date   NA 138 10/25/2017   K 4.7 10/25/2017   CL 105 10/25/2017   CO2 28 10/25/2017

## 2017-10-30 NOTE — Assessment & Plan Note (Addendum)
10 yr risk is 14% using the FRC.  Stain therapy disscussed,  wil start for 10 yr risk of 20% or higher Lab Results  Component Value Date   CHOL 191 10/25/2017   HDL 51.60 10/25/2017   LDLCALC 117 (H) 10/25/2017   TRIG 109.0 10/25/2017   CHOLHDL 4 10/25/2017

## 2017-10-30 NOTE — Assessment & Plan Note (Signed)
I have addressed  BMI and recommended wt loss of 10% of body weigh over the next 6 months using a low glycemic index diet and regular exercise a minimum of 5 days per week.   

## 2017-10-30 NOTE — Assessment & Plan Note (Signed)
Currently well-controlled since starting Trulicity  .  hemoglobin A1c is at goal of less than 7.0 . Patient is reminded to schedule an annual eye exam and foot exam is normal today. Patient has no microalbuminuria. Patient is tolerating statin therapy for CAD risk reduction and on ACE/ARB for renal protection and hypertension .  Lab Results  Component Value Date   HGBA1C 6.2 10/25/2017    Lab Results  Component Value Date   MICROALBUR <0.7 03/18/2017

## 2017-11-22 DIAGNOSIS — H524 Presbyopia: Secondary | ICD-10-CM | POA: Diagnosis not present

## 2017-11-22 DIAGNOSIS — E119 Type 2 diabetes mellitus without complications: Secondary | ICD-10-CM | POA: Diagnosis not present

## 2017-11-22 DIAGNOSIS — H52223 Regular astigmatism, bilateral: Secondary | ICD-10-CM | POA: Diagnosis not present

## 2017-12-14 ENCOUNTER — Other Ambulatory Visit: Payer: Self-pay | Admitting: Internal Medicine

## 2017-12-14 DIAGNOSIS — Z1231 Encounter for screening mammogram for malignant neoplasm of breast: Secondary | ICD-10-CM

## 2017-12-21 ENCOUNTER — Other Ambulatory Visit: Payer: Self-pay | Admitting: Internal Medicine

## 2018-01-05 ENCOUNTER — Ambulatory Visit
Admission: RE | Admit: 2018-01-05 | Discharge: 2018-01-05 | Disposition: A | Discharge status: Home / Self Care | Payer: 59 | Source: Ambulatory Visit | Attending: Internal Medicine | Admitting: Internal Medicine

## 2018-01-05 DIAGNOSIS — Z1231 Encounter for screening mammogram for malignant neoplasm of breast: Secondary | ICD-10-CM | POA: Insufficient documentation

## 2018-01-17 LAB — HM DIABETES EYE EXAM

## 2018-01-20 ENCOUNTER — Other Ambulatory Visit: Payer: Self-pay | Admitting: Internal Medicine

## 2018-01-20 DIAGNOSIS — M71341 Other bursal cyst, right hand: Secondary | ICD-10-CM | POA: Diagnosis not present

## 2018-01-20 DIAGNOSIS — D2261 Melanocytic nevi of right upper limb, including shoulder: Secondary | ICD-10-CM | POA: Diagnosis not present

## 2018-01-20 DIAGNOSIS — L304 Erythema intertrigo: Secondary | ICD-10-CM | POA: Diagnosis not present

## 2018-01-20 DIAGNOSIS — D2262 Melanocytic nevi of left upper limb, including shoulder: Secondary | ICD-10-CM | POA: Diagnosis not present

## 2018-01-20 DIAGNOSIS — D225 Melanocytic nevi of trunk: Secondary | ICD-10-CM | POA: Diagnosis not present

## 2018-01-20 DIAGNOSIS — E119 Type 2 diabetes mellitus without complications: Secondary | ICD-10-CM

## 2018-01-20 DIAGNOSIS — D2271 Melanocytic nevi of right lower limb, including hip: Secondary | ICD-10-CM | POA: Diagnosis not present

## 2018-01-20 DIAGNOSIS — D2272 Melanocytic nevi of left lower limb, including hip: Secondary | ICD-10-CM | POA: Diagnosis not present

## 2018-01-20 DIAGNOSIS — L700 Acne vulgaris: Secondary | ICD-10-CM | POA: Diagnosis not present

## 2018-01-24 MED ORDER — DULAGLUTIDE 0.75 MG/0.5ML ~~LOC~~ SOAJ: SUBCUTANEOUS | 11 refills | Status: DC

## 2018-01-24 MED ORDER — METFORMIN HCL ER 500 MG PO TB24: ORAL_TABLET | ORAL | 1 refills | Status: DC

## 2018-02-28 DIAGNOSIS — E119 Type 2 diabetes mellitus without complications: Secondary | ICD-10-CM | POA: Diagnosis not present

## 2018-02-28 DIAGNOSIS — H8112 Benign paroxysmal vertigo, left ear: Secondary | ICD-10-CM | POA: Diagnosis not present

## 2018-02-28 DIAGNOSIS — H8110 Benign paroxysmal vertigo, unspecified ear: Secondary | ICD-10-CM | POA: Diagnosis not present

## 2018-02-28 DIAGNOSIS — R42 Dizziness and giddiness: Secondary | ICD-10-CM | POA: Diagnosis not present

## 2018-02-28 DIAGNOSIS — I10 Essential (primary) hypertension: Secondary | ICD-10-CM | POA: Diagnosis not present

## 2018-03-08 IMAGING — MR MR MRCP
11 of 12 series · 39 of 48 positions shown · non-contrast
Comparison: None.

CLINICAL DATA: Right upper quadrant abdominal pain.

EXAM:
MRI ABDOMEN WITHOUT CONTRAST  (INCLUDING MRCP)
TECHNIQUE: Multiplanar multisequence MR imaging of the abdomen was performed.
Heavily T2-weighted images of the biliary and pancreatic ducts were
obtained, and three-dimensional MRCP images were rendered by post
processing.

[Series 2: bSSFP · coronal · 6.0mm · 0.74mm/px · 2 of 31 slices shown]
[im 1/31]
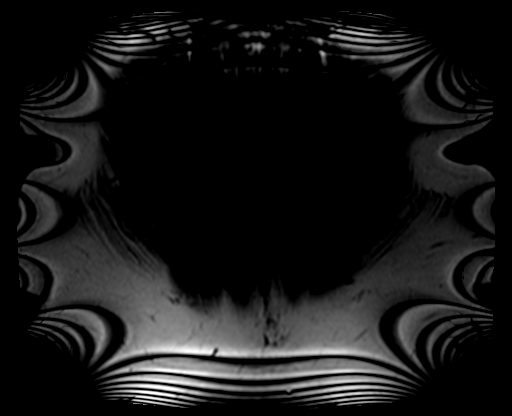
[im 31/31]
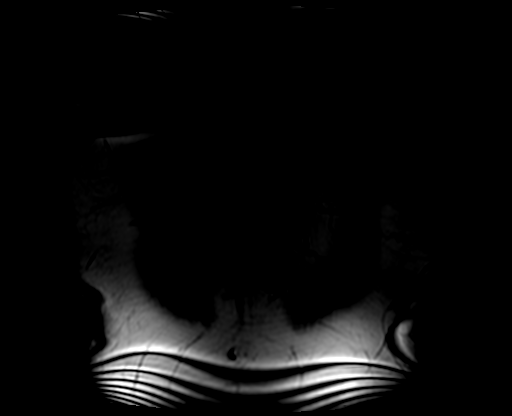

[Series 4: axial ssfse / · axial · 6.1mm · 1.19mm/px · z∈[-62,+179]mm · 2 of 34 slices shown]
[im 1/34]
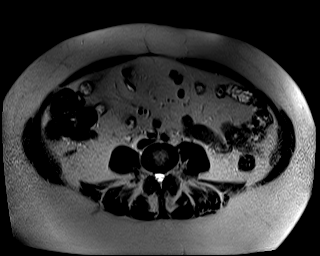
[im 34/34]
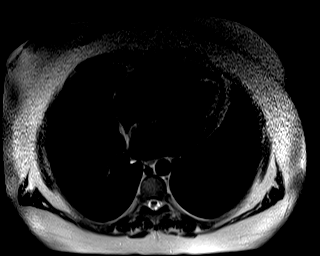

[Series 5: T1 · axial · 6.5mm · 0.78mm/px · z∈[-70,+187]mm · 6 of 68 slices shown]
[im 1/68]
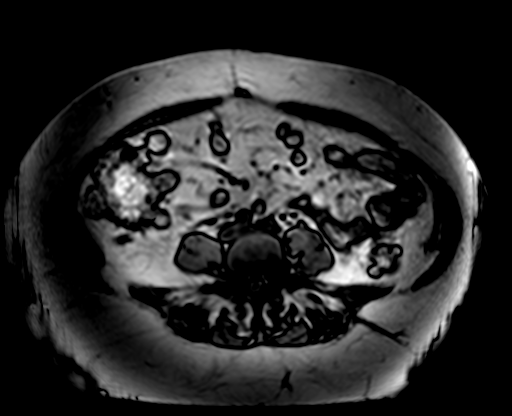
[im 14/68]
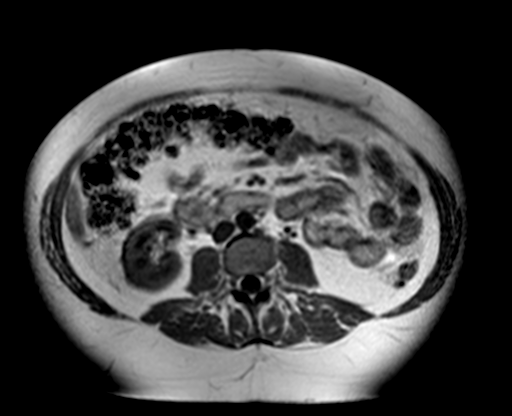
[im 27/68]
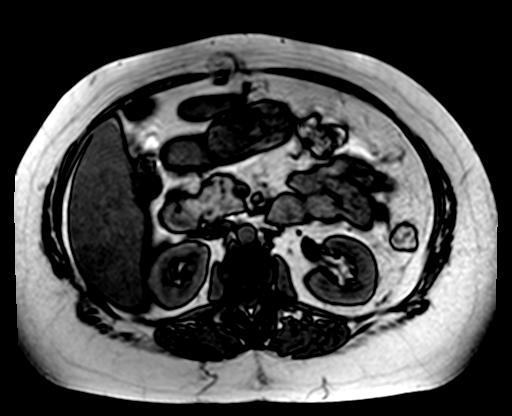
[im 41/68]
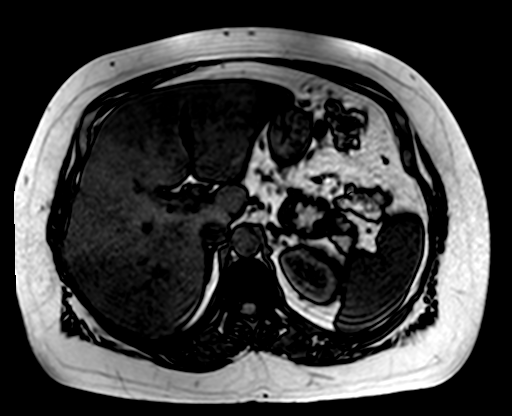
[im 54/68]
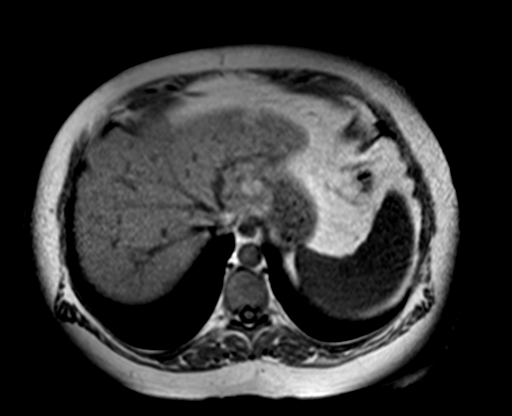
[im 68/68]
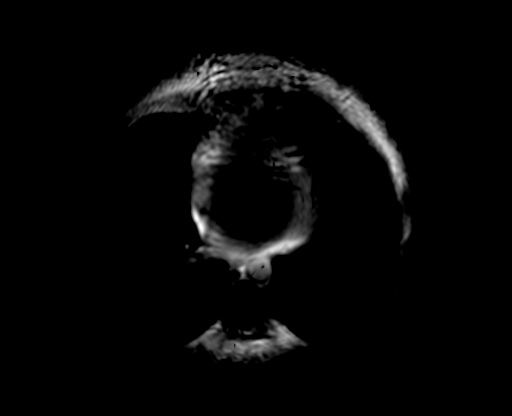

[Series 7: axial dynamic pre · axial · non-contrast · 4.0mm · 1.19mm/px · z∈[-50,+186]mm · 5 of 60 slices shown]
[im 1/60]
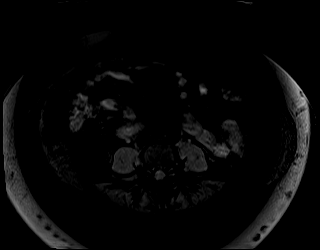
[im 15/60]
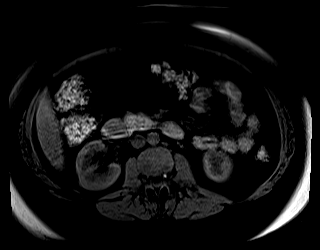
[im 30/60]
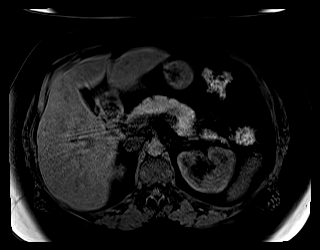
[im 45/60]
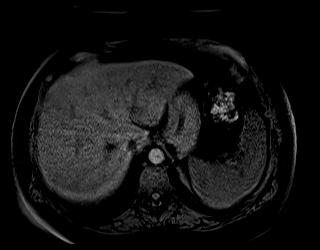
[im 60/60]
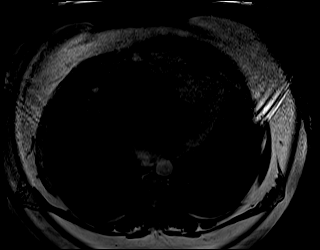

[Series 8: MRCP · coronal · 50.0mm · 0.78mm/px · 1 of 3 slices shown]
[im 1/3]
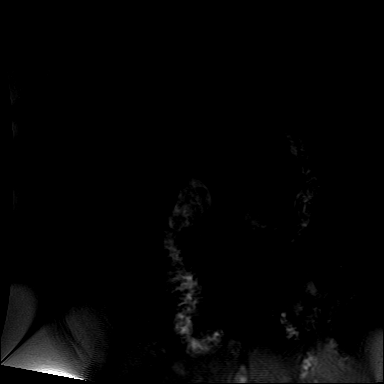

[Series 9: DWI · axial · 6.5mm · 2.00mm/px · z∈[-70,+187]mm · 8 of 102 slices shown]
[im 1/102]
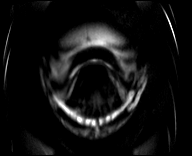
[im 15/102]
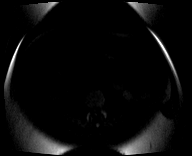
[im 29/102]
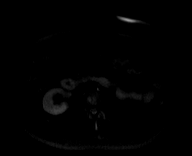
[im 44/102]
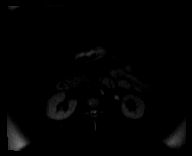
[im 58/102]
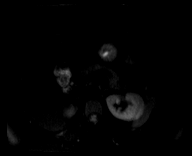
[im 73/102]
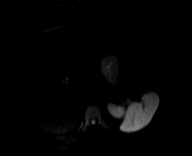
[im 87/102]
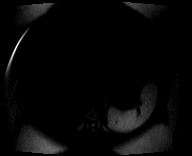
[im 102/102]
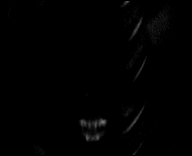

[Series 10: ax dwi_adc · axial · 6.5mm · 2.00mm/px · z∈[-70,+187]mm · 3 of 34 slices shown]
[im 1/34]
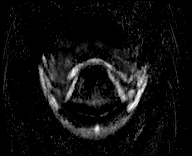
[im 17/34]
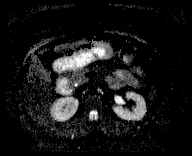
[im 34/34]
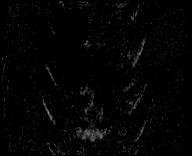

[Series 12: t2_tse_fs_tra_p2_trig_384 · axial · 6.5mm · 0.99mm/px · z∈[-70,+187]mm · 3 of 34 slices shown]
[im 1/34]
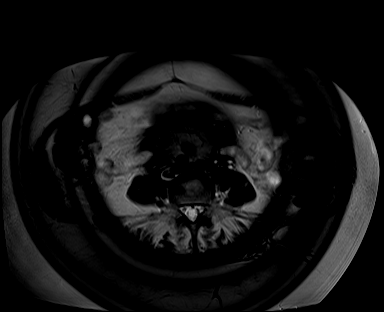
[im 17/34]
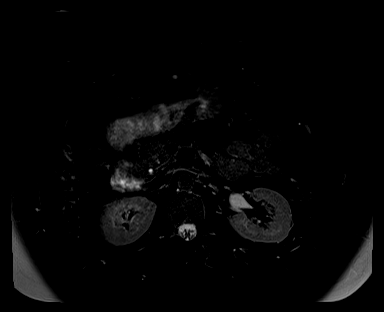
[im 34/34]
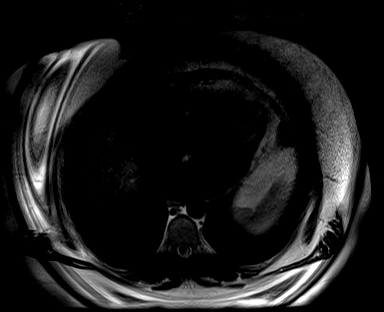

[Series 14: T2 · coronal · 1.5mm · 0.94mm/px · 5 of 56 slices shown]
[im 1/56]
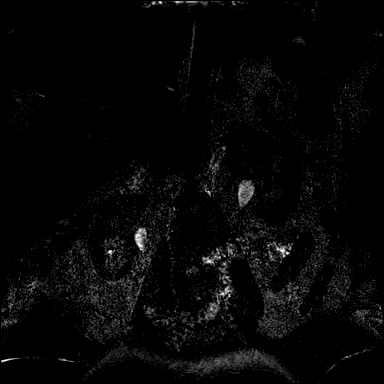
[im 14/56]
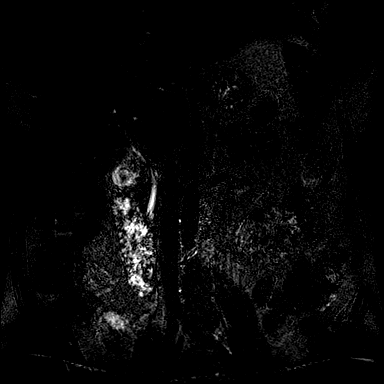
[im 28/56]
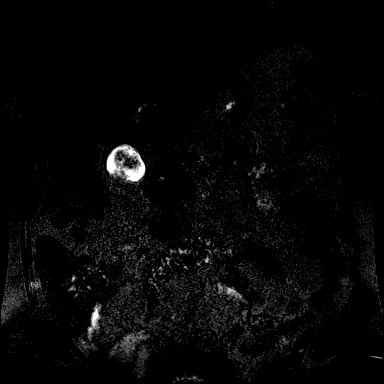
[im 42/56]
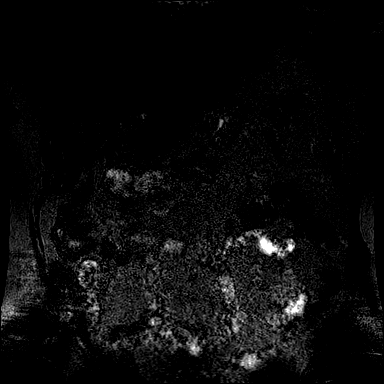
[im 56/56]
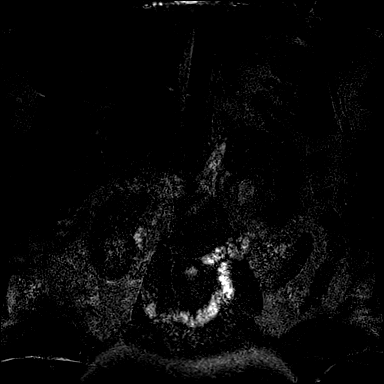

[Series 16: cor thins · coronal · 3.5mm · 0.94mm/px · 1 of 16 slices shown]
[im 1/16]
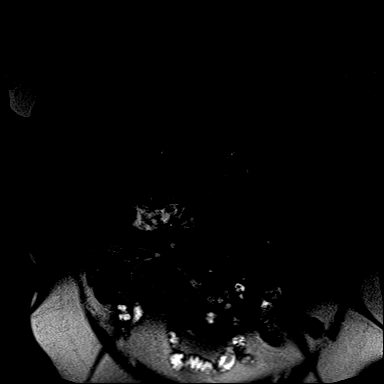

[Series 100: T2 fat-sat · axial · 6.5mm · 1.48mm/px · z∈[-70,+187]mm · 3 of 34 slices shown]
[im 1/34]
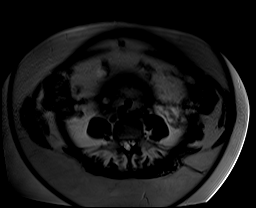
[im 17/34]
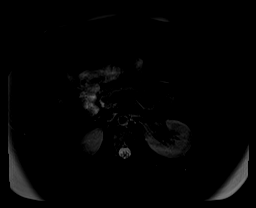
[im 34/34]
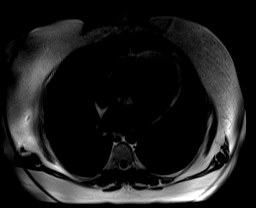

[39 of 48 positions shown; findings below may reference images not displayed]

FINDINGS: Lower chest: No acute findings.

Hepatobiliary: No mass or other focal parenchymal abnormality
identified. Diffuse hepatic steatosis noted. Previous
cholecystectomy. No significant biliary dilatation. No
choledocholithiasis identified.

Pancreas: No mass, inflammatory changes, or other parenchymal
abnormality identified.

Spleen:  Within normal limits in size and appearance.

Adrenals/Urinary Tract: No masses identified. No evidence of
hydronephrosis.

Stomach/Bowel: Visualized portions within the abdomen are
unremarkable.

Vascular/Lymphatic: No pathologically enlarged lymph nodes
identified. No abdominal aortic aneurysm demonstrated.

Other:  None.

Musculoskeletal: No suspicious bone lesions identified.
IMPRESSION: 1. No findings to explain patient's right upper quadrant pain.
2. Status post cholecystectomy. No biliary dilatation and no
evidence for choledocholithiasis identified.
3. Hepatic steatosis.

## 2018-03-09 DIAGNOSIS — H8112 Benign paroxysmal vertigo, left ear: Secondary | ICD-10-CM | POA: Diagnosis not present

## 2018-03-20 ENCOUNTER — Encounter: Payer: Self-pay | Admitting: Internal Medicine

## 2018-04-12 ENCOUNTER — Encounter: Payer: Self-pay | Admitting: Internal Medicine

## 2018-05-03 ENCOUNTER — Encounter: Payer: Self-pay | Admitting: Internal Medicine

## 2018-05-23 ENCOUNTER — Other Ambulatory Visit: Payer: Self-pay | Admitting: Internal Medicine

## 2018-06-27 ENCOUNTER — Encounter: Payer: Self-pay | Admitting: Internal Medicine

## 2018-06-27 ENCOUNTER — Ambulatory Visit (INDEPENDENT_AMBULATORY_CARE_PROVIDER_SITE_OTHER): Payer: 59 | Admitting: Internal Medicine

## 2018-06-27 ENCOUNTER — Telehealth: Payer: Self-pay | Admitting: Internal Medicine

## 2018-06-27 ENCOUNTER — Other Ambulatory Visit: Payer: Self-pay

## 2018-06-27 ENCOUNTER — Other Ambulatory Visit (INDEPENDENT_AMBULATORY_CARE_PROVIDER_SITE_OTHER): Payer: 59

## 2018-06-27 VITALS — BP 129/66

## 2018-06-27 DIAGNOSIS — E119 Type 2 diabetes mellitus without complications: Secondary | ICD-10-CM

## 2018-06-27 DIAGNOSIS — L089 Local infection of the skin and subcutaneous tissue, unspecified: Secondary | ICD-10-CM

## 2018-06-27 DIAGNOSIS — E1129 Type 2 diabetes mellitus with other diabetic kidney complication: Secondary | ICD-10-CM

## 2018-06-27 DIAGNOSIS — R809 Proteinuria, unspecified: Secondary | ICD-10-CM

## 2018-06-27 DIAGNOSIS — L97921 Non-pressure chronic ulcer of unspecified part of left lower leg limited to breakdown of skin: Secondary | ICD-10-CM | POA: Diagnosis not present

## 2018-06-27 DIAGNOSIS — F419 Anxiety disorder, unspecified: Secondary | ICD-10-CM | POA: Diagnosis not present

## 2018-06-27 DIAGNOSIS — I10 Essential (primary) hypertension: Secondary | ICD-10-CM | POA: Diagnosis not present

## 2018-06-27 MED ORDER — ALPRAZOLAM 0.25 MG PO TABS: ORAL_TABLET | ORAL | 0 refills | Status: DC

## 2018-06-27 MED ORDER — SULFAMETHOXAZOLE-TRIMETHOPRIM 800-160 MG PO TABS: 1.0000 | ORAL_TABLET | Freq: Two times a day (BID) | ORAL | 0 refills | Status: DC

## 2018-06-27 NOTE — Assessment & Plan Note (Signed)
Currently well-controlled since starting Trulicity  .  hemoglobin A1c has been iat goal of less than 7.0 . Patient is reminded to return for labs , schedule an annual eye exam . Patient has no microalbuminuria. Patient is tolerating statin therapy for CAD risk reduction and on ACE/ARB for renal protection and hypertension .  Lab Results  Component Value Date   HGBA1C 6.2 10/25/2017    Lab Results  Component Value Date   MICROALBUR <0.7 03/18/2017

## 2018-06-27 NOTE — Progress Notes (Signed)
Virtual Visit via Doxy,me  This visit type was conducted due to national recommendations for restrictions regarding the COVID-19 pandemic (e.g. social distancing).  This format is felt to be most appropriate for this patient at this time.  All issues noted in this document were discussed and addressed.  No physical exam was performed (except for noted visual exam findings with Video Visits).   I connected with@ on 06/27/18 at  9:00 AM EDT by a video enabled telemedicine application or telephone and verified that I am speaking with the correct person using two identifiers. Location patient: home Location provider: work Persons participating in the virtual visit: patient, provider  I discussed the limitations, risks, security and privacy concerns of performing an evaluation and management service by telephone and the availability of in person appointments. I also discussed with the patient that there may be a patient responsible charge related to this service. The patient expressed understanding and agreed to proceed.  Reason for visit: 1) non healing traumatic "sore " on leg  HPI:   58 yr old female with type 2 dm , HTN LAST SEEN AUGUST 2019  The patient has no signs or symptoms of COVID 19 infection (fever, cough, sore throat  or shortness of breath beyond what is typical for patient).  Patient denies contact with other persons with the above mentioned symptoms or with anyone confirmed to have COVID 19   and has been working from home since [place].   oned symptoms    Right lower leg skin infection.  Started 3 weeks ago as an nocturnal itch that she scratched without looking at the area until the following morning.  Felt like a bug bite.  Next day noticed an open sore that has not been draining or enlarging.  Area has developed erythema . Mild   warmth without drainage. Denies fever,  Systemic symptoms ,  Has been applying a salve called  mela gel  (petroleum based) salve and topcial  antibiotic without improvement.  Keeping it covered only at night,  But leaving it  Open to air during day.   3 month follow up on Type 2 DM, recent diagnosis,  Hypertension, hyperlipidemia and obesity.  She has had an intentional weight loss of 20 lbs since last visit by reducing the sugar in her diet.  She has attended the [facility] pharmacy Diabetes Education program.  She has been keeping her carbohydrate content well under the recommended  45 carbs per meal by elininiating sweetened tea and soda and cutting out bagels for breakfast.  She is eating oatmeal prepared from  steel cut oatsfor breakfast instead and  checks her sugars once daily in the morning, usually.   cbgs have been consistently under 130.   ROS: See pertinent positives and negatives per HPI.  Past Medical History:  Diagnosis Date  . Allergy   . Anemia   . Anxiety   . Arthritis   . Depression   . Diabetes mellitus without complication ([place])   . Pre-diabetes   . Swelling     Past Surgical History:  Procedure Laterality Date  . [place]  . CHOLECYSTECTOMY  2009  . DILATION AND CURETTAGE OF UTERUS  2009  . TONSILLECTOMY AND ADENOIDECTOMY      Family History  Problem Relation Age of Onset  . Heart disease Father   . Alcohol abuse Father   . Anxiety disorder Father   . Pancreatitis Father   . Sudden death Father   .  Diabetes Paternal Grandmother   . Breast cancer Maternal Aunt 48  . Brain cancer Maternal Uncle   . Cancer Maternal Aunt   . Colon cancer Neg Hx   . Esophageal cancer Neg Hx   . Stomach cancer Neg Hx     SOCIAL HX: MARRIED ,  [employer] employee  Current Outpatient Medications:  .  albuterol (PROVENTIL HFA;VENTOLIN HFA) 108 (90 Base) MCG/ACT inhaler, Inhale 2 puffs into the lungs every 6 (six) hours as needed for wheezing or shortness of breath., Disp: 1 Inhaler, Rfl: 0 .  ALPRAZolam (XANAX) 0.25 MG tablet, TAKE ONE TABLET BY MOUTH AT BEDTIME AS NEEDED FOR ANXIETY, Disp: 30  tablet, Rfl: 0 .  cetirizine (ZYRTEC ALLERGY) 10 MG tablet, Take 10 mg by mouth daily., Disp: , Rfl:  .  Dulaglutide (TRULICITY) 7.02 OV/7.8HY SOPN, INJECT 0.5 MILLILITER INTO THE SKIN ONCE A WEEK, Disp: 2 mL, Rfl: 11 .  escitalopram (LEXAPRO) 10 MG tablet, TAKE 1 TO 2 TABLETS BY MOUTH DAILY, Disp: 180 tablet, Rfl: 1 .  esomeprazole (NEXIUM) 40 MG capsule, TAKE 1 CAPSULE BY MOUTH DAILY AT 12 NOON., Disp: 90 capsule, Rfl: 1 .  glucose blood test strip, BAYER CONTOUR TEST (In Vitro Strip)  1 (one) Strip two times daily, and as needed for 0 days  Quantity: 100;  Refills: 12   Ordered :11-Jun-2014  Althea Charon ;  Started 11-Jun-2014 Active Comments: Medication taken as needed., Disp: 100 each, Rfl: 12 .  hydrochlorothiazide (HYDRODIURIL) 25 MG tablet, Take 1 tablet (25 mg total) by mouth daily., Disp: 90 tablet, Rfl: 0 .  lisinopril (PRINIVIL,ZESTRIL) 5 MG tablet, TAKE 1 TABLET (5 MG TOTAL) BY MOUTH DAILY., Disp: 90 tablet, Rfl: 3 .  metFORMIN (GLUCOPHAGE-XR) 500 MG 24 hr tablet, TAKE 2 TABLETS BY MOUTH DAILY WITH BREAKFAST, Disp: 180 tablet, Rfl: 1 .  sucralfate (CARAFATE) 1 g tablet, Take 1 tablet (1 g total) by mouth 4 (four) times daily -  with meals and at bedtime., Disp: 120 tablet, Rfl: 1 .  sulfamethoxazole-trimethoprim (BACTRIM DS) 800-160 MG tablet, Take 1 tablet by mouth 2 (two) times daily., Disp: 14 tablet, Rfl: 0  EXAM:  VITALS per patient if applicable:  GENERAL: alert, oriented, appears well and in no acute distress  HEENT: atraumatic, conjunttiva clear, no obvious abnormalities on inspection of external nose and ears  NECK: normal movements of the head and neck  LUNGS: on inspection no signs of respiratory distress, breathing rate appears normal, no obvious gross SOB, gasping or wheezing  CV: no obvious cyanosis  MS: moves all visible extremities without noticeable abnormality   Skin: dime sized ulcer with 0.5 cm rim of erythema a  PSYCH/NEURO: pleasant and cooperative,  no obvious depression or anxiety, speech and thought processing grossly intact  ASSESSMENT AND PLAN:  Discussed the following assessment and plan:  Type 2 diabetes mellitus without complication, without long-term current use of insulin ([facility]) - Plan: Lipid panel, Hemoglobin A1c, Comprehensive metabolic panel  Acute anxiety - Plan: ALPRAZolam (XANAX) 0.25 MG tablet  Microalbuminuria due to type 2 diabetes mellitus ([place]) - Plan: Microalbumin / creatinine urine ratio  Skin infection - Plan: CBC with Differential/Platelet  Leg ulcer, left, limited to breakdown of skin (HCC)  Essential hypertension  Leg ulcer, left, limited to breakdown of skin ([facility]) failure to resolve after 3 weeks ,.  Advised to dc use of topicals,  keep covered,  And treat empirically for MTSA with Septra Ds.. diabetes control may be an aggravating factor  Diabetes mellitus, type 2 Currently well-controlled since starting Trulicity  .  hemoglobin A1c has been iat goal of less than 7.0 . Patient is reminded to return for labs , schedule an annual eye exam . Patient has no microalbuminuria. Patient is tolerating statin therapy for CAD risk reduction and on ACE/ARB for renal protection and hypertension .  Lab Results  Component Value Date   HGBA1C 6.2 10/25/2017    Lab Results  Component Value Date   MICROALBUR <0.7 03/18/2017      Essential hypertension Patient is taking her medications as prescribed and notes no adverse effects.  Home BP readings have not been done  But her reading today at the hospital was < 120/70  She is avoiding added salt in her diet and walking regularly about 3 times per week for exercise  .    I discussed the assessment and treatment plan with the patient. The patient was provided an opportunity to ask questions and all were answered. The patient agreed with the plan and demonstrated an understanding of the instructions.   The patient was advised to call back or seek an in-person  evaluation if the symptoms worsen or if the condition fails to improve as anticipated.  I provided  25 minutes of non-face-to-face time during this encounter.   Crecencio Mc, MD

## 2018-06-27 NOTE — Patient Instructions (Signed)
You are scheduled for a Fasting lab appt at 1:30 today MAKE SURE YOU CAN PEE!!!!  Septra DS one tablet twice daily with food for the skin infection  KEEP IT COVERED WITH A CLEAN NON STICK BANDAGE  NO MORE SALVES.  AIR DAY AFTER SHOWER AND USE DIAL SOAP

## 2018-06-27 NOTE — Assessment & Plan Note (Signed)
Patient is taking her medications as prescribed and notes no adverse effects.  Home BP readings have not been done  But her reading today at the hospital was < 120/70  She is avoiding added salt in her diet and walking regularly about 3 times per week for exercise  .

## 2018-06-27 NOTE — Telephone Encounter (Signed)
PT was at hospital today BP is 129/66, Dr. Derrel Nip wanted to know her BP.

## 2018-06-27 NOTE — Assessment & Plan Note (Signed)
failure to resolve after 3 weeks ,.  Advised to dc use of topicals,  keep covered,  And treat empirically for MTSA with Septra Ds.. diabetes control may be an aggravating factor

## 2018-06-28 ENCOUNTER — Encounter: Payer: Self-pay | Admitting: Internal Medicine

## 2018-06-28 LAB — MICROALBUMIN / CREATININE URINE RATIO
Creatinine,U: 112 mg/dL
Microalb Creat Ratio: 0.9 mg/g (ref 0.0–30.0)
Microalb, Ur: 1 mg/dL (ref 0.0–1.9)

## 2018-06-28 LAB — CBC WITH DIFFERENTIAL/PLATELET
Basophils Absolute: 0 10*3/uL (ref 0.0–0.1)
Basophils Relative: 0.6 % (ref 0.0–3.0)
Eosinophils Absolute: 0.1 10*3/uL (ref 0.0–0.7)
Eosinophils Relative: 1.8 % (ref 0.0–5.0)
HCT: 44.4 % (ref 36.0–46.0)
Hemoglobin: 15.1 g/dL — ABNORMAL HIGH (ref 12.0–15.0)
Lymphocytes Relative: 27.8 % (ref 12.0–46.0)
Lymphs Abs: 1.6 10*3/uL (ref 0.7–4.0)
MCHC: 34 g/dL (ref 30.0–36.0)
MCV: 88.8 fl (ref 78.0–100.0)
Monocytes Absolute: 0.5 10*3/uL (ref 0.1–1.0)
Monocytes Relative: 8.2 % (ref 3.0–12.0)
Neutro Abs: 3.6 10*3/uL (ref 1.4–7.7)
Neutrophils Relative %: 61.6 % (ref 43.0–77.0)
Platelets: 259 10*3/uL (ref 150.0–400.0)
RBC: 5 Mil/uL (ref 3.87–5.11)
RDW: 14.1 % (ref 11.5–15.5)
WBC: 5.8 10*3/uL (ref 4.0–10.5)

## 2018-06-28 LAB — LIPID PANEL
Cholesterol: 215 mg/dL — ABNORMAL HIGH (ref 0–200)
HDL: 60.8 mg/dL (ref >39.00)
LDL Cholesterol: 126 mg/dL — ABNORMAL HIGH (ref 0–99)
NonHDL: 154.16
Total CHOL/HDL Ratio: 4
Triglycerides: 143 mg/dL (ref 0.0–149.0)
VLDL: 28.6 mg/dL (ref 0.0–40.0)

## 2018-06-28 LAB — HEMOGLOBIN A1C: Hgb A1c MFr Bld: 6.3 % (ref 4.6–6.5)

## 2018-06-28 LAB — COMPREHENSIVE METABOLIC PANEL
ALT: 45 U/L — ABNORMAL HIGH (ref 0–35)
AST: 23 U/L (ref 0–37)
Albumin: 4.6 g/dL (ref 3.5–5.2)
Alkaline Phosphatase: 83 U/L (ref 39–117)
BUN: 13 mg/dL (ref 6–23)
CO2: 29 mEq/L (ref 19–32)
Calcium: 9.3 mg/dL (ref 8.4–10.5)
Chloride: 103 mEq/L (ref 96–112)
Creatinine, Ser: 0.62 mg/dL (ref 0.40–1.20)
GFR: 98.93 mL/min (ref >60.00)
Glucose, Bld: 91 mg/dL (ref 70–99)
Potassium: 4.5 mEq/L (ref 3.5–5.1)
Sodium: 140 mEq/L (ref 135–145)
Total Bilirubin: 0.5 mg/dL (ref 0.2–1.2)
Total Protein: 7 g/dL (ref 6.0–8.3)

## 2018-06-29 ENCOUNTER — Other Ambulatory Visit: Payer: Self-pay | Admitting: Internal Medicine

## 2018-06-29 DIAGNOSIS — R740 Nonspecific elevation of levels of transaminase and lactic acid dehydrogenase [LDH]: Principal | ICD-10-CM

## 2018-06-29 DIAGNOSIS — R7401 Elevation of levels of liver transaminase levels: Secondary | ICD-10-CM

## 2018-08-21 ENCOUNTER — Telehealth: Payer: Self-pay | Admitting: Internal Medicine

## 2018-08-21 ENCOUNTER — Other Ambulatory Visit: Payer: Self-pay | Admitting: Internal Medicine

## 2018-08-21 DIAGNOSIS — E119 Type 2 diabetes mellitus without complications: Secondary | ICD-10-CM

## 2018-08-21 MED ORDER — TELMISARTAN 20 MG PO TABS: 20.0000 mg | ORAL_TABLET | Freq: Every day | ORAL | 1 refills | Status: DC

## 2018-08-21 MED ORDER — METFORMIN HCL 500 MG PO TABS: 500.0000 mg | ORAL_TABLET | Freq: Two times a day (BID) | ORAL | 3 refills | Status: DC

## 2018-08-21 NOTE — Progress Notes (Signed)
MyChart message sent re lisinopril and metformin XR dicsontinued

## 2018-08-25 ENCOUNTER — Encounter: Payer: Self-pay | Admitting: Internal Medicine

## 2018-10-02 ENCOUNTER — Encounter: Payer: 59 | Admitting: Internal Medicine

## 2018-11-16 DIAGNOSIS — H52223 Regular astigmatism, bilateral: Secondary | ICD-10-CM | POA: Diagnosis not present

## 2018-11-16 DIAGNOSIS — E119 Type 2 diabetes mellitus without complications: Secondary | ICD-10-CM | POA: Diagnosis not present

## 2018-11-16 DIAGNOSIS — H43313 Vitreous membranes and strands, bilateral: Secondary | ICD-10-CM | POA: Diagnosis not present

## 2018-11-16 DIAGNOSIS — H524 Presbyopia: Secondary | ICD-10-CM | POA: Diagnosis not present

## 2018-11-16 LAB — HM DIABETES EYE EXAM

## 2018-11-22 ENCOUNTER — Encounter: Payer: 59 | Admitting: Internal Medicine

## 2019-01-01 ENCOUNTER — Other Ambulatory Visit: Payer: Self-pay | Admitting: Internal Medicine

## 2019-01-01 DIAGNOSIS — K219 Gastro-esophageal reflux disease without esophagitis: Secondary | ICD-10-CM

## 2019-02-01 ENCOUNTER — Encounter: Payer: Self-pay | Admitting: Internal Medicine

## 2019-02-01 ENCOUNTER — Ambulatory Visit (INDEPENDENT_AMBULATORY_CARE_PROVIDER_SITE_OTHER): Payer: 59 | Admitting: Internal Medicine

## 2019-02-01 ENCOUNTER — Other Ambulatory Visit: Payer: Self-pay

## 2019-02-01 VITALS — BP 117/73 | HR 89 | Ht 65.0 in | Wt 206.0 lb

## 2019-02-01 DIAGNOSIS — E119 Type 2 diabetes mellitus without complications: Secondary | ICD-10-CM | POA: Diagnosis not present

## 2019-02-01 DIAGNOSIS — F419 Anxiety disorder, unspecified: Secondary | ICD-10-CM

## 2019-02-01 DIAGNOSIS — Z0000 Encounter for general adult medical examination without abnormal findings: Secondary | ICD-10-CM | POA: Diagnosis not present

## 2019-02-01 DIAGNOSIS — Z1159 Encounter for screening for other viral diseases: Secondary | ICD-10-CM

## 2019-02-01 DIAGNOSIS — R748 Abnormal levels of other serum enzymes: Secondary | ICD-10-CM

## 2019-02-01 DIAGNOSIS — I10 Essential (primary) hypertension: Secondary | ICD-10-CM

## 2019-02-01 DIAGNOSIS — Z1231 Encounter for screening mammogram for malignant neoplasm of breast: Secondary | ICD-10-CM | POA: Diagnosis not present

## 2019-02-01 MED ORDER — ALPRAZOLAM 0.25 MG PO TABS: ORAL_TABLET | ORAL | 3 refills | Status: DC

## 2019-02-01 MED ORDER — ESCITALOPRAM OXALATE 20 MG PO TABS: 20.0000 mg | ORAL_TABLET | Freq: Every day | ORAL | 1 refills | Status: DC

## 2019-02-01 MED ORDER — TRULICITY 0.75 MG/0.5ML ~~LOC~~ SOAJ: SUBCUTANEOUS | 11 refills | Status: DC

## 2019-02-01 NOTE — Progress Notes (Signed)
Virtual Visit via [email or web address]   This visit type was conducted due to national recommendations for restrictions regarding the COVID-19 pandemic (e.g. social distancing).  This format is felt to be most appropriate for this patient at this time.  All issues noted in this document were discussed and addressed.  No physical exam was performed (except for noted visual exam findings with Video Visits).   I connected with@ on 02/01/19 at  8:30 AM EST by a video enabled telemedicine application and verified that I am speaking with the correct person using two identifiers. Location patient: home Location provider: work or home office Persons participating in the virtual visit: patient, provider  I discussed the limitations, risks, security and privacy concerns of performing an evaluation and management service by telephone and the availability of in person appointments. I also discussed with the patient that there may be a patient responsible charge related to this service. The patient expressed understanding and agreed to proceed.  Reason for visit: ANNUAL EXAM   HPI:  Patient ID: Miranda Huang, female    DOB: 1961/02/06  Age: 57 y.o. MRN: [id number]  The patient is here for her annual CPE  Examination.  The risk factors are reflected in the social history.  The roster of all physicians providing medical care to patient - is listed in the Snapshot section of the chart.  Activities of daily living:  The patient is 100% independent in all ADLs: dressing, toileting, feeding as well as independent mobility  Home safety : The patient has smoke detectors in the home. They wear seatbelts.  There are no firearms at home. There is no violence in the home.   There is no risks for hepatitis, STDs or HIV. There is no   history of blood transfusion. They have no travel history to infectious disease endemic areas of the world.  The patient has seen their dentist in the last six month. They have seen their eye  doctor in the last year. They deny hearing difficulty with regard to whispered voices and some television programs.  They have deferred audiologic testing in the last year.  They do not  have excessive sun exposure. Discussed the need for sun protection: hats, long sleeves and use of sunscreen if there is significant sun exposure.   Diet: the importance of a healthy diet is discussed. They do have a healthy diet.  The benefits of regular aerobic exercise were discussed. She walks 4 times per week ,  20 minutes.   Depression screen: there are no signs or vegative symptoms of depression- irritability, change in appetite, anhedonia, sadness/tearfullness.  The following portions of the patient's history were reviewed and updated as appropriate: allergies, current medications, past family history, past medical history,  past surgical history, past social history  and problem list.  Visual acuity was not assessed per patient preference since she has regular follow up with her ophthalmologist. Hearing and body mass index were assessed and reviewed.   During the course of the visit the patient was educated and counseled about appropriate screening and preventive services including : fall prevention , diabetes screening, nutrition counseling, colorectal cancer screening, and recommended immunizations.    CC: The primary encounter diagnosis was Type 2 diabetes mellitus without complication, without long-term current use of insulin ([place]). Diagnoses of Elevated liver enzymes, Encounter for hepatitis C screening test for low risk patient, Encounter for screening mammogram for malignant neoplasm of breast, Acute anxiety, Encounter for preventive health examination, and Essential hypertension were  also pertinent to this visit.  History Miranda Huang has a past medical history of Allergy, Anemia, Anxiety, Arthritis, Depression, Diabetes mellitus without complication ([place]), Pre-diabetes, and Swelling.   She has a past  surgical history that includes Cholecystectomy (2009); Cesarean section (1985 and 1990); Dilation and curettage of uterus (2009); and Tonsillectomy and adenoidectomy.   Her family history includes Alcohol abuse in her father; Anxiety disorder in her father; Brain cancer in her maternal uncle; Breast cancer (age of onset: 48) in her maternal aunt; Cancer in her maternal aunt; Diabetes in her paternal grandmother; Heart disease in her father; Pancreatitis in her father; Sudden death in her father.She reports that she has never smoked. She has never used smokeless tobacco. She reports current alcohol use. She reports that she does not use drugs.  Outpatient Medications Prior to Visit  Medication Sig Dispense Refill  . ACCU-CHEK GUIDE test strip USE AS DIRECTED TWICE DAILY AND AS NEEDED 100 strip 12  . esomeprazole (NEXIUM) 40 MG capsule TAKE 1 CAPSULE BY MOUTH DAILY AT 12 NOON. 90 capsule 1  . hydrochlorothiazide (HYDRODIURIL) 25 MG tablet Take 1 tablet (25 mg total) by mouth daily. 90 tablet 0  . metFORMIN (GLUCOPHAGE) 500 MG tablet Take 1 tablet (500 mg total) by mouth 2 (two) times daily with a meal. 180 tablet 3  . sucralfate (CARAFATE) 1 g tablet TAKE 1 TABLET BY MOUTH 4 TIMES DAILY - WITH MEALS AND AT BEDTIME. 120 tablet 1  . telmisartan (MICARDIS) 20 MG tablet Take 1 tablet (20 mg total) by mouth at bedtime. 90 tablet 1  . ALPRAZolam (XANAX) 0.25 MG tablet TAKE ONE TABLET BY MOUTH AT BEDTIME AS NEEDED FOR ANXIETY 30 tablet 0  . Dulaglutide (TRULICITY) A999333 0000000 SOPN INJECT 0.5 MILLILITER INTO THE SKIN ONCE A WEEK 2 mL 11  . escitalopram (LEXAPRO) 10 MG tablet TAKE 1 TO 2 TABLETS BY MOUTH DAILY 180 tablet 1  . albuterol (PROVENTIL HFA;VENTOLIN HFA) 108 (90 Base) MCG/ACT inhaler Inhale 2 puffs into the lungs every 6 (six) hours as needed for wheezing or shortness of breath. 1 Inhaler 0  . cetirizine (ZYRTEC ALLERGY) 10 MG tablet Take 10 mg by mouth daily.    Marland Kitchen sulfamethoxazole-trimethoprim  (BACTRIM DS) 800-160 MG tablet Take 1 tablet by mouth 2 (two) times daily. 14 tablet 0   No facility-administered medications prior to visit.     Review of Systems   Patient denies headache, fevers, malaise, unintentional weight loss, skin rash, eye pain, sinus congestion and sinus pain, sore throat, dysphagia,  hemoptysis , cough, dyspnea, wheezing, chest pain, palpitations, orthopnea, edema, abdominal pain, nausea, melena, diarrhea, constipation, flank pain, dysuria, hematuria, urinary  Frequency, nocturia, numbness, tingling, seizures,  Focal weakness, Loss of consciousness,  Tremor, insomnia, depression, anxiety, and suicidal ideation.      Objective:  BP 117/73   Pulse 89   Ht 5\' 5"  (1.651 m)   Wt 206 lb (93.4 kg)   BMI 34.28 kg/m   EXAM:  VITALS per patient if applicable:  GENERAL: alert, oriented, appears well and in no acute distress  HEENT: atraumatic, conjunttiva clear, no obvious abnormalities on inspection of external nose and ears  NECK: normal movements of the head and neck  LUNGS: on inspection no signs of respiratory distress, breathing rate appears normal, no obvious gross SOB, gasping or wheezing  CV: no obvious cyanosis  MS: moves all visible extremities without noticeable abnormality  PSYCH/NEURO: pleasant and cooperative, no obvious depression or anxiety, speech and thought  processing grossly intact    Assessment & Plan:   Problem List Items Addressed This Visit      Unprioritized   Diabetes mellitus, type 2 ([place]) - Primary    Currently well-controlled since starting Trulicity  .  hemoglobin A1c has been iat goal of less than 7.0 . Patient is reminded to return for labs , schedule an annual eye exam . Patient has no microalbuminuria. Patient is tolerating statin therapy for CAD risk reduction and on ACE/ARB for renal protection and hypertension .  Lab Results  Component Value Date   HGBA1C 6.3 06/27/2018    Lab Results  Component Value Date    MICROALBUR 1.0 06/27/2018          Relevant Medications   Dulaglutide (TRULICITY) A999333 0000000 SOPN   Other Relevant Orders   Comprehensive metabolic panel   Lipid panel   Hemoglobin A1c   Encounter for preventive health examination    age appropriate education and counseling updated, referrals for preventative services and immunizations addressed, dietary and smoking counseling addressed, most recent labs reviewed.  I have personally reviewed and have noted:  1) the patient's medical and social history 2) The pt's use of alcohol, tobacco, and illicit drugs 3) The patient's current medications and supplements 4) Functional ability including ADL's, fall risk, home safety risk, hearing and visual impairment 5) Diet and physical activities 6) Evidence for depression or mood disorder 7) The patient's height, weight, and BMI have been recorded in the chart  I have made referrals, and provided counseling and education based on review of the above      Essential hypertension    Patient is taking her medications as prescribed and notes no adverse effects.  Home BP readings have not been done  But her reading today at the hospital was < 120/70  She is avoiding added salt in her diet and walking regularly about 4 times per week for exercise  .       Other Visit Diagnoses    Elevated liver enzymes       Encounter for hepatitis C screening test for low risk patient       Relevant Orders   Hepatitis C Antibody   Encounter for screening mammogram for malignant neoplasm of breast       Relevant Orders   3d mammogram [facility]   Acute anxiety       Relevant Medications   ALPRAZolam (XANAX) 0.25 MG tablet   escitalopram (LEXAPRO) 20 MG tablet      I have discontinued Tenaya G. Calvi's albuterol, cetirizine, and sulfamethoxazole-trimethoprim. I have also changed her escitalopram. Additionally, I am having her maintain her hydrochlorothiazide, Accu-Chek Guide, metFORMIN, telmisartan,  sucralfate, esomeprazole, ALPRAZolam, and Trulicity.  Meds ordered this encounter  Medications  . ALPRAZolam (XANAX) 0.25 MG tablet    Sig: TAKE ONE TABLET BY MOUTH AT BEDTIME AS NEEDED FOR ANXIETY    Dispense:  30 tablet    Refill:  3  . Dulaglutide (TRULICITY) A999333 0000000 SOPN    Sig: INJECT 0.5 MILLILITER INTO THE SKIN ONCE A WEEK    Dispense:  2 mL    Refill:  11  . escitalopram (LEXAPRO) 20 MG tablet    Sig: Take 1 tablet (20 mg total) by mouth daily.    Dispense:  90 tablet    Refill:  1    Medications Discontinued During This Encounter  Medication Reason  . albuterol (PROVENTIL HFA;VENTOLIN HFA) 108 (90 Base) MCG/ACT inhaler Patient has  not taken in last 30 days  . cetirizine (ZYRTEC ALLERGY) 10 MG tablet Patient has not taken in last 30 days  . sulfamethoxazole-trimethoprim (BACTRIM DS) 800-160 MG tablet Patient has not taken in last 30 days  . ALPRAZolam (XANAX) 0.25 MG tablet Reorder  . Dulaglutide (TRULICITY) A999333 0000000 SOPN Reorder  . escitalopram (LEXAPRO) 10 MG tablet     Follow-up: No follow-ups on file.   Crecencio Mc, MD     I discussed the assessment and treatment plan with the patient. The patient was provided an opportunity to ask questions and all were answered. The patient agreed with the plan and demonstrated an understanding of the instructions.   The patient was advised to call back or seek an in-person evaluation if the symptoms worsen or if the condition fails to improve as anticipated.   Crecencio Mc, MD

## 2019-02-04 ENCOUNTER — Encounter: Payer: Self-pay | Admitting: Internal Medicine

## 2019-02-04 NOTE — Assessment & Plan Note (Signed)
Currently well-controlled since starting Trulicity  .  hemoglobin A1c has been iat goal of less than 7.0 . Patient is reminded to return for labs , schedule an annual eye exam . Patient has no microalbuminuria. Patient is tolerating statin therapy for CAD risk reduction and on ACE/ARB for renal protection and hypertension .  Lab Results  Component Value Date   HGBA1C 6.3 06/27/2018    Lab Results  Component Value Date   MICROALBUR 1.0 06/27/2018

## 2019-02-04 NOTE — Assessment & Plan Note (Signed)
Patient is taking her medications as prescribed and notes no adverse effects.  Home BP readings have not been done  But her reading today at the hospital was < 120/70  She is avoiding added salt in her diet and walking regularly about 4 times per week for exercise  .

## 2019-02-04 NOTE — Assessment & Plan Note (Signed)

## 2019-02-12 ENCOUNTER — Ambulatory Visit
Admission: RE | Admit: 2019-02-12 | Discharge: 2019-02-12 | Disposition: A | Discharge status: Home / Self Care | Payer: 59 | Source: Ambulatory Visit | Attending: Internal Medicine | Admitting: Internal Medicine

## 2019-02-12 DIAGNOSIS — Z1231 Encounter for screening mammogram for malignant neoplasm of breast: Secondary | ICD-10-CM | POA: Insufficient documentation

## 2019-02-13 ENCOUNTER — Other Ambulatory Visit: Payer: Self-pay

## 2019-02-13 ENCOUNTER — Other Ambulatory Visit (INDEPENDENT_AMBULATORY_CARE_PROVIDER_SITE_OTHER): Payer: 59

## 2019-02-13 DIAGNOSIS — E119 Type 2 diabetes mellitus without complications: Secondary | ICD-10-CM | POA: Diagnosis not present

## 2019-02-13 DIAGNOSIS — R7401 Elevation of levels of liver transaminase levels: Secondary | ICD-10-CM | POA: Diagnosis not present

## 2019-02-13 LAB — COMPREHENSIVE METABOLIC PANEL
ALT: 34 U/L (ref 0–35)
AST: 19 U/L (ref 0–37)
Albumin: 4.1 g/dL (ref 3.5–5.2)
Alkaline Phosphatase: 77 U/L (ref 39–117)
BUN: 12 mg/dL (ref 6–23)
CO2: 28 mEq/L (ref 19–32)
Calcium: 8.7 mg/dL (ref 8.4–10.5)
Chloride: 104 mEq/L (ref 96–112)
Creatinine, Ser: 0.59 mg/dL (ref 0.40–1.20)
GFR: 104.52 mL/min (ref >60.00)
Glucose, Bld: 95 mg/dL (ref 70–99)
Potassium: 4 mEq/L (ref 3.5–5.1)
Sodium: 139 mEq/L (ref 135–145)
Total Bilirubin: 0.3 mg/dL (ref 0.2–1.2)
Total Protein: 6.3 g/dL (ref 6.0–8.3)

## 2019-02-13 LAB — HEPATIC FUNCTION PANEL
ALT: 34 U/L (ref 0–35)
AST: 19 U/L (ref 0–37)
Albumin: 4.1 g/dL (ref 3.5–5.2)
Alkaline Phosphatase: 77 U/L (ref 39–117)
Bilirubin, Direct: 0.1 mg/dL (ref 0.0–0.3)
Total Bilirubin: 0.3 mg/dL (ref 0.2–1.2)
Total Protein: 6.3 g/dL (ref 6.0–8.3)

## 2019-02-13 LAB — LIPID PANEL
Cholesterol: 195 mg/dL (ref 0–200)
HDL: 48.3 mg/dL (ref >39.00)
LDL Cholesterol: 123 mg/dL — ABNORMAL HIGH (ref 0–99)
NonHDL: 146.47
Total CHOL/HDL Ratio: 4
Triglycerides: 118 mg/dL (ref 0.0–149.0)
VLDL: 23.6 mg/dL (ref 0.0–40.0)

## 2019-02-13 LAB — HEMOGLOBIN A1C: Hgb A1c MFr Bld: 6.4 % (ref 4.6–6.5)

## 2019-02-19 ENCOUNTER — Encounter: Payer: Self-pay | Admitting: Internal Medicine

## 2019-02-19 ENCOUNTER — Ambulatory Visit (INDEPENDENT_AMBULATORY_CARE_PROVIDER_SITE_OTHER): Payer: 59 | Admitting: Internal Medicine

## 2019-02-19 VITALS — Ht 65.0 in | Wt 206.0 lb

## 2019-02-19 DIAGNOSIS — E119 Type 2 diabetes mellitus without complications: Secondary | ICD-10-CM | POA: Diagnosis not present

## 2019-02-19 DIAGNOSIS — E7800 Pure hypercholesterolemia, unspecified: Secondary | ICD-10-CM | POA: Diagnosis not present

## 2019-02-19 DIAGNOSIS — K760 Fatty (change of) liver, not elsewhere classified: Secondary | ICD-10-CM | POA: Diagnosis not present

## 2019-02-19 NOTE — Assessment & Plan Note (Signed)
She is reluctant to start statin therapy but will resume red yeast rice.

## 2019-02-19 NOTE — Assessment & Plan Note (Signed)
Noted on 2018 MRCP.  Continue metformin,  Recommend statin but deferred .  Liver enzymes are normal.

## 2019-02-19 NOTE — Assessment & Plan Note (Signed)
well-controlled on current medications.  hemoglobin A1c has been consistently at or  less than 7.0 . Patient is up-to-date on eye exams and foot exam is unchanged today. Patient  Has had  urine microalbumin to creatinine ratio done within the past year and is negative.  Stopping telmisartan today  . Patient is deferring statin therapy for CAD risk reduction

## 2019-02-19 NOTE — Progress Notes (Signed)
Virtual Visit via [email or web address]  This visit type was conducted due to national recommendations for restrictions regarding the COVID-19 pandemic (e.g. social distancing).  This format is felt to be most appropriate for this patient at this time.  All issues noted in this document were discussed and addressed.  No physical exam was performed (except for noted visual exam findings with Video Visits).   I connected with@ on 02/19/19 at  8:00 AM EST by a video enabled telemedicine application  and verified that I am speaking with the correct person using two identifiers. Location patient: home Location provider: work or home office Persons participating in the virtual visit: patient, provider  I discussed the limitations, risks, security and privacy concerns of performing an evaluation and management service by telephone and the availability of in person appointments. I also discussed with the patient that there may be a patient responsible charge related to this service. The patient expressed understanding and agreed to proceed.  Reason for visit: follow up on recent abnormal labs /hyperlipidemia   HPI:  58 yr old female with well controlled type 2 DM, hepatic steatosis  mild hyperlipdemia   Presents to discuss recnet fasting labs.  10 yr risk of CAD using the FRC was recalculated  Without the diagnoss of hypertension,  and risk is 10%.  Patient denies history of hypertension despite use ofhctz and telmisartan.  has been taking hctz for years as a prn edema,  And telmisartan was added  for renal protection,  not for hypertension   Lab Results  Component Value Date   HGBA1C 6.4 02/13/2019      ROS: See pertinent positives and negatives per HPI.  Past Medical History:  Diagnosis Date  . Allergy   . Anemia   . Anxiety   . Arthritis   . Depression   . Diabetes mellitus without complication ([place])   . Pre-diabetes   . Swelling     Past Surgical History:  Procedure Laterality Date  .  [place]  . CHOLECYSTECTOMY  2009  . DILATION AND CURETTAGE OF UTERUS  2009  . TONSILLECTOMY AND ADENOIDECTOMY      Family History  Problem Relation Age of Onset  . Heart disease Father   . Alcohol abuse Father   . Anxiety disorder Father   . Pancreatitis Father   . Sudden death Father   . Diabetes Paternal Grandmother   . Breast cancer Maternal Aunt 48  . Brain cancer Maternal Uncle   . Cancer Maternal Aunt   . Colon cancer Neg Hx   . Esophageal cancer Neg Hx   . Stomach cancer Neg Hx     SOCIAL HX:  reports that she has never smoked. She has never used smokeless tobacco. She reports current alcohol use. She reports that she does not use drugs.   Current Outpatient Medications:  .  ACCU-CHEK GUIDE test strip, USE AS DIRECTED TWICE DAILY AND AS NEEDED, Disp: 100 strip, Rfl: 12 .  ALPRAZolam (XANAX) 0.25 MG tablet, TAKE ONE TABLET BY MOUTH AT BEDTIME AS NEEDED FOR ANXIETY, Disp: 30 tablet, Rfl: 3 .  Dulaglutide (TRULICITY) A999333 0000000 SOPN, INJECT 0.5 MILLILITER INTO THE SKIN ONCE A WEEK, Disp: 2 mL, Rfl: 11 .  escitalopram (LEXAPRO) 20 MG tablet, Take 1 tablet (20 mg total) by mouth daily., Disp: 90 tablet, Rfl: 1 .  esomeprazole (NEXIUM) 40 MG capsule, TAKE 1 CAPSULE BY MOUTH DAILY AT 12 NOON., Disp: 90 capsule, Rfl: 1 .  hydrochlorothiazide (HYDRODIURIL) 25 MG tablet, Take 1 tablet (25 mg total) by mouth daily., Disp: 90 tablet, Rfl: 0 .  metFORMIN (GLUCOPHAGE) 500 MG tablet, Take 1 tablet (500 mg total) by mouth 2 (two) times daily with a meal., Disp: 180 tablet, Rfl: 3 .  sucralfate (CARAFATE) 1 g tablet, TAKE 1 TABLET BY MOUTH 4 TIMES DAILY - WITH MEALS AND AT BEDTIME., Disp: 120 tablet, Rfl: 1 .  telmisartan (MICARDIS) 20 MG tablet, Take 1 tablet (20 mg total) by mouth at bedtime., Disp: 90 tablet, Rfl: 1  EXAM:  VITALS per patient if applicable:  GENERAL: alert, oriented, appears well and in no acute distress  HEENT: atraumatic, conjunttiva  clear, no obvious abnormalities on inspection of external nose and ears  NECK: normal movements of the head and neck  LUNGS: on inspection no signs of respiratory distress, breathing rate appears normal, no obvious gross SOB, gasping or wheezing  CV: no obvious cyanosis  MS: moves all visible extremities without noticeable abnormality  PSYCH/NEURO: pleasant and cooperative, no obvious depression or anxiety, speech and thought processing grossly intact  ASSESSMENT AND PLAN:  Discussed the following assessment and plan:  Type 2 diabetes mellitus without complication, without long-term current use of insulin ([facility])  Hypercholesteremia  Hepatic steatosis  Hypercholesteremia She is reluctant to start statin therapy but will resume red yeast rice.   Diabetes mellitus, type 2 ([place])  well-controlled on current medications.  hemoglobin A1c has been consistently at or  less than 7.0 . Patient is up-to-date on eye exams and foot exam is unchanged today. Patient  Has had  urine microalbumin to creatinine ratio done within the past year and is negative.  Stopping telmisartan today  . Patient is deferring statin therapy for CAD risk reduction   Hepatic steatosis Noted on 2018 MRCP.  Continue metformin,  Recommend statin but deferred .  Liver enzymes are normal.     I discussed the assessment and treatment plan with the patient. The patient was provided an opportunity to ask questions and all were answered. The patient agreed with the plan and demonstrated an understanding of the instructions.   The patient was advised to call back or seek an in-person evaluation if the symptoms worsen or if the condition fails to improve as anticipated.  I provided  25 minutes of non-face-to-face time during this encounter reviewing patient's current problems and past procedures/imaging studies, providing counseling on the above mentioned problems , and coordination  of care .  Crecencio Mc, MD

## 2019-02-27 ENCOUNTER — Other Ambulatory Visit: Payer: Self-pay | Admitting: Internal Medicine

## 2019-03-05 ENCOUNTER — Ambulatory Visit: Payer: 59 | Attending: Internal Medicine

## 2019-03-05 DIAGNOSIS — Z20822 Contact with and (suspected) exposure to covid-19: Secondary | ICD-10-CM

## 2019-03-06 LAB — NOVEL CORONAVIRUS, NAA: SARS-CoV-2, NAA: DETECTED — AB

## 2019-03-08 ENCOUNTER — Other Ambulatory Visit: Payer: Self-pay

## 2019-03-08 ENCOUNTER — Encounter: Payer: Self-pay | Admitting: Internal Medicine

## 2019-03-08 ENCOUNTER — Ambulatory Visit (INDEPENDENT_AMBULATORY_CARE_PROVIDER_SITE_OTHER): Payer: 59 | Admitting: Internal Medicine

## 2019-03-08 DIAGNOSIS — U071 COVID-19: Secondary | ICD-10-CM | POA: Diagnosis not present

## 2019-03-08 MED ORDER — HYDROCOD POLST-CPM POLST ER 10-8 MG/5ML PO SUER: 5.0000 mL | Freq: Every evening | ORAL | 0 refills | Status: DC | PRN

## 2019-03-08 MED ORDER — ALBUTEROL SULFATE HFA 108 (90 BASE) MCG/ACT IN AERS: 2.0000 | INHALATION_SPRAY | Freq: Four times a day (QID) | RESPIRATORY_TRACT | 2 refills | Status: DC | PRN

## 2019-03-08 NOTE — Progress Notes (Signed)
Virtual Visit via [email or web address]  This visit type was conducted due to national recommendations for restrictions regarding the COVID-19 pandemic (e.g. social distancing).  This format is felt to be most appropriate for this patient at this time.  All issues noted in this document were discussed and addressed.  No physical exam was performed (except for noted visual exam findings with Video Visits).   I connected with@ on 03/08/19 at 11:30 AM EST by a video enabled telemedicine application  and verified that I am speaking with the correct person using two identifiers. Location patient: home Location provider: work or home office Persons participating in the virtual visit: patient, provider  I discussed the limitations, risks, security and privacy concerns of performing an evaluation and management service by telephone and the availability of in person appointments. I also discussed with the patient that there may be a patient responsible charge related to this service. The patient expressed understanding and agreed to proceed.   Reason for visit: COVID 12 INFECTION   HPI: 59 yr old female with T2DM, obesity, reactive airway diseases presents with confirmed COVID 19 infection.  developed symptoms of sinus infection on Dec 30th after having brief unmasked contact with neighbors on Sunday Dec 27. Fever of 110.3 on Friay Jan 1 .  tested on Monday Jan 3 and positive test resulted on Jan 4.  Dry cough,  mild headache,  Loss of smell,  Altered taste,  Fatigue.  Husband negative.  Has NOT  had contact with elderly mother who has advanced COPD     ROS: See pertinent positives and negatives per HPI.  Past Medical History:  Diagnosis Date  . Allergy   . Anemia   . Anxiety   . Arthritis   . Depression   . Diabetes mellitus without complication ([place])   . Pre-diabetes   . Swelling     Past Surgical History:  Procedure Laterality Date  . [place]  . CHOLECYSTECTOMY  2009  .  DILATION AND CURETTAGE OF UTERUS  2009  . TONSILLECTOMY AND ADENOIDECTOMY      Family History  Problem Relation Age of Onset  . Heart disease Father   . Alcohol abuse Father   . Anxiety disorder Father   . Pancreatitis Father   . Sudden death Father   . Diabetes Paternal Grandmother   . Breast cancer Maternal Aunt 48  . Brain cancer Maternal Uncle   . Cancer Maternal Aunt   . Colon cancer Neg Hx   . Esophageal cancer Neg Hx   . Stomach cancer Neg Hx     SOCIAL HX:  reports that she has never smoked. She has never used smokeless tobacco. She reports current alcohol use. She reports that she does not use drugs.   Current Outpatient Medications:  .  Accu-Chek FastClix Lancets MISC, USE AS DIRECTED TWICE DAILY AND AS NEEDED, Disp: 102 each, Rfl: 2 .  ACCU-CHEK GUIDE test strip, USE AS DIRECTED TWICE DAILY AND AS NEEDED, Disp: 100 strip, Rfl: 12 .  ALPRAZolam (XANAX) 0.25 MG tablet, TAKE ONE TABLET BY MOUTH AT BEDTIME AS NEEDED FOR ANXIETY, Disp: 30 tablet, Rfl: 3 .  Dulaglutide (TRULICITY) A999333 0000000 SOPN, INJECT 0.5 MILLILITER INTO THE SKIN ONCE A WEEK, Disp: 2 mL, Rfl: 11 .  escitalopram (LEXAPRO) 20 MG tablet, Take 1 tablet (20 mg total) by mouth daily., Disp: 90 tablet, Rfl: 1 .  esomeprazole (NEXIUM) 40 MG capsule, TAKE 1 CAPSULE BY MOUTH DAILY AT 12  NOON., Disp: 90 capsule, Rfl: 1 .  hydrochlorothiazide (HYDRODIURIL) 25 MG tablet, Take 1 tablet (25 mg total) by mouth daily., Disp: 90 tablet, Rfl: 0 .  metFORMIN (GLUCOPHAGE) 500 MG tablet, Take 1 tablet (500 mg total) by mouth 2 (two) times daily with a meal., Disp: 180 tablet, Rfl: 3 .  sucralfate (CARAFATE) 1 g tablet, TAKE 1 TABLET BY MOUTH 4 TIMES DAILY - WITH MEALS AND AT BEDTIME., Disp: 120 tablet, Rfl: 1 .  telmisartan (MICARDIS) 20 MG tablet, Take 1 tablet (20 mg total) by mouth at bedtime., Disp: 90 tablet, Rfl: 1 .  albuterol (VENTOLIN HFA) 108 (90 Base) MCG/ACT inhaler, Inhale 2 puffs into the lungs every 6 (six)  hours as needed for wheezing or shortness of breath., Disp: 8 g, Rfl: 2 .  chlorpheniramine-HYDROcodone (TUSSIONEX PENNKINETIC ER) 10-8 MG/5ML SUER, Take 5 mLs by mouth at bedtime as needed., Disp: 200 mL, Rfl: 0  EXAM:  VITALS per patient if applicable:  GENERAL: alert, oriented, appears well and in no acute distress  HEENT: atraumatic, conjunttiva clear, no obvious abnormalities on inspection of external nose and ears  NECK: normal movements of the head and neck  LUNGS: on inspection no signs of respiratory distress, breathing rate appears normal, no obvious gross SOB, gasping or wheezing  CV: no obvious cyanosis  MS: moves all visible extremities without noticeable abnormality  PSYCH/NEURO: pleasant and cooperative, no obvious depression or anxiety, speech and thought processing grossly intact  ASSESSMENT AND PLAN:  Discussed the following assessment and plan:  COVID-19 virus infection  COVID-19 virus infection Screened for signs and symptoms of respiratory failure. She is not hypoxic .  Adding albuterol and tussionex for symptomatic control of chest tightness, wheezing and cough .  Working from home .      I discussed the assessment and treatment plan with the patient. The patient was provided an opportunity to ask questions and all were answered. The patient agreed with the plan and demonstrated an understanding of the instructions.   The patient was advised to call back or seek an in-person evaluation if the symptoms worsen or if the condition fails to improve as anticipated.   Crecencio Mc, MD

## 2019-03-10 DIAGNOSIS — U071 COVID-19: Secondary | ICD-10-CM | POA: Insufficient documentation

## 2019-03-10 NOTE — Assessment & Plan Note (Addendum)
Screened for signs and symptoms of respiratory failure. She is not hypoxic .  Adding albuterol and tussionex for symptomatic control of chest tightness, wheezing and cough .  Working from home .

## 2019-07-10 ENCOUNTER — Ambulatory Visit: Payer: 59 | Attending: Internal Medicine | Admitting: Occupational Therapy

## 2019-07-10 ENCOUNTER — Other Ambulatory Visit: Payer: Self-pay

## 2019-07-10 DIAGNOSIS — G5601 Carpal tunnel syndrome, right upper limb: Secondary | ICD-10-CM | POA: Insufficient documentation

## 2019-07-10 NOTE — Therapy (Signed)
[facility] [address] [place], [address] Phone: [phone]   Fax:  [phone]  Occupational Therapy Screen  Patient Details  Name: Miranda Huang MRN: [id number] Date of Birth: [birth year] No data recorded  Encounter Date: 07/10/2019  OT End of Session - 07/10/19 1201    Visit Number  0       Past Medical History:  Diagnosis Date  . Allergy   . Anemia   . Anxiety   . Arthritis   . Depression   . Diabetes mellitus without complication ([place])   . Pre-diabetes   . Swelling     Past Surgical History:  Procedure Laterality Date  . [place]  . CHOLECYSTECTOMY  2009  . DILATION AND CURETTAGE OF UTERUS  2009  . TONSILLECTOMY AND ADENOIDECTOMY      There were no vitals filed for this visit.  Subjective Assessment - 07/10/19 1159    Subjective   I started having increase numbness in my R hand with typing on computer , driving , sleeping, curling my hair, holding phone - and some pain too - numbness in my thumb thru middle finger       Pt report increase numbness in her R hand for few months now. Mostly when on computer, curling her hair, holding phone, driving , ect. Pt report numbness in thumb thru middle finger  Upon assessment she is tender over CT and positive Phalen's test Tight in volar forearm - forearm flexors   Upon questioning - pt did pack boxes few months ago - because of renovation to house - and also not working at her regular computer desk - not good setup at the moment But moving back in the house this coming weekend  Contrast done with pt and soft tissue to CT spreads and graston tool nr 2 sweeping on volar forearm   pt to do at home - 2 x day and husband to help with soft tissue  Gentle Tendon glides - 10 reps and Med N glide 5 reps  Fitted with wrist splint to wear most all the time for 2-3 wks - and soft Benik for when on computer   Avoid gripping ,  lateral grip with wrist flexion - to avoid compression in CT- use larger joints and forearm to lift and carry   And avoid holding papers, phone and table with lateral grip and ADDuction of thumb CMC into palm   Will follow up in 2 wks                                 Patient will benefit from skilled therapeutic intervention in order to improve the following deficits and impairments:           Visit Diagnosis: Carpal tunnel syndrome, right upper limb    Problem List Patient Active Problem List   Diagnosis Date Noted  . COVID-19 virus infection 03/10/2019  . Hepatic steatosis 02/19/2019  . Leg ulcer, left, limited to breakdown of skin ([place]) 06/27/2018  . Candidiasis of perineum 03/19/2017  . Encounter for preventive health examination 03/19/2017  . Fibroid uterus 08/08/2016  . Venous insufficiency of both lower extremities 08/06/2015  . Plantar fasciitis, bilateral 08/06/2015  . Menopause 07/09/2014  . Insomnia secondary to anxiety 07/09/2014  . Clinical depression 07/09/2014  . Diabetes mellitus, type 2 ([place]) 07/09/2014  . Hemorrhoid  07/09/2014  . Hypercholesteremia 07/09/2014  . Nonspecific ST-T changes 07/09/2014  . Cervical polyp 07/09/2014  . RAD (reactive airway disease) 07/09/2014  . Tendinitis 07/09/2014  . Avitaminosis D 07/09/2014  . Iron deficiency anemia 08/29/2008  . S/P laparoscopic cholecystectomy 09/20/2007  . Obesity (BMI 30.0-34.9) 03/01/1998  . Adaptive colitis 08/24/1995  . Acid reflux 08/14/1995    Rosalyn Gess OTR/l,CLT 07/10/2019, 12:01 PM  [facility] [address], [place], [address] Phone: [phone]   Fax:  [phone]  Name: Miranda Huang MRN: [id number] Date of Birth: 1961-01-12

## 2019-07-31 ENCOUNTER — Other Ambulatory Visit: Payer: Self-pay

## 2019-07-31 ENCOUNTER — Ambulatory Visit: Payer: 59 | Attending: Internal Medicine | Admitting: Occupational Therapy

## 2019-07-31 DIAGNOSIS — G5601 Carpal tunnel syndrome, right upper limb: Secondary | ICD-10-CM | POA: Insufficient documentation

## 2019-07-31 NOTE — Therapy (Signed)
[facility] [address], [place], [address] Phone: [phone]   Fax:  [phone]  Occupational Therapy Screen  Patient Details  Name: Miranda Huang MRN: [id number] Date of Birth: [birth year] No data recorded  Encounter Date: 07/31/2019  OT End of Session - 07/31/19 0826    Visit Number  0       Past Medical History:  Diagnosis Date  . Allergy   . Anemia   . Anxiety   . Arthritis   . Depression   . Diabetes mellitus without complication ([place])   . Pre-diabetes   . Swelling     Past Surgical History:  Procedure Laterality Date  . [place]  . CHOLECYSTECTOMY  2009  . DILATION AND CURETTAGE OF UTERUS  2009  . TONSILLECTOMY AND ADENOIDECTOMY      There were no vitals filed for this visit.  Subjective Assessment - 07/31/19 0824    Subjective   Doing better- not gone yet - still gong little numb blow drying hair, working on computer - and doing exercises - have to do better with my massage and hot/cold           Pt report improvement  In numbness in her R hand since seen 3 wks ago.  Had symptoms for few months. Mostly when on computer, curling her hair, holding phone, driving , ect. Pt report numbness in thumb thru middle finger  Upon assessment  Today she was less tender over CT and negative Phalen's test But did had some symptoms afterwards  Tight in volar forearm - forearm flexors still -  Upon questioning - pt did pack boxes few months ago - because of renovation to house - and also not working at her regular computer desk - not good setup at the moment She is planning to move back slowly -pt ed on modifications using palm , larger joints, built handles - to decrease compression on MEd N and CT - as well as avoiding gripping with CMC into ADDUCTION   Contrast done with pt and soft tissue to CT spreads and graston tool nr 2 sweeping on volar forearm   pt to do at  home - 2 x day and husband to help with soft tissue  COnt Gentle Tendon glides - 10 reps and Med N glide 5 reps - stop when symptoms and gradually increase reps Cont with wrist splint to wear most all the time for another 3 wks - and soft Benik for when on computer   [place] at 3.3MZ, 20% 1.0 intensity for 4 min at end of session to decrease inflammation   Avoid gripping , lateral grip with wrist flexion - to avoid compression in CT- use larger joints and forearm to lift and carry, enlarge grips    And avoid holding papers, phone and table with lateral grip and ADDuction of thumb CMC into palm   Will follow up in 3 wks                                 Patient will benefit from skilled therapeutic intervention in order to improve the following deficits and impairments:           Visit Diagnosis: Carpal tunnel syndrome, right upper limb    Problem List Patient Active Problem List   Diagnosis Date Noted  . COVID-19 virus infection 03/10/2019  .  Hepatic steatosis 02/19/2019  . Leg ulcer, left, limited to breakdown of skin ([place]) 06/27/2018  . Candidiasis of perineum 03/19/2017  . Encounter for preventive health examination 03/19/2017  . Fibroid uterus 08/08/2016  . Venous insufficiency of both lower extremities 08/06/2015  . Plantar fasciitis, bilateral 08/06/2015  . Menopause 07/09/2014  . Insomnia secondary to anxiety 07/09/2014  . Clinical depression 07/09/2014  . Diabetes mellitus, type 2 ([place]) 07/09/2014  . Hemorrhoid 07/09/2014  . Hypercholesteremia 07/09/2014  . Nonspecific ST-T changes 07/09/2014  . Cervical polyp 07/09/2014  . RAD (reactive airway disease) 07/09/2014  . Tendinitis 07/09/2014  . Avitaminosis D 07/09/2014  . Iron deficiency anemia 08/29/2008  . S/P laparoscopic cholecystectomy 09/20/2007  . Obesity (BMI 30.0-34.9) 03/01/1998  . Adaptive colitis 08/24/1995  . Acid reflux 08/14/1995    Rosalyn Gess  OTR/L,CLT 07/31/2019, 8:27 AM  [facility] [address], [place], [address] Phone: [phone]   Fax:  [phone]  Name: RUMI CERNA MRN: [id number] Date of Birth: [birth year]

## 2019-08-02 ENCOUNTER — Other Ambulatory Visit: Payer: Self-pay | Admitting: Internal Medicine

## 2019-08-02 DIAGNOSIS — E119 Type 2 diabetes mellitus without complications: Secondary | ICD-10-CM

## 2019-08-02 DIAGNOSIS — F419 Anxiety disorder, unspecified: Secondary | ICD-10-CM

## 2019-08-02 MED ORDER — ALPRAZOLAM 0.25 MG PO TABS: ORAL_TABLET | ORAL | 3 refills | Status: DC

## 2019-08-08 ENCOUNTER — Other Ambulatory Visit: Payer: Self-pay | Admitting: Internal Medicine

## 2019-08-23 DIAGNOSIS — H524 Presbyopia: Secondary | ICD-10-CM | POA: Diagnosis not present

## 2019-08-23 DIAGNOSIS — H43313 Vitreous membranes and strands, bilateral: Secondary | ICD-10-CM | POA: Diagnosis not present

## 2019-08-23 DIAGNOSIS — H52223 Regular astigmatism, bilateral: Secondary | ICD-10-CM | POA: Diagnosis not present

## 2019-08-23 DIAGNOSIS — E119 Type 2 diabetes mellitus without complications: Secondary | ICD-10-CM | POA: Diagnosis not present

## 2019-08-23 LAB — HM DIABETES EYE EXAM

## 2019-09-06 ENCOUNTER — Telehealth (INDEPENDENT_AMBULATORY_CARE_PROVIDER_SITE_OTHER): Payer: 59 | Admitting: Internal Medicine

## 2019-09-06 ENCOUNTER — Encounter: Payer: Self-pay | Admitting: Internal Medicine

## 2019-09-06 DIAGNOSIS — L247 Irritant contact dermatitis due to plants, except food: Secondary | ICD-10-CM

## 2019-09-06 MED ORDER — TRIAMCINOLONE ACETONIDE 0.1 % EX CREA
1.0000 | TOPICAL_CREAM | Freq: Two times a day (BID) | CUTANEOUS | 1 refills | Status: DC
Start: 2019-09-06 — End: 2021-06-19

## 2019-09-06 NOTE — Progress Notes (Signed)
Virtual Visit via [facility]  This visit type was conducted due to national recommendations for restrictions regarding the COVID-19 pandemic (e.g. social distancing).  This format is felt to be most appropriate for this patient at this time.  All issues noted in this document were discussed and addressed.  No physical exam was performed (except for noted visual exam findings with Video Visits).   I connected with@ on 09/09/19 at 10:30 AM EDT by a video enabled telemedicine application and verified that I am speaking with the correct person using two identifiers. Location patient: home Location provider: work or home office Persons participating in the virtual visit: patient, provider  I discussed the limitations, risks, security and privacy concerns of performing an evaluation and management service by telephone and the availability of in person appointments. I also discussed with the patient that there may be a patient responsible charge related to this service. The patient expressed understanding and agreed to proceed.  Reason for visit:   HPI:  58 yr old female with type 2 DM,  No history of eczema or psoriasis presents with several blistering lesions that started on her right forearm,  And then recurred on the left side of her neck, and on her left forearm.  The lesions are arranged in a linear fashion and are highly pruritic . She had worked in her yard 2 days prior to the onset of lesions .  She denies fevers, lymphadenopathy and malaise.   ROS: See pertinent positives and negatives per HPI.  Past Medical History:  Diagnosis Date  . Allergy   . Anemia   . Anxiety   . Arthritis   . Depression   . Diabetes mellitus without complication ([place])   . Pre-diabetes   . Swelling     Past Surgical History:  Procedure Laterality Date  . [place]  . CHOLECYSTECTOMY  2009  . DILATION AND CURETTAGE OF UTERUS  2009  . TONSILLECTOMY AND ADENOIDECTOMY      Family  History  Problem Relation Age of Onset  . Heart disease Father   . Alcohol abuse Father   . Anxiety disorder Father   . Pancreatitis Father   . Sudden death Father   . Diabetes Paternal Grandmother   . Breast cancer Maternal Aunt 48  . Brain cancer Maternal Uncle   . Cancer Maternal Aunt   . Colon cancer Neg Hx   . Esophageal cancer Neg Hx   . Stomach cancer Neg Hx     SOCIAL HX:  reports that she has never smoked. She has never used smokeless tobacco. She reports current alcohol use. She reports that she does not use drugs.   Current Outpatient Medications:  .  Accu-Chek FastClix Lancets MISC, USE AS DIRECTED TWICE DAILY AND AS NEEDED, Disp: 102 each, Rfl: 2 .  ACCU-CHEK GUIDE test strip, USE AS DIRECTED TWICE DAILY AND AS NEEDED, Disp: 100 strip, Rfl: 12 .  ALPRAZolam (XANAX) 0.25 MG tablet, TAKE ONE TABLET BY MOUTH AT BEDTIME AS NEEDED FOR ANXIETY, Disp: 30 tablet, Rfl: 3 .  Dulaglutide (TRULICITY) 2.59 DG/3.8VF SOPN, INJECT 0.5 MILLILITER INTO THE SKIN ONCE A WEEK, Disp: 2 mL, Rfl: 11 .  escitalopram (LEXAPRO) 20 MG tablet, Take 1 tablet (20 mg total) by mouth daily., Disp: 90 tablet, Rfl: 1 .  esomeprazole (NEXIUM) 40 MG capsule, TAKE 1 CAPSULE BY MOUTH DAILY AT 12 NOON., Disp: 90 capsule, Rfl: 1 .  hydrochlorothiazide (HYDRODIURIL) 25 MG tablet, Take 1 tablet (  25 mg total) by mouth daily., Disp: 90 tablet, Rfl: 0 .  metFORMIN (GLUCOPHAGE) 500 MG tablet, Take 1 tablet (500 mg total) by mouth 2 (two) times daily with a meal., Disp: 180 tablet, Rfl: 3 .  sucralfate (CARAFATE) 1 g tablet, TAKE 1 TABLET BY MOUTH 4 TIMES DAILY - WITH MEALS AND AT BEDTIME., Disp: 120 tablet, Rfl: 1 .  telmisartan (MICARDIS) 20 MG tablet, TAKE 1 TABLET (20 MG TOTAL) BY MOUTH AT BEDTIME., Disp: 90 tablet, Rfl: 1 .  triamcinolone cream (KENALOG) 0.1 %, Apply 1 application topically 2 (two) times daily., Disp: 80 g, Rfl: 1  EXAM:  VITALS per patient if applicable:  GENERAL: alert, oriented, appears  well and in no acute distress  HEENT: atraumatic, conjunttiva clear, no obvious abnormalities on inspection of external nose and ears  NECK: normal movements of the head and neck  LUNGS: on inspection no signs of respiratory distress, breathing rate appears normal, no obvious gross SOB, gasping or wheezing  CV: no obvious cyanosis  MS: moves all visible extremities without noticeable abnormality  PSYCH/NEURO: pleasant and cooperative, no obvious depression or anxiety, speech and thought processing grossly intact  ASSESSMENT AND PLAN:  Discussed the following assessment and plan:  Contact dermatitis and eczema due to plant  Contact dermatitis and eczema due to plant Given the recent spread form arm to neck. Will prescribe prednisone 60 mg followed by a daily taper     I discussed the assessment and treatment plan with the patient. The patient was provided an opportunity to ask questions and all were answered. The patient agreed with the plan and demonstrated an understanding of the instructions.   The patient was advised to call back or seek an in-person evaluation if the symptoms worsen or if the condition fails to improve as anticipated.  I provided 22 minutes of non-face-to-face time during this encounter.   Crecencio Mc, MD

## 2019-09-09 DIAGNOSIS — L247 Irritant contact dermatitis due to plants, except food: Secondary | ICD-10-CM | POA: Insufficient documentation

## 2019-09-09 NOTE — Assessment & Plan Note (Signed)
Given the recent spread form arm to neck. Will prescribe prednisone 60 mg followed by a daily taper

## 2019-09-11 ENCOUNTER — Other Ambulatory Visit (INDEPENDENT_AMBULATORY_CARE_PROVIDER_SITE_OTHER): Payer: 59

## 2019-09-11 ENCOUNTER — Other Ambulatory Visit: Payer: Self-pay

## 2019-09-11 DIAGNOSIS — E119 Type 2 diabetes mellitus without complications: Secondary | ICD-10-CM | POA: Diagnosis not present

## 2019-09-11 LAB — COMPREHENSIVE METABOLIC PANEL
ALT: 26 U/L (ref 0–35)
AST: 18 U/L (ref 0–37)
Albumin: 4.3 g/dL (ref 3.5–5.2)
Alkaline Phosphatase: 80 U/L (ref 39–117)
BUN: 15 mg/dL (ref 6–23)
CO2: 29 mEq/L (ref 19–32)
Calcium: 9 mg/dL (ref 8.4–10.5)
Chloride: 103 mEq/L (ref 96–112)
Creatinine, Ser: 0.65 mg/dL (ref 0.40–1.20)
GFR: 93.29 mL/min (ref >60.00)
Glucose, Bld: 132 mg/dL — ABNORMAL HIGH (ref 70–99)
Potassium: 4.3 mEq/L (ref 3.5–5.1)
Sodium: 140 mEq/L (ref 135–145)
Total Bilirubin: 0.6 mg/dL (ref 0.2–1.2)
Total Protein: 6.7 g/dL (ref 6.0–8.3)

## 2019-09-11 LAB — MICROALBUMIN / CREATININE URINE RATIO
Creatinine,U: 138.5 mg/dL
Microalb Creat Ratio: 0.5 mg/g (ref 0.0–30.0)
Microalb, Ur: 0.8 mg/dL (ref 0.0–1.9)

## 2019-09-11 LAB — LIPID PANEL
Cholesterol: 193 mg/dL (ref 0–200)
HDL: 49.1 mg/dL (ref >39.00)
LDL Cholesterol: 116 mg/dL — ABNORMAL HIGH (ref 0–99)
NonHDL: 144.02
Total CHOL/HDL Ratio: 4
Triglycerides: 142 mg/dL (ref 0.0–149.0)
VLDL: 28.4 mg/dL (ref 0.0–40.0)

## 2019-09-11 LAB — HEMOGLOBIN A1C: Hgb A1c MFr Bld: 6.4 % (ref 4.6–6.5)

## 2019-09-24 ENCOUNTER — Ambulatory Visit: Payer: 59 | Admitting: Internal Medicine

## 2019-10-01 ENCOUNTER — Other Ambulatory Visit: Payer: Self-pay | Admitting: Internal Medicine

## 2019-10-17 ENCOUNTER — Other Ambulatory Visit: Payer: Self-pay | Admitting: Internal Medicine

## 2020-01-30 ENCOUNTER — Other Ambulatory Visit: Payer: Self-pay | Admitting: Internal Medicine

## 2020-03-03 ENCOUNTER — Other Ambulatory Visit: Payer: Self-pay | Admitting: Dentistry

## 2020-04-22 ENCOUNTER — Other Ambulatory Visit: Payer: Self-pay | Admitting: Dentistry

## 2020-04-25 ENCOUNTER — Other Ambulatory Visit: Payer: Self-pay | Admitting: Dentistry

## 2020-05-26 ENCOUNTER — Telehealth: Payer: Self-pay | Admitting: Internal Medicine

## 2020-05-26 DIAGNOSIS — E559 Vitamin D deficiency, unspecified: Secondary | ICD-10-CM

## 2020-05-26 DIAGNOSIS — K760 Fatty (change of) liver, not elsewhere classified: Secondary | ICD-10-CM

## 2020-05-26 DIAGNOSIS — E7800 Pure hypercholesterolemia, unspecified: Secondary | ICD-10-CM

## 2020-05-26 DIAGNOSIS — D509 Iron deficiency anemia, unspecified: Secondary | ICD-10-CM

## 2020-05-26 DIAGNOSIS — E119 Type 2 diabetes mellitus without complications: Secondary | ICD-10-CM

## 2020-05-26 NOTE — Telephone Encounter (Signed)
Patient wants labs before physical on the 18th of April

## 2020-05-27 NOTE — Telephone Encounter (Signed)
Called to schedule patient for labs. Phone continued to ring and did not go to voicemail.

## 2020-05-27 NOTE — Telephone Encounter (Signed)
Fasting labs and urine tests ordered.

## 2020-05-28 NOTE — Telephone Encounter (Signed)
Spoke with pt and scheduled her lab appt.

## 2020-06-09 ENCOUNTER — Other Ambulatory Visit (INDEPENDENT_AMBULATORY_CARE_PROVIDER_SITE_OTHER): Payer: 59

## 2020-06-09 ENCOUNTER — Other Ambulatory Visit: Payer: Self-pay

## 2020-06-09 DIAGNOSIS — E119 Type 2 diabetes mellitus without complications: Secondary | ICD-10-CM | POA: Diagnosis not present

## 2020-06-09 DIAGNOSIS — K760 Fatty (change of) liver, not elsewhere classified: Secondary | ICD-10-CM

## 2020-06-09 DIAGNOSIS — E559 Vitamin D deficiency, unspecified: Secondary | ICD-10-CM | POA: Diagnosis not present

## 2020-06-09 DIAGNOSIS — D509 Iron deficiency anemia, unspecified: Secondary | ICD-10-CM | POA: Diagnosis not present

## 2020-06-09 DIAGNOSIS — E7800 Pure hypercholesterolemia, unspecified: Secondary | ICD-10-CM

## 2020-06-09 LAB — COMPREHENSIVE METABOLIC PANEL
ALT: 26 U/L (ref 0–35)
AST: 17 U/L (ref 0–37)
Albumin: 4.1 g/dL (ref 3.5–5.2)
Alkaline Phosphatase: 67 U/L (ref 39–117)
BUN: 18 mg/dL (ref 6–23)
CO2: 28 mEq/L (ref 19–32)
Calcium: 8.6 mg/dL (ref 8.4–10.5)
Chloride: 105 mEq/L (ref 96–112)
Creatinine, Ser: 0.53 mg/dL (ref 0.40–1.20)
GFR: 100.98 mL/min (ref >60.00)
Glucose, Bld: 106 mg/dL — ABNORMAL HIGH (ref 70–99)
Potassium: 4.3 mEq/L (ref 3.5–5.1)
Sodium: 139 mEq/L (ref 135–145)
Total Bilirubin: 0.4 mg/dL (ref 0.2–1.2)
Total Protein: 6.3 g/dL (ref 6.0–8.3)

## 2020-06-09 LAB — LIPID PANEL
Cholesterol: 169 mg/dL (ref 0–200)
HDL: 52.8 mg/dL (ref >39.00)
LDL Cholesterol: 100 mg/dL — ABNORMAL HIGH (ref 0–99)
NonHDL: 116.59
Total CHOL/HDL Ratio: 3
Triglycerides: 83 mg/dL (ref 0.0–149.0)
VLDL: 16.6 mg/dL (ref 0.0–40.0)

## 2020-06-09 LAB — MICROALBUMIN / CREATININE URINE RATIO
Creatinine,U: 26.8 mg/dL
Microalb Creat Ratio: 2.6 mg/g (ref 0.0–30.0)
Microalb, Ur: <0.7 mg/dL (ref 0.0–1.9)

## 2020-06-09 LAB — CBC WITH DIFFERENTIAL/PLATELET
Basophils Absolute: 0 10*3/uL (ref 0.0–0.1)
Basophils Relative: 1 % (ref 0.0–3.0)
Eosinophils Absolute: 0.1 10*3/uL (ref 0.0–0.7)
Eosinophils Relative: 3.1 % (ref 0.0–5.0)
HCT: 43.2 % (ref 36.0–46.0)
Hemoglobin: 14.5 g/dL (ref 12.0–15.0)
Lymphocytes Relative: 28.6 % (ref 12.0–46.0)
Lymphs Abs: 1.2 10*3/uL (ref 0.7–4.0)
MCHC: 33.6 g/dL (ref 30.0–36.0)
MCV: 90.3 fl (ref 78.0–100.0)
Monocytes Absolute: 0.4 10*3/uL (ref 0.1–1.0)
Monocytes Relative: 9.5 % (ref 3.0–12.0)
Neutro Abs: 2.5 10*3/uL (ref 1.4–7.7)
Neutrophils Relative %: 57.8 % (ref 43.0–77.0)
Platelets: 168 10*3/uL (ref 150.0–400.0)
RBC: 4.78 Mil/uL (ref 3.87–5.11)
RDW: 13.7 % (ref 11.5–15.5)
WBC: 4.3 10*3/uL (ref 4.0–10.5)

## 2020-06-09 LAB — TSH: TSH: 2.57 u[IU]/mL (ref 0.35–4.50)

## 2020-06-09 LAB — HEMOGLOBIN A1C: Hgb A1c MFr Bld: 5.8 % (ref 4.6–6.5)

## 2020-06-09 LAB — VITAMIN D 25 HYDROXY (VIT D DEFICIENCY, FRACTURES): VITD: 28.35 ng/mL — ABNORMAL LOW (ref 30.00–100.00)

## 2020-06-16 ENCOUNTER — Encounter: Payer: 59 | Admitting: Internal Medicine

## 2020-06-18 ENCOUNTER — Other Ambulatory Visit (HOSPITAL_COMMUNITY)
Admission: RE | Admit: 2020-06-18 | Discharge: 2020-06-18 | Disposition: A | Discharge status: Home / Self Care | Payer: 59 | Source: Ambulatory Visit | Attending: Internal Medicine | Admitting: Internal Medicine

## 2020-06-18 ENCOUNTER — Other Ambulatory Visit: Payer: Self-pay

## 2020-06-18 ENCOUNTER — Encounter: Payer: Self-pay | Admitting: Internal Medicine

## 2020-06-18 ENCOUNTER — Ambulatory Visit (INDEPENDENT_AMBULATORY_CARE_PROVIDER_SITE_OTHER): Payer: 59 | Admitting: Internal Medicine

## 2020-06-18 VITALS — BP 110/68 | HR 75 | Temp 97.5°F | Resp 14 | Ht 65.0 in | Wt 176.8 lb

## 2020-06-18 DIAGNOSIS — E559 Vitamin D deficiency, unspecified: Secondary | ICD-10-CM

## 2020-06-18 DIAGNOSIS — Z0000 Encounter for general adult medical examination without abnormal findings: Secondary | ICD-10-CM

## 2020-06-18 DIAGNOSIS — L97921 Non-pressure chronic ulcer of unspecified part of left lower leg limited to breakdown of skin: Secondary | ICD-10-CM

## 2020-06-18 DIAGNOSIS — D509 Iron deficiency anemia, unspecified: Secondary | ICD-10-CM | POA: Diagnosis not present

## 2020-06-18 DIAGNOSIS — Z124 Encounter for screening for malignant neoplasm of cervix: Secondary | ICD-10-CM | POA: Diagnosis not present

## 2020-06-18 DIAGNOSIS — E119 Type 2 diabetes mellitus without complications: Secondary | ICD-10-CM | POA: Diagnosis not present

## 2020-06-18 DIAGNOSIS — F325 Major depressive disorder, single episode, in full remission: Secondary | ICD-10-CM | POA: Diagnosis not present

## 2020-06-18 DIAGNOSIS — K219 Gastro-esophageal reflux disease without esophagitis: Secondary | ICD-10-CM | POA: Diagnosis not present

## 2020-06-18 DIAGNOSIS — F324 Major depressive disorder, single episode, in partial remission: Secondary | ICD-10-CM | POA: Diagnosis not present

## 2020-06-18 DIAGNOSIS — Z1231 Encounter for screening mammogram for malignant neoplasm of breast: Secondary | ICD-10-CM | POA: Diagnosis not present

## 2020-06-18 DIAGNOSIS — E7800 Pure hypercholesterolemia, unspecified: Secondary | ICD-10-CM | POA: Diagnosis not present

## 2020-06-18 DIAGNOSIS — E1129 Type 2 diabetes mellitus with other diabetic kidney complication: Secondary | ICD-10-CM | POA: Diagnosis not present

## 2020-06-18 DIAGNOSIS — N841 Polyp of cervix uteri: Secondary | ICD-10-CM

## 2020-06-18 DIAGNOSIS — E669 Obesity, unspecified: Secondary | ICD-10-CM | POA: Diagnosis not present

## 2020-06-18 DIAGNOSIS — K760 Fatty (change of) liver, not elsewhere classified: Secondary | ICD-10-CM

## 2020-06-18 DIAGNOSIS — F419 Anxiety disorder, unspecified: Secondary | ICD-10-CM | POA: Diagnosis not present

## 2020-06-18 DIAGNOSIS — F5105 Insomnia due to other mental disorder: Secondary | ICD-10-CM | POA: Diagnosis not present

## 2020-06-18 DIAGNOSIS — R809 Proteinuria, unspecified: Secondary | ICD-10-CM

## 2020-06-18 NOTE — Progress Notes (Signed)
Patient ID: Miranda Huang, female    DOB: August 27, 1960  Age: 60 y.o. MRN: [id number]  The patient is here for annual  Preventive examination and management of other chronic and acute problems.   The risk factors are reflected in the social history.  The roster of all physicians providing medical care to patient - is listed in the Snapshot section of the chart.  Activities of daily living:  The patient is 100% independent in all ADLs: dressing, toileting, feeding as well as independent mobility  Home safety : The patient has smoke detectors in the home. They wear seatbelts.  There are no firearms at home. There is no violence in the home.   There is no risks for hepatitis, STDs or HIV. There is no   history of blood transfusion. They have no travel history to infectious disease endemic areas of the world.  The patient has seen their dentist in the last six month. They have seen their eye doctor in the last year. She denies any  hearing difficulty  .  She does not  have excessive sun exposure. Discussed the need for sun protection: hats, long sleeves and use of sunscreen if there is significant sun exposure.   Diet: the importance of a healthy diet is discussed. They do have a healthy diet.  She has lost >25 lbs following the [place].   The benefits of regular aerobic exercise were discussed.  Has not begun a regular exercise program yet   Depression screen: there are no signs or vegative symptoms of depression- irritability, change in appetite, anhedonia, sadness/tearfullness.   The following portions of the patient's history were reviewed and updated as appropriate: allergies, current medications, past family history, past medical history,  past surgical history, past social history  and problem list.  Visual acuity was not assessed per patient preference since she has scheduled annual follow up with her ophthalmologist. Hearing and body mass index were assessed and reviewed.   During  the course of the visit the patient was educated and counseled about appropriate screening and preventive services including : fall prevention , diabetes screening, nutrition counseling, colorectal cancer screening, and recommended immunizations.    CC: The primary encounter diagnosis was Encounter for preventive health examination. Diagnoses of Major depressive disorder with single episode, in partial remission ([place]), Microalbuminuria due to type 2 diabetes mellitus ([place]), Leg ulcer, left, limited to breakdown of skin ([place]), Encounter for screening mammogram for malignant neoplasm of breast, Cervical cancer screening, Obesity (BMI 30.0-34.9), Cervical polyp, Major depressive disorder with single episode, in full remission ([place]), Avitaminosis D, Gastroesophageal reflux disease without esophagitis, Iron deficiency anemia, unspecified iron deficiency anemia type, Insomnia secondary to anxiety, Hypercholesteremia, Hepatic steatosis, and Type 2 diabetes mellitus without complication, without long-term current use of insulin ([place]) were also pertinent to this visit.  DM Type 2:  She  feels generally well,  And has been intentionally losing weight on the [place] . Checking  blood sugars less than once daily at variable times, usually only if she feels she may be having a hypoglycemic event. .  BS have been under 130 fasting and < 150 post prandially.  Denies any recent hypoglycemic events.  Taking  Metformin only for the past month. Denies numbness, burning and tingling of extremities. Appetite is good. Her last  dose of Trulicity was end of March. She has lost 30 lbs and would like to continue start taking ozempic and continue  Metformin .   :  Hypertension: patient checks blood pressure twice weekly at home.  Readings have been for the most part <130/80 at rest . Patient is following a reduced salt diet most days and has not taken  micardis for the past several months.  Home readings 115/60  Obesity: using  [employer] diet and down 30 lbs  Since Oct. 1 not exercising yet  Cc: new onset hair loss ,  Noticed the increased loss when she started losing weigh.  Taking a biotin/collagen supplement    History Miranda Huang has a past medical history of Allergy, Anemia, Anxiety, Arthritis, Depression, Diabetes mellitus without complication ([place]), Nonspecific ST-T changes (07/09/2014), Plantar fasciitis, bilateral (08/06/2015), Pre-diabetes, and Swelling.   She has a past surgical history that includes Cholecystectomy (2009); Cesarean section (1985 and 1990); Dilation and curettage of uterus (2009); and Tonsillectomy and adenoidectomy.   Her family history includes Alcohol abuse in her father; Anxiety disorder in her father; Brain cancer in her maternal uncle; Breast cancer (age of onset: 73) in her maternal aunt; Cancer in her maternal aunt; Diabetes in her paternal grandmother; Heart disease in her father; Pancreatitis in her father; Sudden death in her father.She reports that she has never smoked. She has never used smokeless tobacco. She reports current alcohol use. She reports that she does not use drugs.  Outpatient Medications Prior to Visit  Medication Sig Dispense Refill  . Accu-Chek FastClix Lancets MISC USE AS DIRECTED TWICE DAILY AND AS NEEDED 102 each 2  . ACCU-CHEK GUIDE test strip USE AS DIRECTED TWICE DAILY AND AS NEEDED 100 strip 12  . ALPRAZolam (XANAX) 0.25 MG tablet TAKE ONE TABLET BY MOUTH AT BEDTIME AS NEEDED FOR ANXIETY 30 tablet 3  . Dulaglutide 0.75 MG/0.5ML SOPN INJECT 0.5 MILLILITER INTO THE SKIN ONCE A WEEK 6 mL 11  . escitalopram (LEXAPRO) 20 MG tablet TAKE 1 TABLET BY MOUTH DAILY 90 tablet 1  . hydrochlorothiazide (HYDRODIURIL) 25 MG tablet Take 1 tablet (25 mg total) by mouth daily. (Patient taking differently: Take 25 mg by mouth as needed.) 90 tablet 0  . metFORMIN (GLUCOPHAGE) 500 MG tablet TAKE 1 TABLET BY MOUTH 2 TIMES DAILY WITH A MEAL. 180 tablet 3  . sucralfate (CARAFATE) 1 g tablet  TAKE 1 TABLET BY MOUTH 4 TIMES DAILY - WITH MEALS AND AT BEDTIME. (Patient taking differently: as needed.) 120 tablet 1  . triamcinolone cream (KENALOG) 0.1 % Apply 1 application topically 2 (two) times daily. (Patient taking differently: Apply 1 application topically as needed.) 80 g 1  . telmisartan (MICARDIS) 20 MG tablet TAKE 1 TABLET (20 MG TOTAL) BY MOUTH AT BEDTIME. (Patient not taking: Reported on 06/18/2020) 90 tablet 1  . acetaminophen-codeine (TYLENOL #3) 300-30 MG tablet TAKE 1 - 2 TABLETS BY ORAL ROUTE EVERY 4-6 HOURS AS NEEDED FOR PAIN 8 tablet 0  . amoxicillin (AMOXIL) 500 MG capsule TAKE 2 CAPSULES BY MOUTH NOW, THEN 1 CAPSULE 4 TIMES A DAY UNTIL GONE. 32 capsule 0  . amoxicillin (AMOXIL) 500 MG capsule TAKE 2 CAPSULES BY MOUTH NOW, THEN 1 CAPSULE 4 TIMES DAILY UNTIL GONE. 32 capsule 0  . amoxicillin (AMOXIL) 875 MG tablet TAKE 1 TABLET BY MOUTH TWICE DAILY UNTIL GONE 18 tablet 0  . chlorhexidine (PERIDEX) 0.12 % solution RINSE WITH 1/2 OZ FOR 30 SECONDS AFTER THOROUGH BRUSHING, 3 TIMES A DAY AS DIRECTED 473 mL 12  . esomeprazole (NEXIUM) 40 MG capsule TAKE 1 CAPSULE BY MOUTH DAILY AT 12 NOON. (Patient not taking: Reported on 06/18/2020) 90  capsule 1  . ibuprofen (ADVIL) 600 MG tablet TAKE 1 TABLET BY MOUTH EVERY 4-6 HOURS AS NEEDED (Patient not taking: Reported on 06/18/2020) 24 tablet 0  . methylPREDNISolone (MEDROL DOSEPAK) 4 MG TBPK tablet TAKE ALL 6 TABLETS BY MOUTH ON DAY 1, THEN DECREASE BY ONE TABLET EACH DAY (6-5-4-3-2-1) OR AS DIRECTED ON PACKAGE 21 each 0   No facility-administered medications prior to visit.    Review of Systems   Patient denies headache, fevers, malaise, unintentional weight loss, skin rash, eye pain, sinus congestion and sinus pain, sore throat, dysphagia,  hemoptysis , cough, dyspnea, wheezing, chest pain, palpitations, orthopnea, edema, abdominal pain, nausea, melena, diarrhea, constipation, flank pain, dysuria, hematuria, urinary  Frequency, nocturia,  numbness, tingling, seizures,  Focal weakness, Loss of consciousness,  Tremor, insomnia, depression, anxiety, and suicidal ideation.      Objective:  BP 110/68 (BP Location: Left Arm, Patient Position: Sitting, Cuff Size: Normal)   Pulse 75   Temp (!) 97.5 F (36.4 C) (Oral)   Resp 14   Ht 5\' 5"  (1.651 m)   Wt 176 lb 12.8 oz (80.2 kg)   SpO2 97%   BMI 29.42 kg/m   Physical Exam  General Appearance:    Alert, cooperative, no distress, appears stated age  Head:    Normocephalic, without obvious abnormality, atraumatic  Eyes:    PERRL, conjunctiva/corneas clear, EOM's intact, fundi    benign, both eyes  Ears:    Normal TM's and external ear canals, both ears  Nose:   Nares normal, septum midline, mucosa normal, no drainage    or sinus tenderness  Throat:   Lips, mucosa, and tongue normal; teeth and gums normal  Neck:   Supple, symmetrical, trachea midline, no adenopathy;    thyroid:  no enlargement/tenderness/nodules; no carotid   bruit or JVD  Back:     Symmetric, no curvature, ROM normal, no CVA tenderness  Lungs:     Clear to auscultation bilaterally, respirations unlabored  Chest Wall:    No tenderness or deformity   Heart:    Regular rate and rhythm, S1 and S2 normal, no murmur, rub   or gallop  Breast Exam:    No tenderness, masses, or nipple abnormality  Abdomen:     Soft, non-tender, bowel sounds active all four quadrants,    no masses, no organomegaly  Genitalia:    Pelvic: cervix retroverted, eternal genitalia normal, no adnexal masses or tenderness, no cervical motion tenderness, rectovaginal septum normal, uterus normal size, shape, and consistency and vagina  Atrophic  without discharge  Extremities:   Extremities normal, atraumatic, no cyanosis or edema  Pulses:   2+ and symmetric all extremities  Skin:   Skin color, texture, turgor normal, no rashes or lesions  Lymph nodes:   Cervical, supraclavicular, and axillary nodes normal  Neurologic:   CNII-XII intact,  normal strength, sensation and reflexes    throughout     Assessment & Plan:   Problem List Items Addressed This Visit      Unprioritized   Obesity (BMI 30.0-34.9)    I have congratulated her in reduction of   BMI and encouraged  Continued weight loss with goal of 10% of body weigh over the next 6 months using a low glycemic index diet and regular exercise a minimum of 5 days per week.        Microalbuminuria due to type 2 diabetes mellitus ([place])    Improved with use of and Tolerating telmisartan  Lab  Results  Component Value Date   CREATININE 0.53 06/09/2020   Lab Results  Component Value Date   NA 139 06/09/2020   K 4.3 06/09/2020   CL 105 06/09/2020   CO2 28 06/09/2020   Lab Results  Component Value Date   MICROALBUR <0.7 06/09/2020   MICROALBUR 0.8 09/11/2019           Relevant Orders   Hemoglobin A1c   Lipid panel   Comprehensive metabolic panel   Major depressive disorder with single episode, in partial remission ([place])   RESOLVED: Leg ulcer, left, limited to breakdown of skin ([facility])   RESOLVED: Iron deficiency anemia    Lab Results  Component Value Date   WBC 4.3 06/09/2020   HGB 14.5 06/09/2020   HCT 43.2 06/09/2020   MCV 90.3 06/09/2020   PLT 168.0 06/09/2020         Insomnia secondary to anxiety    Managed with low dose alprazolam prn early waking. The risks and benefits of benzodiazepine use were reviewed with patient today including excessive sedation leading to respiratory depression,  impaired thinking/driving, and addiction.  Patient was advised to avoid concurrent use with alcohol, to use medication only as needed and not to share with others  .       Hypercholesteremia    Her cholesterol is improving with weight loss.  10 yr risk using the AHA calculator is 5%; She remains apprehensive about  statin therapy despite the diagnosis of DM and fatty liver , but is willing to consider if LDL is not < 70  At next check.  Continue use of red yeast  rice.   Lab Results  Component Value Date   CHOL 169 06/09/2020   HDL 52.80 06/09/2020   LDLCALC 100 (H) 06/09/2020   TRIG 83.0 06/09/2020   CHOLHDL 3 06/09/2020         Hepatic steatosis    Noted on 2018 MRCP.  Continue metformin,  Recommend statin but deferred .  Liver enzymes are normal.    Lab Results  Component Value Date   ALT 26 06/09/2020   AST 17 06/09/2020   ALKPHOS 67 06/09/2020   BILITOT 0.4 06/09/2020         Encounter for preventive health examination - Primary    age appropriate education and counseling updated, referrals for preventative services and immunizations addressed, dietary and smoking counseling addressed, most recent labs reviewed.  I have personally reviewed and have noted:  1) the patient's medical and social history 2) The pt's use of alcohol, tobacco, and illicit drugs 3) The patient's current medications and supplements 4) Functional ability including ADL's, fall risk, home safety risk, hearing and visual impairment 5) Diet and physical activities 6) Evidence for depression or mood disorder 7) The patient's height, weight, and BMI have been recorded in the chart  I have made referrals, and provided counseling and education based on review of the above      Diabetes mellitus, type 2 ([place])    Currently managed with metformin only.  She is requesting change in therapy fro Trulicity to Ozempic      Clinical depression    She remains in remission but prefers to continue use of lexapro .  No changes today      Cervical polyp    Probable nabothian cyst.   await PAP smear results ,  making referral to GYN       Relevant Orders   Ambulatory referral to Gynecology  Avitaminosis D    Continue daily supplementation with a minimum dose of 2000 Ius daily   Last vitamin D Lab Results  Component Value Date   VD25OH 28.35 (L) 06/09/2020        Acid reflux    Managed with prn use of sucralfate.        Other Visit Diagnoses     Encounter for screening mammogram for malignant neoplasm of breast       Relevant Orders   MM 3D SCREEN BREAST BILATERAL   Cervical cancer screening       Relevant Orders   Cytology - PAP      I have discontinued Miranda Huang's esomeprazole, ibuprofen, methylPREDNISolone, amoxicillin, chlorhexidine, acetaminophen-codeine, amoxicillin, and amoxicillin. I am also having her maintain her hydrochlorothiazide, Accu-Chek Guide, sucralfate, Accu-Chek FastClix Lancets, ALPRAZolam, telmisartan, triamcinolone cream, Dulaglutide, escitalopram, and metFORMIN.  No orders of the defined types were placed in this encounter.   Medications Discontinued During This Encounter  Medication Reason  . acetaminophen-codeine (TYLENOL #3) 300-30 MG tablet Completed Course  . amoxicillin (AMOXIL) 500 MG capsule Completed Course  . amoxicillin (AMOXIL) 500 MG capsule Completed Course  . amoxicillin (AMOXIL) 875 MG tablet Completed Course  . chlorhexidine (PERIDEX) 0.12 % solution Completed Course  . methylPREDNISolone (MEDROL DOSEPAK) 4 MG TBPK tablet   . esomeprazole (NEXIUM) 40 MG capsule   . ibuprofen (ADVIL) 600 MG tablet     Follow-up: Return in about 3 months (around 09/17/2020) for follow up diabetes.   Crecencio Mc, MD

## 2020-06-18 NOTE — Patient Instructions (Addendum)
Do not resume Trulicity yet  Ozempic or Wegovy?  Whichever is covered  Continue metformin  You are overdue for your your tetanus-diptheria-pertussis vaccine (TDaP) . Check with health at work  Repeat labs in 3 months;  Consider statin therapy  CONGRATULATIONS!!!!!  REFERRAL TO DR sHERONETTE COUSINS FOR EVAL OF CERVICAL POLYP

## 2020-06-19 NOTE — Assessment & Plan Note (Signed)
I have congratulated her in reduction of   BMI and encouraged  Continued weight loss with goal of 10% of body weigh over the next 6 months using a low glycemic index diet and regular exercise a minimum of 5 days per week.    

## 2020-06-20 ENCOUNTER — Encounter: Payer: Self-pay | Admitting: Internal Medicine

## 2020-06-20 LAB — CYTOLOGY - PAP
Comment: NEGATIVE
Diagnosis: NEGATIVE
High risk HPV: NEGATIVE

## 2020-06-20 NOTE — Assessment & Plan Note (Addendum)
Her cholesterol is improving with weight loss.  10 yr risk using the AHA calculator is 5%; She remains apprehensive about  statin therapy despite the diagnosis of DM and fatty liver , but is willing to consider if LDL is not < 70  At next check.  Continue use of red yeast rice.   Lab Results  Component Value Date   CHOL 169 06/09/2020   HDL 52.80 06/09/2020   LDLCALC 100 (H) 06/09/2020   TRIG 83.0 06/09/2020   CHOLHDL 3 06/09/2020

## 2020-06-20 NOTE — Assessment & Plan Note (Signed)
Managed with prn use of sucralfate.

## 2020-06-20 NOTE — Assessment & Plan Note (Signed)
She remains in remission but prefers to continue use of lexapro .  No changes today ?

## 2020-06-20 NOTE — Assessment & Plan Note (Signed)
Managed with low dose alprazolam prn early waking. The risks and benefits of benzodiazepine use were reviewed with patient today including excessive sedation leading to respiratory depression,  impaired thinking/driving, and addiction.  Patient was advised to avoid concurrent use with alcohol, to use medication only as needed and not to share with others  .  ?

## 2020-06-20 NOTE — Assessment & Plan Note (Signed)
Continue daily supplementation with a minimum dose of 2000 Ius daily   Last vitamin D Lab Results  Component Value Date   VD25OH 28.35 (L) 06/09/2020

## 2020-06-20 NOTE — Assessment & Plan Note (Signed)

## 2020-06-20 NOTE — Assessment & Plan Note (Signed)
Improved with use of and Tolerating telmisartan  Lab Results  Component Value Date   CREATININE 0.53 06/09/2020   Lab Results  Component Value Date   NA 139 06/09/2020   K 4.3 06/09/2020   CL 105 06/09/2020   CO2 28 06/09/2020   Lab Results  Component Value Date   MICROALBUR <0.7 06/09/2020   MICROALBUR 0.8 09/11/2019

## 2020-06-20 NOTE — Assessment & Plan Note (Signed)
Noted on 2018 MRCP.  Continue metformin,  Recommend statin but deferred .  Liver enzymes are normal.    Lab Results  Component Value Date   ALT 26 06/09/2020   AST 17 06/09/2020   ALKPHOS 67 06/09/2020   BILITOT 0.4 06/09/2020

## 2020-06-20 NOTE — Assessment & Plan Note (Signed)
Currently managed with metformin only.  She is requesting change in therapy fro Trulicity to [employer] [facility]

## 2020-06-20 NOTE — Assessment & Plan Note (Signed)
Probable nabothian cyst.   await PAP smear results ,  making referral to GYN

## 2020-06-20 NOTE — Assessment & Plan Note (Signed)
Lab Results  Component Value Date   WBC 4.3 06/09/2020   HGB 14.5 06/09/2020   HCT 43.2 06/09/2020   MCV 90.3 06/09/2020   PLT 168.0 06/09/2020

## 2020-06-25 ENCOUNTER — Other Ambulatory Visit: Payer: Self-pay

## 2020-06-25 MED ORDER — OZEMPIC (0.25 OR 0.5 MG/DOSE) 2 MG/1.5ML ~~LOC~~ SOPN
0.5000 mg | PEN_INJECTOR | SUBCUTANEOUS | 2 refills | Status: DC
Start: 2020-06-25 — End: 2020-06-25
  Filled 2020-06-25: qty 1.5, 28d supply, fill #0

## 2020-06-25 MED ORDER — ESCITALOPRAM OXALATE 20 MG PO TABS
ORAL_TABLET | Freq: Every day | ORAL | 1 refills | Status: DC
  Filled 2020-06-25: qty 90, 90d supply, fill #0
  Filled 2020-10-09: qty 90, 90d supply, fill #1

## 2020-06-25 MED ORDER — OZEMPIC (0.25 OR 0.5 MG/DOSE) 2 MG/1.5ML ~~LOC~~ SOPN
0.5000 mg | PEN_INJECTOR | SUBCUTANEOUS | 2 refills | Status: DC
Start: 2020-06-25 — End: 2020-10-09
  Filled 2020-06-25: qty 1.5, 28d supply, fill #0
  Filled 2020-08-11: qty 1.5, 28d supply, fill #1
  Filled 2020-09-09: qty 1.5, 28d supply, fill #2

## 2020-07-23 DIAGNOSIS — H524 Presbyopia: Secondary | ICD-10-CM | POA: Diagnosis not present

## 2020-07-23 LAB — HM DIABETES EYE EXAM

## 2020-08-11 ENCOUNTER — Other Ambulatory Visit: Payer: Self-pay

## 2020-08-11 MED FILL — Metformin HCl Tab 500 MG: ORAL | 90 days supply | Qty: 180 | Fill #0 | Status: AC

## 2020-09-05 ENCOUNTER — Other Ambulatory Visit (HOSPITAL_COMMUNITY): Payer: Self-pay

## 2020-09-09 ENCOUNTER — Other Ambulatory Visit (INDEPENDENT_AMBULATORY_CARE_PROVIDER_SITE_OTHER): Payer: 59

## 2020-09-09 ENCOUNTER — Other Ambulatory Visit: Payer: Self-pay

## 2020-09-09 DIAGNOSIS — E1129 Type 2 diabetes mellitus with other diabetic kidney complication: Secondary | ICD-10-CM

## 2020-09-09 DIAGNOSIS — R809 Proteinuria, unspecified: Secondary | ICD-10-CM | POA: Diagnosis not present

## 2020-09-09 LAB — COMPREHENSIVE METABOLIC PANEL
ALT: 20 U/L (ref 0–35)
AST: 17 U/L (ref 0–37)
Albumin: 4.5 g/dL (ref 3.5–5.2)
Alkaline Phosphatase: 59 U/L (ref 39–117)
BUN: 16 mg/dL (ref 6–23)
CO2: 28 mEq/L (ref 19–32)
Calcium: 9.4 mg/dL (ref 8.4–10.5)
Chloride: 102 mEq/L (ref 96–112)
Creatinine, Ser: 0.63 mg/dL (ref 0.40–1.20)
GFR: 96.69 mL/min (ref >60.00)
Glucose, Bld: 103 mg/dL — ABNORMAL HIGH (ref 70–99)
Potassium: 4.6 mEq/L (ref 3.5–5.1)
Sodium: 138 mEq/L (ref 135–145)
Total Bilirubin: 0.5 mg/dL (ref 0.2–1.2)
Total Protein: 6.6 g/dL (ref 6.0–8.3)

## 2020-09-09 LAB — HEMOGLOBIN A1C: Hgb A1c MFr Bld: 5.8 % (ref 4.6–6.5)

## 2020-09-09 LAB — LIPID PANEL
Cholesterol: 180 mg/dL (ref 0–200)
HDL: 58.1 mg/dL (ref >39.00)
LDL Cholesterol: 105 mg/dL — ABNORMAL HIGH (ref 0–99)
NonHDL: 121.67
Total CHOL/HDL Ratio: 3
Triglycerides: 85 mg/dL (ref 0.0–149.0)
VLDL: 17 mg/dL (ref 0.0–40.0)

## 2020-09-17 ENCOUNTER — Ambulatory Visit: Payer: 59 | Admitting: Internal Medicine

## 2020-10-09 ENCOUNTER — Other Ambulatory Visit: Payer: Self-pay

## 2020-10-09 ENCOUNTER — Other Ambulatory Visit: Payer: Self-pay | Admitting: Internal Medicine

## 2020-10-09 MED ORDER — OZEMPIC (1 MG/DOSE) 4 MG/3ML ~~LOC~~ SOPN
1.0000 mg | PEN_INJECTOR | SUBCUTANEOUS | 2 refills | Status: DC
  Filled 2020-10-09: qty 3, 28d supply, fill #0
  Filled 2020-11-04: qty 3, 28d supply, fill #1
  Filled 2020-12-05: qty 3, 28d supply, fill #2

## 2020-10-09 MED ORDER — OZEMPIC (0.25 OR 0.5 MG/DOSE) 2 MG/1.5ML ~~LOC~~ SOPN
0.5000 mg | PEN_INJECTOR | SUBCUTANEOUS | 2 refills | Status: DC
  Filled 2020-10-09: qty 1.5, 28d supply, fill #0

## 2020-10-10 ENCOUNTER — Other Ambulatory Visit: Payer: Self-pay

## 2020-10-10 ENCOUNTER — Encounter: Payer: Self-pay | Admitting: Internal Medicine

## 2020-10-16 ENCOUNTER — Ambulatory Visit: Payer: 59 | Admitting: Internal Medicine

## 2020-10-16 ENCOUNTER — Other Ambulatory Visit: Payer: Self-pay

## 2020-11-04 ENCOUNTER — Other Ambulatory Visit: Payer: Self-pay

## 2020-12-05 ENCOUNTER — Other Ambulatory Visit: Payer: Self-pay

## 2021-01-09 ENCOUNTER — Other Ambulatory Visit: Payer: Self-pay | Admitting: Internal Medicine

## 2021-01-09 ENCOUNTER — Other Ambulatory Visit: Payer: Self-pay

## 2021-01-09 MED ORDER — OZEMPIC (1 MG/DOSE) 4 MG/3ML ~~LOC~~ SOPN
1.0000 mg | PEN_INJECTOR | SUBCUTANEOUS | 2 refills | Status: DC
  Filled 2021-01-09: qty 3, 28d supply, fill #0
  Filled 2021-02-18 (×2): qty 6, 56d supply, fill #1

## 2021-02-06 ENCOUNTER — Other Ambulatory Visit: Payer: Self-pay

## 2021-02-06 ENCOUNTER — Other Ambulatory Visit: Payer: Self-pay | Admitting: Internal Medicine

## 2021-02-06 MED ORDER — ESCITALOPRAM OXALATE 20 MG PO TABS
ORAL_TABLET | Freq: Every day | ORAL | 1 refills | Status: DC
  Filled 2021-02-06: qty 90, 90d supply, fill #0
  Filled 2021-06-02: qty 90, 90d supply, fill #1

## 2021-02-18 ENCOUNTER — Other Ambulatory Visit: Payer: Self-pay

## 2021-02-24 ENCOUNTER — Other Ambulatory Visit: Payer: Self-pay

## 2021-02-24 DIAGNOSIS — E119 Type 2 diabetes mellitus without complications: Secondary | ICD-10-CM | POA: Diagnosis not present

## 2021-02-24 DIAGNOSIS — H2513 Age-related nuclear cataract, bilateral: Secondary | ICD-10-CM | POA: Diagnosis not present

## 2021-02-24 DIAGNOSIS — H43811 Vitreous degeneration, right eye: Secondary | ICD-10-CM | POA: Diagnosis not present

## 2021-02-24 DIAGNOSIS — H4311 Vitreous hemorrhage, right eye: Secondary | ICD-10-CM | POA: Diagnosis not present

## 2021-02-24 MED ORDER — OFLOXACIN 0.3 % OP SOLN
OPHTHALMIC | 5 refills | Status: DC
  Filled 2021-02-24: qty 10, 30d supply, fill #0

## 2021-02-24 MED ORDER — PREDNISOLONE ACETATE 1 % OP SUSP
OPHTHALMIC | 5 refills | Status: DC
  Filled 2021-02-24: qty 5, 18d supply, fill #0

## 2021-02-26 DIAGNOSIS — H4311 Vitreous hemorrhage, right eye: Secondary | ICD-10-CM | POA: Diagnosis not present

## 2021-02-26 DIAGNOSIS — H33311 Horseshoe tear of retina without detachment, right eye: Secondary | ICD-10-CM | POA: Diagnosis not present

## 2021-03-04 ENCOUNTER — Other Ambulatory Visit: Payer: Self-pay

## 2021-03-04 ENCOUNTER — Encounter: Payer: Self-pay | Admitting: Internal Medicine

## 2021-03-04 ENCOUNTER — Telehealth: Payer: 59 | Admitting: Nurse Practitioner

## 2021-03-04 DIAGNOSIS — R059 Cough, unspecified: Secondary | ICD-10-CM

## 2021-03-04 MED ORDER — PREDNISONE 20 MG PO TABS
40.0000 mg | ORAL_TABLET | Freq: Every day | ORAL | 0 refills | Status: AC
  Filled 2021-03-04: qty 10, 5d supply, fill #0

## 2021-03-04 MED ORDER — BENZONATATE 100 MG PO CAPS
100.0000 mg | ORAL_CAPSULE | Freq: Three times a day (TID) | ORAL | 0 refills | Status: DC | PRN
  Filled 2021-03-04: qty 20, 7d supply, fill #0

## 2021-03-04 NOTE — Progress Notes (Signed)
We are sorry that you are not feeling well.  Here is how we plan to help!  Based on your presentation I believe you most likely have A cough due to a virus.  This is called viral bronchitis and is best treated by rest, plenty of fluids and control of the cough.  You may use Ibuprofen or Tylenol as directed to help your symptoms.     In addition you may use A prescription cough medication called Tessalon Perles 100mg . You may take 1-2 capsules every 8 hours as needed for your cough.  Prednisone 20mg  2 po at same time daily for 5 days  From your responses in the eVisit questionnaire you describe inflammation in the upper respiratory tract which is causing a significant cough.  This is commonly called Bronchitis and has four common causes:   Allergies Viral Infections Acid Reflux Bacterial Infection Allergies, viruses and acid reflux are treated by controlling symptoms or eliminating the cause. An example might be a cough caused by taking certain blood pressure medications. You stop the cough by changing the medication. Another example might be a cough caused by acid reflux. Controlling the reflux helps control the cough.  USE OF BRONCHODILATOR ("RESCUE") INHALERS: There is a risk from using your bronchodilator too frequently.  The risk is that over-reliance on a medication which only relaxes the muscles surrounding the breathing tubes can reduce the effectiveness of medications prescribed to reduce swelling and congestion of the tubes themselves.  Although you feel brief relief from the bronchodilator inhaler, your asthma may actually be worsening with the tubes becoming more swollen and filled with mucus.  This can delay other crucial treatments, such as oral steroid medications. If you need to use a bronchodilator inhaler daily, several times per day, you should discuss this with your provider.  There are probably better treatments that could be used to keep your asthma under control.     HOME  CARE Only take medications as instructed by your medical team. Complete the entire course of an antibiotic. Drink plenty of fluids and get plenty of rest. Avoid close contacts especially the very young and the elderly Cover your mouth if you cough or cough into your sleeve. Always remember to wash your hands A steam or ultrasonic humidifier can help congestion.   GET HELP RIGHT AWAY IF: You develop worsening fever. You become short of breath You cough up blood. Your symptoms persist after you have completed your treatment plan MAKE SURE YOU  Understand these instructions. Will watch your condition. Will get help right away if you are not doing well or get worse.    Thank you for choosing an e-visit.  Your e-visit answers were reviewed by a board certified advanced clinical practitioner to complete your personal care plan. Depending upon the condition, your plan could have included both over the counter or prescription medications.  Please review your pharmacy choice. Make sure the pharmacy is open so you can pick up prescription now. If there is a problem, you may contact your provider through [employer] and have the prescription routed to another pharmacy.  Your safety is important to [place]. If you have drug allergies check your prescription carefully.   For the next 24 hours you can use MyChart to ask questions about today's visit, request a non-urgent call back, or ask for a work or school excuse. You will get an email in the next two days asking about your experience. I hope that your e-visit has been  valuable and will speed your recovery.  5-10 minutes spent reviewing and documenting in chart.

## 2021-03-04 NOTE — Telephone Encounter (Signed)
Spoke with pt and she stated that her symptoms started about a week ago with just a head cold that then turned into a dry cough. Pt stated that typically when this happens she develops bronchitis and has to be treated with prednisone and cough medications. Pt stated that the cough has gotten worse and the drainage has a tinge of yellow in it this morning. Pt has no fever, no chills, no body aches. Tested negative for covid via home test. Pt stated that she has been taking mucinex, and otc cough medications with no relief. Since symptoms have been going on for about a week do you want pt to still come by the office for covid and flu and get a chest xray?

## 2021-03-06 DIAGNOSIS — H4311 Vitreous hemorrhage, right eye: Secondary | ICD-10-CM | POA: Diagnosis not present

## 2021-03-09 ENCOUNTER — Other Ambulatory Visit: Payer: Self-pay

## 2021-03-09 ENCOUNTER — Telehealth: Payer: Self-pay | Admitting: Internal Medicine

## 2021-03-09 MED ORDER — AMOXICILLIN-POT CLAVULANATE 875-125 MG PO TABS
1.0000 | ORAL_TABLET | Freq: Two times a day (BID) | ORAL | 0 refills | Status: DC
  Filled 2021-03-09: qty 14, 7d supply, fill #0

## 2021-03-09 MED ORDER — BENZONATATE 200 MG PO CAPS
200.0000 mg | ORAL_CAPSULE | Freq: Three times a day (TID) | ORAL | 1 refills | Status: DC | PRN
  Filled 2021-03-09: qty 60, 20d supply, fill #0

## 2021-03-09 MED ORDER — LEVOFLOXACIN 500 MG PO TABS
500.0000 mg | ORAL_TABLET | Freq: Every day | ORAL | 0 refills | Status: DC
  Filled 2021-03-09: qty 7, 7d supply, fill #0

## 2021-03-09 MED ORDER — PREDNISONE 10 MG PO TABS
ORAL_TABLET | ORAL | 0 refills | Status: DC
  Filled 2021-03-09: qty 21, 6d supply, fill #0

## 2021-03-09 NOTE — Telephone Encounter (Signed)
[facility] pharmacy called and said they are out of Aamoxicillin-clavulanate (AUGMENTIN) 875-125 MG tablet and could Dr Derrel Nip prescribe something else.

## 2021-03-09 NOTE — Telephone Encounter (Signed)
Spoke with pt to let her know that Dr. Derrel Nip has sent in [place] instead. Pt gave a verbal understanding.

## 2021-03-09 NOTE — Telephone Encounter (Signed)
Pt called in this morning stating that she has now developed sinus pain, yellow green from sinuses and coughing up green phlegm. Pt was seen by a different provider via video last week and prescribed prednisone and tessalon pearls. Pt is wanting to know if an antibiotic can be called in. No appts available.

## 2021-03-09 NOTE — Addendum Note (Signed)
Addended by: Crecencio Mc on: 03/09/2021 03:11 PM   Modules accepted: Orders

## 2021-03-09 NOTE — Telephone Encounter (Signed)
Awaiting access nurse note

## 2021-03-09 NOTE — Telephone Encounter (Signed)
Pt called stating she has a sinus infection and pt stated she is still coughing after she has a had a e-visit. Pt thinks she needs antibiotics. Pt stated she has yellowish-greenish coming out of her nose and pain on left side of her face. sent to access nurse

## 2021-03-27 DIAGNOSIS — H33311 Horseshoe tear of retina without detachment, right eye: Secondary | ICD-10-CM | POA: Diagnosis not present

## 2021-03-27 DIAGNOSIS — H31091 Other chorioretinal scars, right eye: Secondary | ICD-10-CM | POA: Diagnosis not present

## 2021-04-02 ENCOUNTER — Other Ambulatory Visit (HOSPITAL_COMMUNITY): Payer: Self-pay

## 2021-04-24 DIAGNOSIS — H31091 Other chorioretinal scars, right eye: Secondary | ICD-10-CM | POA: Diagnosis not present

## 2021-04-28 ENCOUNTER — Other Ambulatory Visit: Payer: Self-pay

## 2021-04-28 MED ORDER — DIAZEPAM 5 MG PO TABS
ORAL_TABLET | ORAL | 0 refills | Status: DC
  Filled 2021-04-28: qty 1, 1d supply, fill #0

## 2021-04-29 ENCOUNTER — Other Ambulatory Visit: Payer: Self-pay

## 2021-04-29 ENCOUNTER — Other Ambulatory Visit: Payer: Self-pay | Admitting: Internal Medicine

## 2021-04-29 MED ORDER — CHLORHEXIDINE GLUCONATE 0.12 % MT SOLN
OROMUCOSAL | 20 refills | Status: DC
  Filled 2021-04-29: qty 473, 16d supply, fill #0

## 2021-04-29 MED ORDER — AMOXICILLIN 875 MG PO TABS
ORAL_TABLET | ORAL | 0 refills | Status: DC
  Filled 2021-04-29: qty 18, 9d supply, fill #0

## 2021-04-29 MED ORDER — IBUPROFEN 600 MG PO TABS
ORAL_TABLET | ORAL | 0 refills | Status: DC
  Filled 2021-04-29: qty 24, 8d supply, fill #0

## 2021-04-29 MED FILL — Metformin HCl Tab 500 MG: ORAL | 90 days supply | Qty: 180 | Fill #0 | Status: AC

## 2021-04-29 MED FILL — Semaglutide Soln Pen-inj 1 MG/DOSE (4 MG/3ML): SUBCUTANEOUS | 84 days supply | Qty: 9 | Fill #0 | Status: AC

## 2021-04-30 ENCOUNTER — Other Ambulatory Visit: Payer: Self-pay

## 2021-04-30 MED ORDER — ACETAMINOPHEN-CODEINE #3 300-30 MG PO TABS
ORAL_TABLET | ORAL | 0 refills | Status: DC
  Filled 2021-04-30: qty 6, 1d supply, fill #0

## 2021-05-08 ENCOUNTER — Other Ambulatory Visit: Payer: Self-pay

## 2021-05-21 DIAGNOSIS — H3589 Other specified retinal disorders: Secondary | ICD-10-CM | POA: Diagnosis not present

## 2021-05-21 DIAGNOSIS — H33312 Horseshoe tear of retina without detachment, left eye: Secondary | ICD-10-CM | POA: Diagnosis not present

## 2021-05-21 DIAGNOSIS — H4312 Vitreous hemorrhage, left eye: Secondary | ICD-10-CM | POA: Diagnosis not present

## 2021-05-21 DIAGNOSIS — H3562 Retinal hemorrhage, left eye: Secondary | ICD-10-CM | POA: Diagnosis not present

## 2021-05-21 DIAGNOSIS — H43812 Vitreous degeneration, left eye: Secondary | ICD-10-CM | POA: Diagnosis not present

## 2021-05-26 ENCOUNTER — Encounter: Payer: Self-pay | Admitting: Internal Medicine

## 2021-05-26 LAB — HM DIABETES EYE EXAM

## 2021-05-27 ENCOUNTER — Other Ambulatory Visit: Payer: Self-pay

## 2021-05-27 DIAGNOSIS — R809 Proteinuria, unspecified: Secondary | ICD-10-CM

## 2021-05-27 DIAGNOSIS — D509 Iron deficiency anemia, unspecified: Secondary | ICD-10-CM

## 2021-05-27 DIAGNOSIS — Z1329 Encounter for screening for other suspected endocrine disorder: Secondary | ICD-10-CM

## 2021-05-27 DIAGNOSIS — E7800 Pure hypercholesterolemia, unspecified: Secondary | ICD-10-CM

## 2021-05-27 DIAGNOSIS — E119 Type 2 diabetes mellitus without complications: Secondary | ICD-10-CM

## 2021-06-02 ENCOUNTER — Other Ambulatory Visit: Payer: Self-pay

## 2021-06-06 ENCOUNTER — Emergency Department (HOSPITAL_COMMUNITY)
Admission: EM | Admit: 2021-06-06 | Discharge: 2021-06-06 | Disposition: A | Discharge status: Home / Self Care | Payer: 59 | Attending: Emergency Medicine | Admitting: Emergency Medicine

## 2021-06-06 ENCOUNTER — Emergency Department (HOSPITAL_COMMUNITY): Payer: 59

## 2021-06-06 ENCOUNTER — Other Ambulatory Visit: Payer: Self-pay

## 2021-06-06 ENCOUNTER — Encounter (HOSPITAL_COMMUNITY): Payer: Self-pay | Admitting: Emergency Medicine

## 2021-06-06 DIAGNOSIS — R0789 Other chest pain: Secondary | ICD-10-CM | POA: Insufficient documentation

## 2021-06-06 DIAGNOSIS — R42 Dizziness and giddiness: Secondary | ICD-10-CM | POA: Insufficient documentation

## 2021-06-06 DIAGNOSIS — S8001XA Contusion of right knee, initial encounter: Secondary | ICD-10-CM | POA: Diagnosis not present

## 2021-06-06 DIAGNOSIS — S8991XA Unspecified injury of right lower leg, initial encounter: Secondary | ICD-10-CM | POA: Insufficient documentation

## 2021-06-06 DIAGNOSIS — M542 Cervicalgia: Secondary | ICD-10-CM | POA: Insufficient documentation

## 2021-06-06 DIAGNOSIS — M25561 Pain in right knee: Secondary | ICD-10-CM

## 2021-06-06 DIAGNOSIS — Z79899 Other long term (current) drug therapy: Secondary | ICD-10-CM | POA: Insufficient documentation

## 2021-06-06 DIAGNOSIS — Y92410 Unspecified street and highway as the place of occurrence of the external cause: Secondary | ICD-10-CM | POA: Insufficient documentation

## 2021-06-06 DIAGNOSIS — Z041 Encounter for examination and observation following transport accident: Secondary | ICD-10-CM | POA: Diagnosis not present

## 2021-06-06 LAB — BASIC METABOLIC PANEL
Anion gap: 7 (ref 5–15)
BUN: 12 mg/dL (ref 6–20)
CO2: 26 mmol/L (ref 22–32)
Calcium: 9.1 mg/dL (ref 8.9–10.3)
Chloride: 106 mmol/L (ref 98–111)
Creatinine, Ser: 0.71 mg/dL (ref 0.44–1.00)
GFR, Estimated: >60 mL/min (ref >60)
Glucose, Bld: 167 mg/dL — ABNORMAL HIGH (ref 70–99)
Potassium: 3.8 mmol/L (ref 3.5–5.1)
Sodium: 139 mmol/L (ref 135–145)

## 2021-06-06 LAB — CBC
HCT: 42.4 % (ref 36.0–46.0)
Hemoglobin: 14.6 g/dL (ref 12.0–15.0)
MCH: 31.1 pg (ref 26.0–34.0)
MCHC: 34.4 g/dL (ref 30.0–36.0)
MCV: 90.4 fL (ref 80.0–100.0)
Platelets: 202 10*3/uL (ref 150–400)
RBC: 4.69 MIL/uL (ref 3.87–5.11)
RDW: 12.4 % (ref 11.5–15.5)
WBC: 5.1 10*3/uL (ref 4.0–10.5)
nRBC: 0 % (ref 0.0–0.2)

## 2021-06-06 LAB — TROPONIN I (HIGH SENSITIVITY): Troponin I (High Sensitivity): 3 ng/L (ref <18)

## 2021-06-06 MED ORDER — METHOCARBAMOL 500 MG PO TABS
750.0000 mg | ORAL_TABLET | Freq: Once | ORAL | Status: AC
Start: 2021-06-06 — End: 2021-06-06
  Administered 2021-06-06: 750 mg via ORAL
  Filled 2021-06-06: qty 2

## 2021-06-06 MED ORDER — LIDOCAINE 5 % EX PTCH
1.0000 | MEDICATED_PATCH | CUTANEOUS | Status: DC
  Administered 2021-06-06: 1 via TRANSDERMAL
  Filled 2021-06-06: qty 1

## 2021-06-06 MED ORDER — ACETAMINOPHEN 500 MG PO TABS
1000.0000 mg | ORAL_TABLET | Freq: Once | ORAL | Status: AC
  Administered 2021-06-06: 1000 mg via ORAL
  Filled 2021-06-06: qty 2

## 2021-06-06 NOTE — ED Notes (Signed)
Pt refused 2nd troponin draw. MD made aware. Pt is up for discharge.  ?

## 2021-06-06 NOTE — Discharge Instructions (Signed)
You were evaluated in the Emergency Department and after careful evaluation, we did not find any emergent condition requiring admission or further testing in the hospital.  Your exam/testing today was overall reassuring.  Please return to the Emergency Department if you experience any worsening of your condition.  Thank you for allowing us to be a part of your care.  
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
## 2021-06-10 ENCOUNTER — Encounter: Payer: Self-pay | Admitting: Internal Medicine

## 2021-06-10 ENCOUNTER — Ambulatory Visit (INDEPENDENT_AMBULATORY_CARE_PROVIDER_SITE_OTHER): Payer: 59 | Admitting: Internal Medicine

## 2021-06-10 ENCOUNTER — Other Ambulatory Visit: Payer: Self-pay

## 2021-06-10 ENCOUNTER — Telehealth: Payer: Self-pay

## 2021-06-10 ENCOUNTER — Ambulatory Visit
Admission: RE | Admit: 2021-06-10 | Discharge: 2021-06-10 | Disposition: A | Discharge status: Home / Self Care | Payer: 59 | Source: Ambulatory Visit | Attending: Internal Medicine | Admitting: Internal Medicine

## 2021-06-10 VITALS — BP 134/72 | HR 77 | Temp 97.4°F | Ht 65.0 in | Wt 179.0 lb

## 2021-06-10 DIAGNOSIS — R0789 Other chest pain: Secondary | ICD-10-CM | POA: Diagnosis not present

## 2021-06-10 DIAGNOSIS — E119 Type 2 diabetes mellitus without complications: Secondary | ICD-10-CM | POA: Diagnosis not present

## 2021-06-10 DIAGNOSIS — R809 Proteinuria, unspecified: Secondary | ICD-10-CM | POA: Diagnosis not present

## 2021-06-10 DIAGNOSIS — Z1231 Encounter for screening mammogram for malignant neoplasm of breast: Secondary | ICD-10-CM | POA: Diagnosis not present

## 2021-06-10 DIAGNOSIS — Z1329 Encounter for screening for other suspected endocrine disorder: Secondary | ICD-10-CM

## 2021-06-10 DIAGNOSIS — E7800 Pure hypercholesterolemia, unspecified: Secondary | ICD-10-CM | POA: Diagnosis not present

## 2021-06-10 DIAGNOSIS — E1129 Type 2 diabetes mellitus with other diabetic kidney complication: Secondary | ICD-10-CM | POA: Diagnosis not present

## 2021-06-10 DIAGNOSIS — D509 Iron deficiency anemia, unspecified: Secondary | ICD-10-CM

## 2021-06-10 DIAGNOSIS — S2222XA Fracture of body of sternum, initial encounter for closed fracture: Secondary | ICD-10-CM | POA: Insufficient documentation

## 2021-06-10 DIAGNOSIS — R0781 Pleurodynia: Secondary | ICD-10-CM | POA: Insufficient documentation

## 2021-06-10 LAB — CBC WITH DIFFERENTIAL/PLATELET
Basophils Absolute: 0 10*3/uL (ref 0.0–0.1)
Basophils Relative: 1 % (ref 0.0–3.0)
Eosinophils Absolute: 0.1 10*3/uL (ref 0.0–0.7)
Eosinophils Relative: 1.8 % (ref 0.0–5.0)
HCT: 42.9 % (ref 36.0–46.0)
Hemoglobin: 14.4 g/dL (ref 12.0–15.0)
Lymphocytes Relative: 26.5 % (ref 12.0–46.0)
Lymphs Abs: 1.3 10*3/uL (ref 0.7–4.0)
MCHC: 33.6 g/dL (ref 30.0–36.0)
MCV: 91 fl (ref 78.0–100.0)
Monocytes Absolute: 0.5 10*3/uL (ref 0.1–1.0)
Monocytes Relative: 9.5 % (ref 3.0–12.0)
Neutro Abs: 3 10*3/uL (ref 1.4–7.7)
Neutrophils Relative %: 61.2 % (ref 43.0–77.0)
Platelets: 207 10*3/uL (ref 150.0–400.0)
RBC: 4.71 Mil/uL (ref 3.87–5.11)
RDW: 13.3 % (ref 11.5–15.5)
WBC: 4.9 10*3/uL (ref 4.0–10.5)

## 2021-06-10 LAB — LIPID PANEL
Cholesterol: 183 mg/dL (ref 0–200)
HDL: 61.8 mg/dL (ref >39.00)
LDL Cholesterol: 99 mg/dL (ref 0–99)
NonHDL: 121.2
Total CHOL/HDL Ratio: 3
Triglycerides: 112 mg/dL (ref 0.0–149.0)
VLDL: 22.4 mg/dL (ref 0.0–40.0)

## 2021-06-10 LAB — COMPREHENSIVE METABOLIC PANEL
ALT: 18 U/L (ref 0–35)
AST: 16 U/L (ref 0–37)
Albumin: 4.4 g/dL (ref 3.5–5.2)
Alkaline Phosphatase: 65 U/L (ref 39–117)
BUN: 14 mg/dL (ref 6–23)
CO2: 28 mEq/L (ref 19–32)
Calcium: 8.9 mg/dL (ref 8.4–10.5)
Chloride: 103 mEq/L (ref 96–112)
Creatinine, Ser: 0.56 mg/dL (ref 0.40–1.20)
GFR: 98.95 mL/min (ref >60.00)
Glucose, Bld: 82 mg/dL (ref 70–99)
Potassium: 4.2 mEq/L (ref 3.5–5.1)
Sodium: 139 mEq/L (ref 135–145)
Total Bilirubin: 0.7 mg/dL (ref 0.2–1.2)
Total Protein: 6.9 g/dL (ref 6.0–8.3)

## 2021-06-10 LAB — MICROALBUMIN / CREATININE URINE RATIO
Creatinine,U: 57.1 mg/dL
Microalb Creat Ratio: 1.2 mg/g (ref 0.0–30.0)
Microalb, Ur: <0.7 mg/dL (ref 0.0–1.9)

## 2021-06-10 LAB — HEMOGLOBIN A1C: Hgb A1c MFr Bld: 5.7 % (ref 4.6–6.5)

## 2021-06-10 LAB — TSH: TSH: 1.85 u[IU]/mL (ref 0.35–5.50)

## 2021-06-10 MED ORDER — TIZANIDINE HCL 4 MG PO TABS
4.0000 mg | ORAL_TABLET | Freq: Four times a day (QID) | ORAL | 0 refills | Status: DC | PRN
  Filled 2021-06-10: qty 30, 8d supply, fill #0

## 2021-06-10 MED ORDER — TRAMADOL HCL 50 MG PO TABS
50.0000 mg | ORAL_TABLET | Freq: Four times a day (QID) | ORAL | 0 refills | Status: AC | PRN
  Filled 2021-06-10: qty 28, 7d supply, fill #0

## 2021-06-10 MED ORDER — IOHEXOL 300 MG/ML  SOLN
75.0000 mL | Freq: Once | INTRAMUSCULAR | Status: AC | PRN
  Administered 2021-06-10: 75 mL via INTRAVENOUS

## 2021-06-10 NOTE — Telephone Encounter (Signed)
Spoke with pt and informed her of her CT scan results. Pt gave a verbal understanding.  °
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
## 2021-06-10 NOTE — Patient Instructions (Addendum)
Tramadol 50 mg every 6 hours as needed for pain not relieved with 1000 mg tylenol and 600 mg motrin (every 6 hours) ? ?STAT chest CT to be done today to rule out sternal and rib fractures ? ? ?Work note for April 10 to 14  ? ? ?
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
## 2021-06-12 ENCOUNTER — Telehealth: Payer: Self-pay | Admitting: Internal Medicine

## 2021-06-12 ENCOUNTER — Other Ambulatory Visit: Payer: Self-pay

## 2021-06-12 DIAGNOSIS — H43392 Other vitreous opacities, left eye: Secondary | ICD-10-CM | POA: Diagnosis not present

## 2021-06-12 DIAGNOSIS — H33312 Horseshoe tear of retina without detachment, left eye: Secondary | ICD-10-CM | POA: Diagnosis not present

## 2021-06-12 DIAGNOSIS — H4312 Vitreous hemorrhage, left eye: Secondary | ICD-10-CM | POA: Diagnosis not present

## 2021-06-12 DIAGNOSIS — H31093 Other chorioretinal scars, bilateral: Secondary | ICD-10-CM | POA: Diagnosis not present

## 2021-06-17 ENCOUNTER — Other Ambulatory Visit: Payer: 59

## 2021-06-19 ENCOUNTER — Ambulatory Visit (INDEPENDENT_AMBULATORY_CARE_PROVIDER_SITE_OTHER): Payer: 59 | Admitting: Internal Medicine

## 2021-06-19 ENCOUNTER — Encounter: Payer: Self-pay | Admitting: Internal Medicine

## 2021-06-19 ENCOUNTER — Other Ambulatory Visit: Payer: Self-pay

## 2021-06-19 VITALS — BP 118/74 | HR 80 | Temp 97.6°F | Ht 65.0 in | Wt 179.0 lb

## 2021-06-19 DIAGNOSIS — F419 Anxiety disorder, unspecified: Secondary | ICD-10-CM

## 2021-06-19 DIAGNOSIS — S2222XD Fracture of body of sternum, subsequent encounter for fracture with routine healing: Secondary | ICD-10-CM

## 2021-06-19 DIAGNOSIS — F324 Major depressive disorder, single episode, in partial remission: Secondary | ICD-10-CM

## 2021-06-19 DIAGNOSIS — F5105 Insomnia due to other mental disorder: Secondary | ICD-10-CM

## 2021-06-19 DIAGNOSIS — E669 Obesity, unspecified: Secondary | ICD-10-CM | POA: Diagnosis not present

## 2021-06-19 DIAGNOSIS — E119 Type 2 diabetes mellitus without complications: Secondary | ICD-10-CM

## 2021-06-19 DIAGNOSIS — Z8781 Personal history of (healed) traumatic fracture: Secondary | ICD-10-CM

## 2021-06-19 DIAGNOSIS — Z23 Encounter for immunization: Secondary | ICD-10-CM | POA: Diagnosis not present

## 2021-06-19 DIAGNOSIS — Z0000 Encounter for general adult medical examination without abnormal findings: Secondary | ICD-10-CM | POA: Diagnosis not present

## 2021-06-19 DIAGNOSIS — D126 Benign neoplasm of colon, unspecified: Secondary | ICD-10-CM | POA: Diagnosis not present

## 2021-06-19 MED ORDER — OZEMPIC (2 MG/DOSE) 8 MG/3ML ~~LOC~~ SOPN
2.0000 mg | PEN_INJECTOR | SUBCUTANEOUS | 0 refills | Status: DC
  Filled 2021-06-19: qty 4.5, 21d supply, fill #0
  Filled 2021-07-17: qty 9, 84d supply, fill #0
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
## 2021-06-19 NOTE — Assessment & Plan Note (Signed)
Currently managed with metformin and Ozempic .   She is requesting AN INCREASE I ozempic dose to 2 mg weekly ? ?Lab Results  ?Component Value Date  ? MICROALBUR <0.7 06/10/2021  ? MICROALBUR <0.7 06/09/2020  ? ? ? ? ?

## 2021-06-19 NOTE — Patient Instructions (Signed)
Repeat chest films (x rays)  should be done at end of May ? ?Postpone mammogram until ribs and sternum are healed  ? ?Increase ozempic dose to 2 mg weekly ? ?Hep c screening with next blood draw (6 months) ? ? ?

## 2021-06-21 NOTE — Assessment & Plan Note (Signed)
Continue supportive care. Repeat films at 6-8 weeks  ?

## 2021-06-21 NOTE — Assessment & Plan Note (Addendum)
She has achieved a 30 lb weight loss using ozempicand  The Optavia diet .  Encouraged to begin an exercise program  ?

## 2021-06-21 NOTE — Assessment & Plan Note (Addendum)

## 2021-06-21 NOTE — Assessment & Plan Note (Signed)
She remains in remission but prefers to continue use of lexapro .  No changes today ?

## 2021-06-21 NOTE — Assessment & Plan Note (Signed)
Managed with low dose alprazolam prn early waking. The risks and benefits of benzodiazepine use were reviewed with patient today including excessive sedation leading to respiratory depression,  impaired thinking/driving, and addiction.  Patient was advised to avoid concurrent use with alcohol, to use medication only as needed and not to share with others  .  ?

## 2021-07-08 ENCOUNTER — Encounter: Payer: Self-pay | Admitting: Internal Medicine

## 2021-07-09 NOTE — Telephone Encounter (Signed)
Appointment scheduled for 08/03/2021 which is first 30-minute appointment available. ?

## 2021-07-10 NOTE — Telephone Encounter (Signed)
Spoke with pt and moved her up to Monday at 4:30 pm.  ?

## 2021-07-13 ENCOUNTER — Ambulatory Visit: Payer: 59 | Admitting: Internal Medicine

## 2021-07-13 ENCOUNTER — Other Ambulatory Visit: Payer: Self-pay

## 2021-07-13 ENCOUNTER — Encounter: Payer: Self-pay | Admitting: Internal Medicine

## 2021-07-13 VITALS — BP 140/88 | HR 71 | Temp 97.3°F | Ht 65.0 in | Wt 178.0 lb

## 2021-07-13 DIAGNOSIS — M25512 Pain in left shoulder: Secondary | ICD-10-CM | POA: Diagnosis not present

## 2021-07-13 DIAGNOSIS — M542 Cervicalgia: Secondary | ICD-10-CM

## 2021-07-13 DIAGNOSIS — F419 Anxiety disorder, unspecified: Secondary | ICD-10-CM | POA: Diagnosis not present

## 2021-07-13 DIAGNOSIS — E119 Type 2 diabetes mellitus without complications: Secondary | ICD-10-CM | POA: Diagnosis not present

## 2021-07-13 MED ORDER — TIZANIDINE HCL 4 MG PO TABS
4.0000 mg | ORAL_TABLET | Freq: Four times a day (QID) | ORAL | 0 refills | Status: DC | PRN
Start: 2021-07-13 — End: 2022-06-08
  Filled 2021-07-13: qty 30, 8d supply, fill #0

## 2021-07-13 MED ORDER — ALPRAZOLAM 0.25 MG PO TABS
ORAL_TABLET | ORAL | 3 refills | Status: DC
  Filled 2021-07-13: qty 30, 30d supply, fill #0

## 2021-07-13 NOTE — Patient Instructions (Addendum)
Return for x rays in one week :  chest ,  cervical spine and left shoulder ? ? ?PT referral  in progress  ?
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
## 2021-07-13 NOTE — Assessment & Plan Note (Signed)
Currently managed with metformin and Ozempic .   She is tolerating the increase in ozempic dose to 2 mg weekly ? ? ?Lab Results  ?Component Value Date  ? HGBA1C 5.7 06/10/2021  ? ? ?Lab Results  ?Component Value Date  ? MICROALBUR <0.7 06/10/2021  ? MICROALBUR <0.7 06/09/2020  ? ? ? ? ?
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
## 2021-07-16 DIAGNOSIS — H4312 Vitreous hemorrhage, left eye: Secondary | ICD-10-CM | POA: Diagnosis not present

## 2021-07-16 DIAGNOSIS — H31093 Other chorioretinal scars, bilateral: Secondary | ICD-10-CM | POA: Diagnosis not present

## 2021-07-16 DIAGNOSIS — H33312 Horseshoe tear of retina without detachment, left eye: Secondary | ICD-10-CM | POA: Diagnosis not present

## 2021-07-16 DIAGNOSIS — H43392 Other vitreous opacities, left eye: Secondary | ICD-10-CM | POA: Diagnosis not present

## 2021-07-17 ENCOUNTER — Other Ambulatory Visit: Payer: Self-pay

## 2021-07-24 ENCOUNTER — Ambulatory Visit (INDEPENDENT_AMBULATORY_CARE_PROVIDER_SITE_OTHER): Payer: 59

## 2021-07-24 ENCOUNTER — Other Ambulatory Visit: Payer: 59

## 2021-07-24 DIAGNOSIS — Z8781 Personal history of (healed) traumatic fracture: Secondary | ICD-10-CM

## 2021-07-24 DIAGNOSIS — M542 Cervicalgia: Secondary | ICD-10-CM

## 2021-07-24 DIAGNOSIS — M25512 Pain in left shoulder: Secondary | ICD-10-CM | POA: Diagnosis not present

## 2021-07-24 DIAGNOSIS — M19012 Primary osteoarthritis, left shoulder: Secondary | ICD-10-CM | POA: Diagnosis not present

## 2021-07-24 DIAGNOSIS — S2222XD Fracture of body of sternum, subsequent encounter for fracture with routine healing: Secondary | ICD-10-CM | POA: Diagnosis not present

## 2021-07-24 DIAGNOSIS — S2220XA Unspecified fracture of sternum, initial encounter for closed fracture: Secondary | ICD-10-CM | POA: Diagnosis not present

## 2021-07-24 DIAGNOSIS — S2232XA Fracture of one rib, left side, initial encounter for closed fracture: Secondary | ICD-10-CM | POA: Diagnosis not present

## 2021-08-03 ENCOUNTER — Ambulatory Visit: Payer: 59 | Admitting: Internal Medicine

## 2021-08-03 ENCOUNTER — Ambulatory Visit
Admission: RE | Admit: 2021-08-03 | Discharge: 2021-08-03 | Disposition: A | Discharge status: Home / Self Care | Payer: 59 | Source: Ambulatory Visit | Attending: Internal Medicine | Admitting: Internal Medicine

## 2021-08-03 DIAGNOSIS — Z1231 Encounter for screening mammogram for malignant neoplasm of breast: Secondary | ICD-10-CM | POA: Diagnosis not present

## 2021-09-17 ENCOUNTER — Encounter: Payer: Self-pay | Admitting: Internal Medicine

## 2021-09-30 ENCOUNTER — Other Ambulatory Visit: Payer: Self-pay

## 2021-09-30 ENCOUNTER — Other Ambulatory Visit: Payer: Self-pay | Admitting: Internal Medicine

## 2021-09-30 MED ORDER — ESCITALOPRAM OXALATE 20 MG PO TABS
ORAL_TABLET | Freq: Every day | ORAL | 1 refills | Status: DC
  Filled 2021-09-30: qty 90, 90d supply, fill #0
  Filled 2022-01-18: qty 90, 90d supply, fill #1

## 2021-10-13 ENCOUNTER — Other Ambulatory Visit: Payer: Self-pay | Admitting: Internal Medicine

## 2021-10-13 ENCOUNTER — Other Ambulatory Visit: Payer: Self-pay

## 2021-10-13 MED FILL — Semaglutide Soln Pen-inj 2 MG/DOSE (8 MG/3ML): SUBCUTANEOUS | 28 days supply | Qty: 3 | Fill #0 | Status: AC

## 2021-11-05 ENCOUNTER — Other Ambulatory Visit: Payer: Self-pay

## 2021-11-05 MED FILL — Semaglutide Soln Pen-inj 2 MG/DOSE (8 MG/3ML): SUBCUTANEOUS | 28 days supply | Qty: 3 | Fill #1 | Status: AC

## 2021-11-24 ENCOUNTER — Telehealth: Payer: Self-pay | Admitting: *Deleted

## 2021-11-24 NOTE — Telephone Encounter (Signed)
Attempted to call pt. X 3 message left with call back # to call back to reschedule pre-visit or upcoming procedure on 12/24/21 will be cancelled.

## 2021-11-24 NOTE — Telephone Encounter (Signed)
No return call received no show letter sent via my chart and mailed and pre-visit and procedure cancelled.

## 2021-11-25 NOTE — Telephone Encounter (Signed)
Patient called and stated that she never received a call for her pre visit and seen where her colonoscopy was canceled. I advised patient that we tried calling 3 times and a left a voicemail and patient stated she had no missed calls and no voicemail. Patient was rescheduled for 10/11 at 1:00 for PV and colon for 11/26 at 11:30.

## 2021-12-09 ENCOUNTER — Ambulatory Visit (AMBULATORY_SURGERY_CENTER): Payer: Self-pay

## 2021-12-09 ENCOUNTER — Telehealth: Payer: Self-pay

## 2021-12-09 ENCOUNTER — Other Ambulatory Visit: Payer: Self-pay

## 2021-12-09 VITALS — Ht 65.0 in | Wt 167.0 lb

## 2021-12-09 DIAGNOSIS — Z86010 Personal history of colonic polyps: Secondary | ICD-10-CM

## 2021-12-09 MED ORDER — NA SULFATE-K SULFATE-MG SULF 17.5-3.13-1.6 GM/177ML PO SOLN
1.0000 | ORAL | 0 refills | Status: DC
  Filled 2021-12-09: qty 354, 1d supply, fill #0

## 2021-12-09 NOTE — Telephone Encounter (Signed)
Voicemail (identified) at both numbers. Messages left at both numbers to please call back before 5pm today to r/s PV. If no call back today, both PV and colonosocopy will be cancelled.

## 2021-12-09 NOTE — Telephone Encounter (Signed)
Returned pt's call to both #'s: voicemail. Requested call back to perform PV over phone or reschedule.

## 2021-12-09 NOTE — Telephone Encounter (Signed)
Voicemail (identified) cell/home at 1pm, again at 1:10pm; voicemail (identified) at work # at 1:10pm. Messages left on both voicemails to please call back for PV or to r/s PV.

## 2021-12-09 NOTE — Telephone Encounter (Signed)
Pt called back stating she never received her PV call. When I mentioned leaving messages on both voicemails identified by name and voice she stated she has no voicemail.  Phone numbers verified with pt. PV conducted over phone.

## 2021-12-09 NOTE — Progress Notes (Signed)
No egg or soy allergy known to patient  No issues known to pt with past sedation with any surgeries or procedures Patient denies ever being told they had issues or difficulty with intubation  No FH of Malignant Hyperthermia Pt is not on diet pills Pt is not on  home 02  Pt is not on blood thinners  Pt denies issues with constipation  No A fib or A flutter Have any cardiac testing pending--denied Pt instructed to use [email or web address] or GoodRx for a price reduction on prep  PV conducted over phone. Instructions for suprep and sample consent mailed to verified address and sent via [place].

## 2021-12-14 ENCOUNTER — Other Ambulatory Visit: Payer: Self-pay

## 2021-12-14 MED FILL — Semaglutide Soln Pen-inj 2 MG/DOSE (8 MG/3ML): SUBCUTANEOUS | 28 days supply | Qty: 3 | Fill #2 | Status: AC

## 2021-12-14 MED FILL — Metformin HCl Tab 500 MG: ORAL | 90 days supply | Qty: 180 | Fill #1 | Status: AC

## 2021-12-16 ENCOUNTER — Encounter: Payer: Self-pay | Admitting: Internal Medicine

## 2021-12-24 ENCOUNTER — Ambulatory Visit (AMBULATORY_SURGERY_CENTER): Payer: 59 | Admitting: Internal Medicine

## 2021-12-24 ENCOUNTER — Encounter: Payer: 59 | Admitting: Internal Medicine

## 2021-12-24 ENCOUNTER — Encounter: Payer: Self-pay | Admitting: Internal Medicine

## 2021-12-24 VITALS — BP 122/71 | HR 80 | Temp 98.0°F | Resp 11 | Ht 65.0 in | Wt 167.0 lb

## 2021-12-24 DIAGNOSIS — Z86010 Personal history of colonic polyps: Secondary | ICD-10-CM

## 2021-12-24 DIAGNOSIS — Z1211 Encounter for screening for malignant neoplasm of colon: Secondary | ICD-10-CM | POA: Diagnosis not present

## 2021-12-24 DIAGNOSIS — Z09 Encounter for follow-up examination after completed treatment for conditions other than malignant neoplasm: Secondary | ICD-10-CM

## 2021-12-24 DIAGNOSIS — E669 Obesity, unspecified: Secondary | ICD-10-CM | POA: Diagnosis not present

## 2021-12-24 DIAGNOSIS — E119 Type 2 diabetes mellitus without complications: Secondary | ICD-10-CM | POA: Diagnosis not present

## 2021-12-24 MED ORDER — SODIUM CHLORIDE 0.9 % IV SOLN: 500.0000 mL | Freq: Once | INTRAVENOUS | Status: DC

## 2021-12-24 NOTE — Progress Notes (Signed)
HISTORY OF PRESENT ILLNESS:  Miranda Huang is a 61 y.o. female with a history of adenomatous colon polyp.  Presents for surveillance colonoscopy.  No complaints  REVIEW OF SYSTEMS:  All non-GI ROS negative. Past Medical History:  Diagnosis Date   Allergy    Anemia    Anxiety    Arthritis    Depression    Diabetes mellitus without complication ([place])    Nonspecific ST-T changes 07/09/2014   Plantar fasciitis, bilateral 08/06/2015   Followed by Dr. Milinda Pointer    Pre-diabetes    Swelling     Past Surgical History:  Procedure Laterality Date   [place]  03/02/2007   DILATION AND CURETTAGE OF UTERUS  03/02/2007   EYE SURGERY     TONSILLECTOMY AND ADENOIDECTOMY      Social History Nastashia GRACIN SOOHOO  reports that she has never smoked. She has never used smokeless tobacco. She reports current alcohol use. She reports that she does not use drugs.  family history includes Alcohol abuse in her father; Anxiety disorder in her father; Brain cancer in her maternal uncle; Breast cancer (age of onset: 27) in her maternal aunt; Cancer in her maternal aunt; Diabetes in her paternal grandmother; Heart disease in her father; Pancreatitis in her father; Sudden death in her father.  Allergies  Allergen Reactions   Codeine Itching    Pt pretreats with Benadryl   Erythromycin Nausea And Vomiting    GI upset       PHYSICAL EXAMINATION: Vital signs: BP 116/77   Pulse 98   Temp 98 F (36.7 C)   Ht '5\' 5"'$  (1.651 m)   Wt 167 lb (75.8 kg)   SpO2 98%   BMI 27.79 kg/m  General: Well-developed, well-nourished, no acute distress HEENT: Sclerae are anicteric, conjunctiva pink. Oral mucosa intact Lungs: Clear Heart: Regular Abdomen: soft, nontender, nondistended, no obvious ascites, no peritoneal signs, normal bowel sounds. No organomegaly. Extremities: No edema Psychiatric: alert and oriented x3. Cooperative      ASSESSMENT:  History of tubular  adenomas   PLAN:   Surveillance colonoscopy

## 2021-12-24 NOTE — Op Note (Signed)
[facility] Patient Name: Miranda Huang Procedure Date: 12/24/2021 11:47 AM MRN: [id number] Endoscopist: Docia Chuck. Henrene Pastor , MD, [id number] Age: 61 Referring MD:  Date of Birth: 09-02-1960 Gender: Female Account #: [id number] Procedure:                Colonoscopy Indications:              High risk colon cancer surveillance: Personal                            history of non-advanced adenoma. Previous                            examinations 2010, 2018 Medicines:                Monitored Anesthesia Care Procedure:                Pre-Anesthesia Assessment:                           - Prior to the procedure, a History and Physical                            was performed, and patient medications and                            allergies were reviewed. The patient's tolerance of                            previous anesthesia was also reviewed. The risks                            and benefits of the procedure and the sedation                            options and risks were discussed with the patient.                            All questions were answered, and informed consent                            was obtained. Prior Anticoagulants: The patient has                            taken no anticoagulant or antiplatelet agents. ASA                            Grade Assessment: II - A patient with mild systemic                            disease. After reviewing the risks and benefits,                            the patient was deemed in satisfactory condition to  undergo the procedure.                           After obtaining informed consent, the colonoscope                            was passed under direct vision. Throughout the                            procedure, the patient's blood pressure, pulse, and                            oxygen saturations were monitored continuously. The                            Olympus Scope [id number] was introduced through  the                            anus and advanced to the the cecum, identified by                            appendiceal orifice and ileocecal valve. The                            ileocecal valve, appendiceal orifice, and rectum                            were photographed. The quality of the bowel                            preparation was excellent. The colonoscopy was                            performed without difficulty. The patient tolerated                            the procedure well. The bowel preparation used was                            SUPREP via split dose instruction. Scope In: 11:55:53 AM Scope Out: 12:09:58 PM Scope Withdrawal Time: 0 hours 11 minutes 15 seconds  Total Procedure Duration: 0 hours 14 minutes 5 seconds  Findings:                 Multiple large-mouthed diverticula were found in                            the entire colon.                           Internal hemorrhoids were found during                            retroflexion. The hemorrhoids were small.  The exam was otherwise without abnormality on                            direct and retroflexion views. Complications:            No immediate complications. Estimated blood loss:                            None. Estimated Blood Loss:     Estimated blood loss: none. Impression:               - Diverticulosis in the entire examined colon.                           - Internal hemorrhoids.                           - The examination was otherwise normal on direct                            and retroflexion views.                           - No specimens collected. Recommendation:           - Repeat colonoscopy in 10 years for surveillance.                           - Patient has a contact number available for                            emergencies. The signs and symptoms of potential                            delayed complications were discussed with the                             patient. Return to normal activities tomorrow.                            Written discharge instructions were provided to the                            patient.                           - Resume previous diet.                           - Continue present medications. Docia Chuck. Henrene Pastor, MD 12/24/2021 12:16:16 PM This report has been signed electronically.

## 2021-12-24 NOTE — Progress Notes (Signed)
Report to PACU, RN, vss, BBS= Clear.  

## 2021-12-24 NOTE — Progress Notes (Signed)
Pt's states no medical or surgical changes since previsit or office visit. 

## 2021-12-24 NOTE — Patient Instructions (Signed)
Handouts provided about diverticulosis and hemorrhoids.  Resume previous diet.  Continue present medications.  Repeat colonoscopy in 10 years for surveillance.   YOU HAD AN ENDOSCOPIC PROCEDURE TODAY AT [facility]:   Refer to the procedure report that was given to you for any specific questions about what was found during the examination.  If the procedure report does not answer your questions, please call your gastroenterologist to clarify.  If you requested that your care partner not be given the details of your procedure findings, then the procedure report has been included in a sealed envelope for you to review at your convenience later.  YOU SHOULD EXPECT: Some feelings of bloating in the abdomen. Passage of more gas than usual.  Walking can help get rid of the air that was put into your GI tract during the procedure and reduce the bloating. If you had a lower endoscopy (such as a colonoscopy or flexible sigmoidoscopy) you may notice spotting of blood in your stool or on the toilet paper. If you underwent a bowel prep for your procedure, you may not have a normal bowel movement for a few days.  Please Note:  You might notice some irritation and congestion in your nose or some drainage.  This is from the oxygen used during your procedure.  There is no need for concern and it should clear up in a day or so.  SYMPTOMS TO REPORT IMMEDIATELY:  Following lower endoscopy (colonoscopy or flexible sigmoidoscopy):  Excessive amounts of blood in the stool  Significant tenderness or worsening of abdominal pains  Swelling of the abdomen that is new, acute  Fever of 100F or higher For urgent or emergent issues, a gastroenterologist can be reached at any hour by calling [phone]. Do not use MyChart messaging for urgent concerns.    DIET:  We do recommend a small meal at first, but then you may proceed to your regular diet.  Drink plenty of fluids but you should avoid alcoholic  beverages for 24 hours.  ACTIVITY:  You should plan to take it easy for the rest of today and you should NOT DRIVE or use heavy machinery until tomorrow (because of the sedation medicines used during the test).    FOLLOW UP: Our staff will call the number listed on your records the next business day following your procedure.  We will call around 7:15- 8:00 am to check on you and address any questions or concerns that you may have regarding the information given to you following your procedure. If we do not reach you, we will leave a message.     If any biopsies were taken you will be contacted by phone or by letter within the next 1-3 weeks.  Please call us at [phone] if you have not heard about the biopsies in 3 weeks.    SIGNATURES/CONFIDENTIALITY: You and/or your care partner have signed paperwork which will be entered into your electronic medical record.  These signatures attest to the fact that that the information above on your After Visit Summary has been reviewed and is understood.  Full responsibility of the confidentiality of this discharge information lies with you and/or your care-partner.

## 2021-12-25 ENCOUNTER — Telehealth: Payer: Self-pay | Admitting: *Deleted

## 2021-12-25 NOTE — Telephone Encounter (Signed)
Post procedure follow up call placed, no answer and left VM.  

## 2022-01-18 ENCOUNTER — Other Ambulatory Visit: Payer: Self-pay | Admitting: Internal Medicine

## 2022-01-18 ENCOUNTER — Other Ambulatory Visit: Payer: Self-pay

## 2022-01-18 MED ORDER — OZEMPIC (2 MG/DOSE) 8 MG/3ML ~~LOC~~ SOPN
2.0000 mg | PEN_INJECTOR | SUBCUTANEOUS | 0 refills | Status: DC
  Filled 2022-01-18: qty 3, 28d supply, fill #0
  Filled 2022-03-02: qty 3, 28d supply, fill #1
  Filled 2022-03-31: qty 3, 28d supply, fill #2

## 2022-02-10 NOTE — Telephone Encounter (Signed)
MyChart messgae sent to patient. 

## 2022-02-16 ENCOUNTER — Telehealth (INDEPENDENT_AMBULATORY_CARE_PROVIDER_SITE_OTHER): Payer: 59 | Admitting: Family

## 2022-02-16 ENCOUNTER — Encounter: Payer: Self-pay | Admitting: Internal Medicine

## 2022-02-16 ENCOUNTER — Other Ambulatory Visit: Payer: Self-pay

## 2022-02-16 ENCOUNTER — Encounter: Payer: Self-pay | Admitting: Family

## 2022-02-16 VITALS — Ht 65.0 in | Wt 167.5 lb

## 2022-02-16 DIAGNOSIS — J0100 Acute maxillary sinusitis, unspecified: Secondary | ICD-10-CM | POA: Diagnosis not present

## 2022-02-16 DIAGNOSIS — J329 Chronic sinusitis, unspecified: Secondary | ICD-10-CM | POA: Insufficient documentation

## 2022-02-16 MED ORDER — AMOXICILLIN-POT CLAVULANATE 875-125 MG PO TABS
1.0000 | ORAL_TABLET | Freq: Two times a day (BID) | ORAL | 0 refills | Status: AC
  Filled 2022-02-16: qty 14, 7d supply, fill #0

## 2022-02-16 NOTE — Patient Instructions (Addendum)
Start augmentin Ensure to take probiotics while on antibiotics and also for 2 weeks after completion. This can either be by eating yogurt daily or taking a probiotic supplement over the counter such as Culturelle.It is important to re-colonize the gut with good bacteria and also to prevent any diarrheal infections associated with antibiotic use.   Sinus Infection, Adult A sinus infection, also called sinusitis, is inflammation of your sinuses. Sinuses are hollow spaces in the bones around your face. Your sinuses are located: Around your eyes. In the middle of your forehead. Behind your nose. In your cheekbones. Mucus normally drains out of your sinuses. When your nasal tissues become inflamed or swollen, mucus can become trapped or blocked. This allows bacteria, viruses, and fungi to grow, which leads to infection. Most infections of the sinuses are caused by a virus. A sinus infection can develop quickly. It can last for up to 4 weeks (acute) or for more than 12 weeks (chronic). A sinus infection often develops after a cold. What are the causes? This condition is caused by anything that creates swelling in the sinuses or stops mucus from draining. This includes: Allergies. Asthma. Infection from bacteria or viruses. Deformities or blockages in your nose or sinuses. Abnormal growths in the nose (nasal polyps). Pollutants, such as chemicals or irritants in the air. Infection from fungi. This is rare. What increases the risk? You are more likely to develop this condition if you: Have a weak body defense system (immune system). Do a lot of swimming or diving. Overuse nasal sprays. Smoke. What are the signs or symptoms? The main symptoms of this condition are pain and a feeling of pressure around the affected sinuses. Other symptoms include: Stuffy nose or congestion that makes it difficult to breathe through your nose. Thick yellow or greenish drainage from your nose. Tenderness, swelling,  and warmth over the affected sinuses. A cough that may get worse at night. Decreased sense of smell and taste. Extra mucus that collects in the throat or the back of the nose (postnasal drip) causing a sore throat or bad breath. Tiredness (fatigue). Fever. How is this diagnosed? This condition is diagnosed based on: Your symptoms. Your medical history. A physical exam. Tests to find out if your condition is acute or chronic. This may include: Checking your nose for nasal polyps. Viewing your sinuses using a device that has a light (endoscope). Testing for allergies or bacteria. Imaging tests, such as an MRI or CT scan. In rare cases, a bone biopsy may be done to rule out more serious types of fungal sinus disease. How is this treated? Treatment for a sinus infection depends on the cause and whether your condition is chronic or acute. If caused by a virus, your symptoms should go away on their own within 10 days. You may be given medicines to relieve symptoms. They include: Medicines that shrink swollen nasal passages (decongestants). A spray that eases inflammation of the nostrils (topical intranasal corticosteroids). Rinses that help get rid of thick mucus in your nose (nasal saline washes). Medicines that treat allergies (antihistamines). Over-the-counter pain relievers. If caused by bacteria, your health care provider may recommend waiting to see if your symptoms improve. Most bacterial infections will get better without antibiotic medicine. You may be given antibiotics if you have: A severe infection. A weak immune system. If caused by narrow nasal passages or nasal polyps, surgery may be needed. Follow these instructions at home: Medicines Take, use, or apply over-the-counter and prescription medicines only as  told by your health care provider. These may include nasal sprays. If you were prescribed an antibiotic medicine, take it as told by your health care provider. Do not stop  taking the antibiotic even if you start to feel better. Hydrate and humidify  Drink enough fluid to keep your urine pale yellow. Staying hydrated will help to thin your mucus. Use a cool mist humidifier to keep the humidity level in your home above 50%. Inhale steam for 10-15 minutes, 3-4 times a day, or as told by your health care provider. You can do this in the bathroom while a hot shower is running. Limit your exposure to cool or dry air. Rest Rest as much as possible. Sleep with your head raised (elevated). Make sure you get enough sleep each night. General instructions  Apply a warm, moist washcloth to your face 3-4 times a day or as told by your health care provider. This will help with discomfort. Use nasal saline washes as often as told by your health care provider. Wash your hands often with soap and water to reduce your exposure to germs. If soap and water are not available, use hand sanitizer. Do not smoke. Avoid being around people who are smoking (secondhand smoke). Keep all follow-up visits. This is important. Contact a health care provider if: You have a fever. Your symptoms get worse. Your symptoms do not improve within 10 days. Get help right away if: You have a severe headache. You have persistent vomiting. You have severe pain or swelling around your face or eyes. You have vision problems. You develop confusion. Your neck is stiff. You have trouble breathing. These symptoms may be an emergency. Get help right away. Call 911. Do not wait to see if the symptoms will go away. Do not drive yourself to the hospital. Summary A sinus infection is soreness and inflammation of your sinuses. Sinuses are hollow spaces in the bones around your face. This condition is caused by nasal tissues that become inflamed or swollen. The swelling traps or blocks the flow of mucus. This allows bacteria, viruses, and fungi to grow, which leads to infection. If you were prescribed an  antibiotic medicine, take it as told by your health care provider. Do not stop taking the antibiotic even if you start to feel better. Keep all follow-up visits. This is important. This information is not intended to replace advice given to you by your health care provider. Make sure you discuss any questions you have with your health care provider. Document Revised: 01/20/2021 Document Reviewed: 01/20/2021 Elsevier Patient Education  [place].

## 2022-02-16 NOTE — Progress Notes (Signed)
Virtual Visit via Video Note  I connected with Miranda Huang on 02/16/22 at 11:30 AM EST by a video enabled telemedicine application and verified that I am speaking with the correct person using two identifiers. Location patient: home Location provider: work  Persons participating in the virtual visit: patient, provider  I discussed the limitations of evaluation and management by telemedicine and the availability of in person appointments. The patient expressed understanding and agreed to proceed.  HPI: She complains of congestion x 3 weeks, waxed and waned.   She had fever, bodyaches with resolution in 3 days time.   Complains of increase of green nasal congestion, lingering.   She is afebrile. Left sided facial pressure.   Slight HA. Denies vision changes.   Occasional dry cough which has improved.   Taking tesslon perles, and delysm. Delsum is more helpful   No sob, wheezing She has used albuterol in the past.  Negative covid test at home taken 3 weeks ago.  No recent antibiotics ROS: See pertinent positives and negatives per HPI.  EXAM:  VITALS per patient if applicable: Ht '5\' 5"'$  (1.651 m)   Wt 167 lb 8 oz (76 kg)   BMI 27.87 kg/m  BP Readings from Last 3 Encounters:  12/24/21 122/71  07/13/21 140/88  06/19/21 118/74   Wt Readings from Last 3 Encounters:  02/16/22 167 lb 8 oz (76 kg)  12/24/21 167 lb (75.8 kg)  12/09/21 167 lb (75.8 kg)    GENERAL: alert, oriented, appears well and in no acute distress  HEENT: atraumatic, conjunttiva clear, no obvious abnormalities on inspection of external nose and ears  NECK: normal movements of the head and neck  LUNGS: on inspection no signs of respiratory distress, breathing rate appears normal, no obvious gross SOB, gasping or wheezing  CV: no obvious cyanosis  MS: moves all visible extremities without noticeable abnormality  PSYCH/NEURO: pleasant and cooperative, no obvious depression or anxiety, speech and  thought processing grossly intact  ASSESSMENT AND PLAN: Acute non-recurrent maxillary sinusitis Assessment & Plan: Duration 3 weeks.  Fever has resolved.  Concern for bacterial sinusitis.  Start Augmentin.  Patient will start probiotics and let me know how she is doing  Orders: -     Amoxicillin-Pot Clavulanate; Take 1 tablet by mouth 2 (two) times daily for 7 days.  Dispense: 14 tablet; Refill: 0     -we discussed possible serious and likely etiologies, options for evaluation and workup, limitations of telemedicine visit vs in person visit, treatment, treatment risks and precautions. Pt prefers to treat via telemedicine empirically rather then risking or undertaking an in person visit at this moment.    I discussed the assessment and treatment plan with the patient. The patient was provided an opportunity to ask questions and all were answered. The patient agreed with the plan and demonstrated an understanding of the instructions.   The patient was advised to call back or seek an in-person evaluation if the symptoms worsen or if the condition fails to improve as anticipated.  Advised if desired AVS can be mailed or viewed via [place] if [place] user.   [name], FNP

## 2022-02-16 NOTE — Progress Notes (Deleted)
   Assessment & Plan:  There are no diagnoses linked to this encounter.   Return precautions given.   Risks, benefits, and alternatives of the medications and treatment plan prescribed today were discussed, and patient expressed understanding.   Education regarding symptom management and diagnosis given to patient on AVS either electronically or printed.  No follow-ups on file.  Miranda Paris, FNP  Subjective:    Patient ID: Miranda Huang, female    DOB: 1960-12-19, 61 y.o.   MRN: [id number]  CC: Miranda Huang is a 61 y.o. female who presents today for an acute visit.    HPI: She complains of congesstion x 3 weeks She had fever, bodyaches with resolution in 3 days time.   Complains of increase green nasal congestion, lingering.  She is afebrile. Left sided less pressure.   Slight HA. NO vision changes.   Occasional dry cough which has improved.  Taking tesslon perles, and delysm. Delsum is more helpful   No sob, wheezing She has used albuterol in the past.  Negative covid 3 weeks.     She is not vaccinated for COVID  Allergies: Codeine and Erythromycin Current Outpatient Medications on File Prior to Visit  Medication Sig Dispense Refill   ALPRAZolam (XANAX) 0.25 MG tablet TAKE ONE TABLET BY MOUTH AT BEDTIME AS NEEDED FOR ANXIETY 30 tablet 3   escitalopram (LEXAPRO) 20 MG tablet TAKE 1 TABLET BY MOUTH DAILY 90 tablet 1   escitalopram (LEXAPRO) 20 MG tablet Take by mouth.     metFORMIN (GLUCOPHAGE) 500 MG tablet TAKE 1 TABLET BY MOUTH 2 TIMES DAILY WITH A MEAL. 180 tablet 3   Semaglutide, 2 MG/DOSE, (OZEMPIC, 2 MG/DOSE,) 8 MG/3ML SOPN Inject 2 mg into the skin once a week. 9 mL 0   sucralfate (CARAFATE) 1 g tablet TAKE 1 TABLET BY MOUTH 4 TIMES DAILY - WITH MEALS AND AT BEDTIME. 120 tablet 1   Accu-Chek FastClix Lancets MISC USE AS DIRECTED TWICE DAILY AND AS NEEDED (Patient not taking: Reported on 02/16/2022) 102 each 2   ACCU-CHEK GUIDE test strip USE AS DIRECTED  TWICE DAILY AND AS NEEDED (Patient not taking: Reported on 02/16/2022) 100 strip 12   Na Sulfate-K Sulfate-Mg Sulf 17.5-3.13-1.6 GM/177ML SOLN Take 1 kit by mouth as directed. May use generic Suprep, no prior authorization. Take as directed. (Patient not taking: Reported on 02/16/2022) 354 mL 0   tiZANidine (ZANAFLEX) 4 MG tablet Take 1 tablet (4 mg total) by mouth every 6 (six) hours as needed for muscle spasms. (Patient not taking: Reported on 02/16/2022) 30 tablet 0   No current facility-administered medications on file prior to visit.    Review of Systems    Objective:    Ht _0  (1.651 m)   Wt 167 lb 8 oz (76 kg)   BMI 27.87 kg/m   BP Readings from Last 3 Encounters:  12/24/21 122/71  07/13/21 140/88  06/19/21 118/74   Wt Readings from Last 3 Encounters:  02/16/22 167 lb 8 oz (76 kg)  12/24/21 167 lb (75.8 kg)  12/09/21 167 lb (75.8 kg)    Physical Exam

## 2022-02-16 NOTE — Assessment & Plan Note (Signed)
Duration 3 weeks.  Fever has resolved.  Concern for bacterial sinusitis.  Start Augmentin.  Patient will start probiotics and let me know how she is doing

## 2022-02-16 NOTE — Telephone Encounter (Signed)
Spoke with pt and she has been scheduled with Joycelyn Schmid, NP today at 11:30 am.

## 2022-03-02 ENCOUNTER — Other Ambulatory Visit: Payer: Self-pay

## 2022-05-05 DIAGNOSIS — H5213 Myopia, bilateral: Secondary | ICD-10-CM | POA: Diagnosis not present

## 2022-05-05 LAB — HM DIABETES EYE EXAM

## 2022-05-11 ENCOUNTER — Other Ambulatory Visit: Payer: Self-pay

## 2022-05-11 ENCOUNTER — Telehealth (INDEPENDENT_AMBULATORY_CARE_PROVIDER_SITE_OTHER): Payer: 59 | Admitting: Family

## 2022-05-11 ENCOUNTER — Encounter: Payer: Self-pay | Admitting: Family

## 2022-05-11 DIAGNOSIS — J0100 Acute maxillary sinusitis, unspecified: Secondary | ICD-10-CM

## 2022-05-11 MED ORDER — DOXYCYCLINE HYCLATE 100 MG PO TABS
100.0000 mg | ORAL_TABLET | Freq: Two times a day (BID) | ORAL | 0 refills | Status: AC
  Filled 2022-05-11: qty 14, 7d supply, fill #0

## 2022-05-11 NOTE — Patient Instructions (Addendum)
I think it is reasonable to give Mucinex more time as I suspect more likely symptoms are viral in nature.  Of course if symptoms were to persist or worsen you may start doxycycline ( antibiotic)  Ensure to take probiotics while on antibiotics and also for 2 weeks after completion. This can either be by eating yogurt daily or taking a probiotic supplement over the counter such as Culturelle.It is important to re-colonize the gut with good bacteria and also to prevent any diarrheal infections associated with antibiotic use.

## 2022-05-11 NOTE — Assessment & Plan Note (Addendum)
Patient afebrile.  Discussed productive cough, nasal congestion.  Counseled patient that I suspect symptoms are more viral in nature.  We discussed trial of prednisone however agreed to defer in setting of DM. We discussed getting Mucinex more time and if no improvement, she will start doxycycline. She will let me know how she is doing.

## 2022-05-11 NOTE — Progress Notes (Signed)
Virtual Visit via Video Note  I connected with Miranda Huang on 05/11/22 at 11:30 AM EDT by a video enabled telemedicine application and verified that I am speaking with the correct person using two identifiers. Location patient: home Location provider: work  Persons participating in the virtual visit: patient, provider  I discussed the limitations of evaluation and management by telemedicine and the availability of in person appointments. The patient expressed understanding and agreed to proceed.  HPI: Acute visit Complains of productive cough, nasal congestion x 6 days. Cough is worse in the morning.  Nasal congestion has worsened. This morning, right ear feels pressure.   She has been taking Mucinex and Tessalon Perles with mixed results. She has been taking zyrtec.   No  fever, bodyaches, wheezing.   She was treated with augmentin 01/2022.   History of diabetes, seasonal allergies  Non smoker ROS: See pertinent positives and negatives per HPI.  EXAM:  VITALS per patient if applicable: There were no vitals taken for this visit. BP Readings from Last 3 Encounters:  12/24/21 122/71  07/13/21 140/88  06/19/21 118/74   Wt Readings from Last 3 Encounters:  02/16/22 167 lb 8 oz (76 kg)  12/24/21 167 lb (75.8 kg)  12/09/21 167 lb (75.8 kg)    GENERAL: alert, oriented, appears well and in no acute distress  HEENT: atraumatic, conjunttiva clear, no obvious abnormalities on inspection of external nose and ears  NECK: normal movements of the head and neck  LUNGS: on inspection no signs of respiratory distress, breathing rate appears normal, no obvious gross SOB, gasping or wheezing  CV: no obvious cyanosis  MS: moves all visible extremities without noticeable abnormality  PSYCH/NEURO: pleasant and cooperative, no obvious depression or anxiety, speech and thought processing grossly intact  ASSESSMENT AND PLAN: Acute non-recurrent maxillary sinusitis Assessment &  Plan: Patient afebrile.  Discussed productive cough, nasal congestion.  Counseled patient that I suspect symptoms are more viral in nature.  We discussed trial of prednisone however agreed to defer in setting of DM. We discussed getting Mucinex more time and if no improvement, she will start doxycycline. She will let me know how she is doing.   Orders: -     Doxycycline Hyclate; Take 1 tablet (100 mg total) by mouth 2 (two) times daily for 7 days.  Dispense: 14 tablet; Refill: 0     -we discussed possible serious and likely etiologies, options for evaluation and workup, limitations of telemedicine visit vs in person visit, treatment, treatment risks and precautions. Pt prefers to treat via telemedicine empirically rather then risking or undertaking an in person visit at this moment.    I discussed the assessment and treatment plan with the patient. The patient was provided an opportunity to ask questions and all were answered. The patient agreed with the plan and demonstrated an understanding of the instructions.   The patient was advised to call back or seek an in-person evaluation if the symptoms worsen or if the condition fails to improve as anticipated.  Advised if desired AVS can be mailed or viewed via [place] if Horace user.   [name], FNP

## 2022-05-12 ENCOUNTER — Other Ambulatory Visit: Payer: Self-pay | Admitting: Internal Medicine

## 2022-05-12 ENCOUNTER — Other Ambulatory Visit: Payer: Self-pay | Admitting: Family

## 2022-05-13 ENCOUNTER — Other Ambulatory Visit: Payer: Self-pay | Admitting: Internal Medicine

## 2022-05-13 ENCOUNTER — Other Ambulatory Visit: Payer: Self-pay

## 2022-05-13 MED ORDER — METFORMIN HCL 500 MG PO TABS
500.0000 mg | ORAL_TABLET | Freq: Two times a day (BID) | ORAL | 3 refills | Status: DC
  Filled 2022-05-13: qty 180, 90d supply, fill #0

## 2022-05-13 MED ORDER — OZEMPIC (2 MG/DOSE) 8 MG/3ML ~~LOC~~ SOPN
2.0000 mg | PEN_INJECTOR | SUBCUTANEOUS | 0 refills | Status: DC
  Filled 2022-05-13: qty 3, 28d supply, fill #0

## 2022-05-13 MED FILL — Escitalopram Oxalate Tab 20 MG (Base Equiv): ORAL | 90 days supply | Qty: 90 | Fill #0 | Status: AC

## 2022-05-28 ENCOUNTER — Other Ambulatory Visit: Payer: Self-pay

## 2022-05-31 ENCOUNTER — Encounter: Payer: Self-pay | Admitting: Internal Medicine

## 2022-05-31 DIAGNOSIS — D509 Iron deficiency anemia, unspecified: Secondary | ICD-10-CM

## 2022-05-31 DIAGNOSIS — E119 Type 2 diabetes mellitus without complications: Secondary | ICD-10-CM

## 2022-05-31 DIAGNOSIS — R5383 Other fatigue: Secondary | ICD-10-CM

## 2022-05-31 DIAGNOSIS — E7800 Pure hypercholesterolemia, unspecified: Secondary | ICD-10-CM

## 2022-06-02 ENCOUNTER — Other Ambulatory Visit (INDEPENDENT_AMBULATORY_CARE_PROVIDER_SITE_OTHER): Payer: 59

## 2022-06-02 DIAGNOSIS — D509 Iron deficiency anemia, unspecified: Secondary | ICD-10-CM

## 2022-06-02 DIAGNOSIS — E7800 Pure hypercholesterolemia, unspecified: Secondary | ICD-10-CM | POA: Diagnosis not present

## 2022-06-02 DIAGNOSIS — E119 Type 2 diabetes mellitus without complications: Secondary | ICD-10-CM

## 2022-06-02 DIAGNOSIS — R5383 Other fatigue: Secondary | ICD-10-CM | POA: Diagnosis not present

## 2022-06-02 LAB — COMPREHENSIVE METABOLIC PANEL
ALT: 17 U/L (ref 0–35)
AST: 15 U/L (ref 0–37)
Albumin: 4.3 g/dL (ref 3.5–5.2)
Alkaline Phosphatase: 68 U/L (ref 39–117)
BUN: 12 mg/dL (ref 6–23)
CO2: 28 mEq/L (ref 19–32)
Calcium: 9 mg/dL (ref 8.4–10.5)
Chloride: 104 mEq/L (ref 96–112)
Creatinine, Ser: 0.57 mg/dL (ref 0.40–1.20)
GFR: 97.86 mL/min (ref >60.00)
Glucose, Bld: 88 mg/dL (ref 70–99)
Potassium: 4.1 mEq/L (ref 3.5–5.1)
Sodium: 139 mEq/L (ref 135–145)
Total Bilirubin: 0.4 mg/dL (ref 0.2–1.2)
Total Protein: 6.4 g/dL (ref 6.0–8.3)

## 2022-06-02 LAB — CBC WITH DIFFERENTIAL/PLATELET
Basophils Absolute: 0.1 10*3/uL (ref 0.0–0.1)
Basophils Relative: 1.2 % (ref 0.0–3.0)
Eosinophils Absolute: 0.3 10*3/uL (ref 0.0–0.7)
Eosinophils Relative: 6 % — ABNORMAL HIGH (ref 0.0–5.0)
HCT: 41.8 % (ref 36.0–46.0)
Hemoglobin: 14.3 g/dL (ref 12.0–15.0)
Lymphocytes Relative: 28.8 % (ref 12.0–46.0)
Lymphs Abs: 1.2 10*3/uL (ref 0.7–4.0)
MCHC: 34.3 g/dL (ref 30.0–36.0)
MCV: 90 fl (ref 78.0–100.0)
Monocytes Absolute: 0.4 10*3/uL (ref 0.1–1.0)
Monocytes Relative: 8.7 % (ref 3.0–12.0)
Neutro Abs: 2.3 10*3/uL (ref 1.4–7.7)
Neutrophils Relative %: 55.3 % (ref 43.0–77.0)
Platelets: 201 10*3/uL (ref 150.0–400.0)
RBC: 4.65 Mil/uL (ref 3.87–5.11)
RDW: 13.1 % (ref 11.5–15.5)
WBC: 4.2 10*3/uL (ref 4.0–10.5)

## 2022-06-02 LAB — MICROALBUMIN / CREATININE URINE RATIO
Creatinine,U: 48.9 mg/dL
Microalb Creat Ratio: 1.4 mg/g (ref 0.0–30.0)
Microalb, Ur: <0.7 mg/dL (ref 0.0–1.9)

## 2022-06-02 LAB — TSH: TSH: 1.84 u[IU]/mL (ref 0.35–5.50)

## 2022-06-02 LAB — LIPID PANEL
Cholesterol: 201 mg/dL — ABNORMAL HIGH (ref 0–200)
HDL: 64.4 mg/dL (ref >39.00)
LDL Cholesterol: 121 mg/dL — ABNORMAL HIGH (ref 0–99)
NonHDL: 136.24
Total CHOL/HDL Ratio: 3
Triglycerides: 74 mg/dL (ref 0.0–149.0)
VLDL: 14.8 mg/dL (ref 0.0–40.0)

## 2022-06-02 LAB — LDL CHOLESTEROL, DIRECT: Direct LDL: 121 mg/dL

## 2022-06-02 LAB — HEMOGLOBIN A1C: Hgb A1c MFr Bld: 5.4 % (ref 4.6–6.5)

## 2022-06-08 ENCOUNTER — Ambulatory Visit (INDEPENDENT_AMBULATORY_CARE_PROVIDER_SITE_OTHER): Payer: 59 | Admitting: Internal Medicine

## 2022-06-08 ENCOUNTER — Other Ambulatory Visit: Payer: Self-pay

## 2022-06-08 VITALS — BP 130/78 | HR 65 | Temp 97.6°F | Ht 65.0 in | Wt 170.0 lb

## 2022-06-08 DIAGNOSIS — E7800 Pure hypercholesterolemia, unspecified: Secondary | ICD-10-CM

## 2022-06-08 DIAGNOSIS — E119 Type 2 diabetes mellitus without complications: Secondary | ICD-10-CM

## 2022-06-08 DIAGNOSIS — K760 Fatty (change of) liver, not elsewhere classified: Secondary | ICD-10-CM

## 2022-06-08 DIAGNOSIS — F324 Major depressive disorder, single episode, in partial remission: Secondary | ICD-10-CM

## 2022-06-08 DIAGNOSIS — I700 Atherosclerosis of aorta: Secondary | ICD-10-CM | POA: Diagnosis not present

## 2022-06-08 MED ORDER — OMEPRAZOLE 40 MG PO CPDR
40.0000 mg | DELAYED_RELEASE_CAPSULE | Freq: Every day | ORAL | 3 refills | Status: AC
  Filled 2022-06-08: qty 30, 30d supply, fill #0
  Filled 2022-08-06: qty 30, 30d supply, fill #1
  Filled 2022-12-10: qty 30, 30d supply, fill #2
  Filled 2023-03-23: qty 30, 30d supply, fill #3

## 2022-06-08 MED ORDER — ROSUVASTATIN CALCIUM 10 MG PO TABS
10.0000 mg | ORAL_TABLET | Freq: Every day | ORAL | 1 refills | Status: DC
  Filled 2022-06-08: qty 79, 79d supply, fill #0
  Filled 2022-06-08: qty 11, 11d supply, fill #0
  Filled 2022-09-06: qty 90, 90d supply, fill #1

## 2022-06-08 MED ORDER — OZEMPIC (2 MG/DOSE) 8 MG/3ML ~~LOC~~ SOPN
2.0000 mg | PEN_INJECTOR | SUBCUTANEOUS | 2 refills | Status: DC
  Filled 2022-06-08: qty 3, 28d supply, fill #0
  Filled 2022-08-25: qty 3, 28d supply, fill #1
  Filled 2022-10-21: qty 3, 28d supply, fill #2
  Filled 2022-12-10: qty 3, 28d supply, fill #3
  Filled 2023-01-14: qty 3, 28d supply, fill #4
  Filled 2023-02-21: qty 3, 28d supply, fill #5

## 2022-06-08 NOTE — Assessment & Plan Note (Signed)
Continue lexapro as she is anticipating some stressful life transitions in the near future

## 2022-06-08 NOTE — Assessment & Plan Note (Signed)
Noted on April 2023 trauma CT.

## 2022-06-08 NOTE — Progress Notes (Signed)
Subjective:  Patient ID: Miranda Huang, female    DOB: [birth year]  Age: 62 y.o. MRN: [id number]  CC: The primary encounter diagnosis was Atherosclerosis of aorta. Diagnoses of Major depressive disorder with single episode, in partial remission, Type 2 diabetes mellitus without complication, without long-term current use of insulin, Hepatic steatosis, and Hypercholesteremia were also pertinent to this visit.   HPI Miranda Huang presents for  Chief Complaint  Patient presents with   Medical Management of Chronic Issues    1) obesity with type 2 DM:  last seen 11 months ago . Has been taking max dose of ozempic since May 2023  she  Started ozempic at a weight of 206  now 170 lbs.  Wants to lose more.  Not exercising regularly .   on 2 mg weekly dose   2) aortic atherosclerosis:Reviewed findings of prior CTdone April 2023  today..  Patient is willing to  Initiate statin therapy starting with 10 mg crestor fdaily and rtc for labs in 3 weeks    3) nocturnal (during sleep ) heartburn ;  resolved years ago  with omeprazole ,  weight loss,  has returned . 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  Happy in new job.  Taking lexapro.  Husband retiring soon.    Outpatient Medications Prior to Visit  Medication Sig Dispense Refill   Accu-Chek FastClix Lancets MISC USE AS DIRECTED TWICE DAILY AND AS NEEDED 102 each 2   ACCU-CHEK GUIDE test strip USE AS DIRECTED TWICE DAILY AND AS NEEDED 100 strip 12   ALPRAZolam (XANAX) 0.25 MG tablet TAKE ONE TABLET BY MOUTH AT BEDTIME AS NEEDED FOR ANXIETY 30 tablet 3   escitalopram (LEXAPRO) 20 MG tablet TAKE 1 TABLET BY MOUTH DAILY 90 tablet 1   metFORMIN (GLUCOPHAGE) 500 MG tablet Take 1 tablet (500 mg total) by mouth 2 (two) times daily with a meal. 180 tablet 3   sucralfate (CARAFATE) 1 g tablet TAKE 1 TABLET BY MOUTH 4 TIMES DAILY - WITH MEALS AND AT BEDTIME. 120 tablet 1   Semaglutide, 2  MG/DOSE, (OZEMPIC, 2 MG/DOSE,) 8 MG/3ML SOPN Inject 2 mg into the skin once a week. 9 mL 0   escitalopram (LEXAPRO) 20 MG tablet TAKE 1 TABLET BY MOUTH DAILY 90 tablet 1   escitalopram (LEXAPRO) 20 MG tablet Take by mouth.     Na Sulfate-K Sulfate-Mg Sulf 17.5-3.13-1.6 GM/177ML SOLN Take 1 kit by mouth as directed. May use generic Suprep, no prior authorization. Take as directed. (Patient not taking: Reported on 02/16/2022) 354 mL 0   tiZANidine (ZANAFLEX) 4 MG tablet Take 1 tablet (4 mg total) by mouth every 6 (six) hours as needed for muscle spasms. (Patient not taking: Reported on 06/08/2022) 30 tablet 0   No facility-administered medications prior to visit.    Review of Systems;  Patient denies headache, fevers, malaise, unintentional weight loss, skin rash, eye pain, sinus congestion and sinus pain, sore throat, dysphagia,  hemoptysis , cough, dyspnea, wheezing, chest pain, palpitations, orthopnea, edema, abdominal pain, nausea, melena, diarrhea, constipation, flank pain, dysuria, hematuria, urinary  Frequency, nocturia, numbness, tingling, seizures,  Focal weakness, Loss of consciousness,  Tremor, insomnia, depression, anxiety, and suicidal ideation.      Objective:  BP 130/78   Pulse 65   Temp 97.6 F (36.4 C) (Oral)   Ht 5\' 5"  (1.651 m)   Wt 170 lb (  77.1 kg)   SpO2 99%   BMI 28.29 kg/m   BP Readings from Last 3 Encounters:  06/08/22 130/78  12/24/21 122/71  07/13/21 140/88    Wt Readings from Last 3 Encounters:  06/08/22 170 lb (77.1 kg)  02/16/22 167 lb 8 oz (76 kg)  12/24/21 167 lb (75.8 kg)    Physical Exam Vitals reviewed.  Constitutional:      General: She is not in acute distress.    Appearance: Normal appearance. She is normal weight. She is not ill-appearing, toxic-appearing or diaphoretic.  HENT:     Head: Normocephalic.  Eyes:     General: No scleral icterus.       Right eye: No discharge.        Left eye: No discharge.     Conjunctiva/sclera:  Conjunctivae normal.  Cardiovascular:     Rate and Rhythm: Normal rate and regular rhythm.     Heart sounds: Normal heart sounds.  Pulmonary:     Effort: Pulmonary effort is normal. No respiratory distress.     Breath sounds: Normal breath sounds.  Musculoskeletal:        General: Normal range of motion.  Skin:    General: Skin is warm and dry.  Neurological:     General: No focal deficit present.     Mental Status: She is alert and oriented to person, place, and time. Mental status is at baseline.  Psychiatric:        Mood and Affect: Mood normal.        Behavior: Behavior normal.        Thought Content: Thought content normal.        Judgment: Judgment normal.    Lab Results  Component Value Date   HGBA1C 5.4 06/02/2022   HGBA1C 5.7 06/10/2021   HGBA1C 5.8 09/09/2020    Lab Results  Component Value Date   CREATININE 0.57 06/02/2022   CREATININE 0.56 06/10/2021   CREATININE 0.71 06/06/2021    Lab Results  Component Value Date   WBC 4.2 06/02/2022   HGB 14.3 06/02/2022   HCT 41.8 06/02/2022   PLT 201.0 06/02/2022   GLUCOSE 88 06/02/2022   CHOL 201 (H) 06/02/2022   TRIG 74.0 06/02/2022   HDL 64.40 06/02/2022   LDLDIRECT 121.0 06/02/2022   LDLCALC 121 (H) 06/02/2022   ALT 17 06/02/2022   AST 15 06/02/2022   NA 139 06/02/2022   K 4.1 06/02/2022   CL 104 06/02/2022   CREATININE 0.57 06/02/2022   BUN 12 06/02/2022   CO2 28 06/02/2022   TSH 1.84 06/02/2022   HGBA1C 5.4 06/02/2022   MICROALBUR <0.7 06/02/2022    MM 3D SCREEN BREAST BILATERAL  Result Date: 08/04/2021 CLINICAL DATA:  Screening. EXAM: DIGITAL SCREENING BILATERAL MAMMOGRAM WITH TOMOSYNTHESIS AND CAD TECHNIQUE: Bilateral screening digital craniocaudal and mediolateral oblique mammograms were obtained. Bilateral screening digital breast tomosynthesis was performed. The images were evaluated with computer-aided detection. COMPARISON:  Previous exam(s). ACR Breast Density Category b: There are scattered  areas of fibroglandular density. FINDINGS: There are no findings suspicious for malignancy. IMPRESSION: No mammographic evidence of malignancy. A result letter of this screening mammogram will be mailed directly to the patient. RECOMMENDATION: Screening mammogram in one year. (Code:SM-B-01Y) BI-RADS CATEGORY  1: Negative. Electronically Signed   By: Emmaline Kluver M.D.   On: 08/04/2021 16:13    Assessment & Plan:  .Atherosclerosis of aorta Assessment & Plan: Noted on April 2023 trauma CT.     Major  depressive disorder with single episode, in partial remission Assessment & Plan: Continue lexapro as she is anticipating some stressful life transitions in the near future    Type 2 diabetes mellitus without complication, without long-term current use of insulin -     Comprehensive metabolic panel; Future -     CK; Future  Hepatic steatosis Assessment & Plan: Noted on 2018 MRI. Lft's have been historically normal.  Additional serologies ordered to rule out other causes other than MAFLD  Ultrasound ordered .  Continue metformin   Orders: -     US ABDOMEN LIMITED RUQ (LIVER/GB); Future -     Iron, TIBC and Ferritin Panel; Future -     Alpha-1-antitrypsin; Future -     AntiMicrosomal Ab-Liver / Kidney; Future -     Anti-smooth muscle antibody, IgG; Future -     IgG; Future -     Mitochondrial antibodies; Future -     Protime-INR; Future  Hypercholesteremia Assessment & Plan: Starting crestor given concurrent aortic atherosclerosis    Other orders -     Ozempic (2 MG/DOSE); Inject 2 mg into the skin once a week.  Dispense: 9 mL; Refill: 2 -     Omeprazole; Take 1 capsule (40 mg total) by mouth daily.  Dispense: 30 capsule; Refill: 3 -     Rosuvastatin Calcium; Take 1 tablet (10 mg total) by mouth daily.  Dispense: 90 tablet; Refill: 1     Follow-up: Return in about 6 months (around 12/08/2022) for follow up diabetes.   Sherlene Shamseresa L Sieara Bremer, MD

## 2022-06-08 NOTE — Assessment & Plan Note (Addendum)
Noted on 2018 MRI. Lft's have been historically normal.  Additional serologies ordered to rule out other causes other than MAFLD  Ultrasound ordered .  Continue metformin

## 2022-06-08 NOTE — Assessment & Plan Note (Signed)
Starting crestor given concurrent aortic atherosclerosis

## 2022-06-08 NOTE — Patient Instructions (Addendum)
Resume omeprazole 40 mg daily in the morning for the reflux  Try sleeping on a wedge pillow   Trial of Crestor 10 mg in the evening (bedtime) for risk management  due to finding of aortic atherosclerosis   Return for labs in 3 weeks

## 2022-06-09 ENCOUNTER — Telehealth: Payer: Self-pay | Admitting: Internal Medicine

## 2022-06-09 NOTE — Telephone Encounter (Signed)
Lft pt vm to call ofc to sch [facility]. thanks ?

## 2022-06-10 ENCOUNTER — Telehealth: Payer: Self-pay | Admitting: Internal Medicine

## 2022-06-10 NOTE — Telephone Encounter (Signed)
Lft pt vm to call ofc to sch [facility]. thanks ?

## 2022-06-24 ENCOUNTER — Other Ambulatory Visit: Payer: Self-pay

## 2022-06-29 ENCOUNTER — Other Ambulatory Visit (INDEPENDENT_AMBULATORY_CARE_PROVIDER_SITE_OTHER): Payer: 59

## 2022-06-29 DIAGNOSIS — E119 Type 2 diabetes mellitus without complications: Secondary | ICD-10-CM | POA: Diagnosis not present

## 2022-06-29 DIAGNOSIS — K760 Fatty (change of) liver, not elsewhere classified: Secondary | ICD-10-CM | POA: Diagnosis not present

## 2022-06-29 LAB — COMPREHENSIVE METABOLIC PANEL
ALT: 20 U/L (ref 0–35)
AST: 18 U/L (ref 0–37)
Albumin: 4.2 g/dL (ref 3.5–5.2)
Alkaline Phosphatase: 55 U/L (ref 39–117)
BUN: 9 mg/dL (ref 6–23)
CO2: 31 mEq/L (ref 19–32)
Calcium: 9 mg/dL (ref 8.4–10.5)
Chloride: 106 mEq/L (ref 96–112)
Creatinine, Ser: 0.52 mg/dL (ref 0.40–1.20)
GFR: 99.99 mL/min (ref >60.00)
Glucose, Bld: 81 mg/dL (ref 70–99)
Potassium: 4.1 mEq/L (ref 3.5–5.1)
Sodium: 143 mEq/L (ref 135–145)
Total Bilirubin: 0.5 mg/dL (ref 0.2–1.2)
Total Protein: 6.3 g/dL (ref 6.0–8.3)

## 2022-06-29 LAB — CK: Total CK: 74 U/L (ref 7–177)

## 2022-06-29 LAB — PROTIME-INR
INR: 1.1 ratio — ABNORMAL HIGH (ref 0.8–1.0)
Prothrombin Time: 11.7 s (ref 9.6–13.1)

## 2022-06-30 LAB — IRON,TIBC AND FERRITIN PANEL: Iron: 62 ug/dL (ref 45–160)

## 2022-07-02 LAB — ANTI-SMOOTH MUSCLE ANTIBODY, IGG: Actin (Smooth Muscle) Antibody (IGG): <20 U (ref <20)

## 2022-07-04 LAB — ANTI-MICROSOMAL ANTIBODY LIVER / KIDNEY: LKM1 Ab: <=20 U (ref <=20.0)

## 2022-07-04 LAB — IRON,TIBC AND FERRITIN PANEL: TIBC: 355 mcg/dL (calc) (ref 250–450)

## 2022-07-04 LAB — IGG: IgG (Immunoglobin G), Serum: 726 mg/dL (ref 600–1540)

## 2022-07-05 LAB — IRON,TIBC AND FERRITIN PANEL
%SAT: 17 % (calc) (ref 16–45)
Ferritin: 40 ng/mL (ref 16–288)

## 2022-07-05 LAB — ALPHA-1-ANTITRYPSIN: A-1 Antitrypsin, Ser: 122 mg/dL (ref 83–199)

## 2022-07-05 LAB — MITOCHONDRIAL ANTIBODIES: Mitochondrial M2 Ab, IgG: <=20 U (ref <=20.0)

## 2022-08-27 ENCOUNTER — Other Ambulatory Visit: Payer: Self-pay

## 2022-10-15 ENCOUNTER — Other Ambulatory Visit: Payer: Self-pay

## 2022-10-15 MED FILL — Escitalopram Oxalate Tab 20 MG (Base Equiv): ORAL | 90 days supply | Qty: 90 | Fill #1 | Status: AC

## 2022-12-17 ENCOUNTER — Other Ambulatory Visit: Payer: Self-pay | Admitting: Internal Medicine

## 2022-12-17 DIAGNOSIS — Z1231 Encounter for screening mammogram for malignant neoplasm of breast: Secondary | ICD-10-CM

## 2023-01-07 ENCOUNTER — Ambulatory Visit
Admission: RE | Admit: 2023-01-07 | Discharge: 2023-01-07 | Disposition: A | Discharge status: Home / Self Care | Payer: 59 | Source: Ambulatory Visit | Attending: Internal Medicine | Admitting: Internal Medicine

## 2023-01-07 DIAGNOSIS — Z1231 Encounter for screening mammogram for malignant neoplasm of breast: Secondary | ICD-10-CM | POA: Insufficient documentation

## 2023-01-10 ENCOUNTER — Other Ambulatory Visit: Payer: Self-pay

## 2023-01-14 ENCOUNTER — Other Ambulatory Visit: Payer: Self-pay

## 2023-01-14 ENCOUNTER — Other Ambulatory Visit: Payer: Self-pay | Admitting: Internal Medicine

## 2023-01-17 ENCOUNTER — Other Ambulatory Visit: Payer: Self-pay

## 2023-01-17 MED FILL — Rosuvastatin Calcium Tab 10 MG: ORAL | 90 days supply | Qty: 90 | Fill #0 | Status: AC

## 2023-02-14 ENCOUNTER — Other Ambulatory Visit: Payer: Self-pay

## 2023-02-14 ENCOUNTER — Encounter: Payer: Self-pay | Admitting: Internal Medicine

## 2023-02-14 ENCOUNTER — Ambulatory Visit (INDEPENDENT_AMBULATORY_CARE_PROVIDER_SITE_OTHER): Payer: 59 | Admitting: Internal Medicine

## 2023-02-14 VITALS — BP 130/86 | HR 75 | Ht 65.0 in | Wt 170.0 lb

## 2023-02-14 DIAGNOSIS — J40 Bronchitis, not specified as acute or chronic: Secondary | ICD-10-CM | POA: Diagnosis not present

## 2023-02-14 MED ORDER — PREDNISONE 10 MG PO TABS
ORAL_TABLET | ORAL | 0 refills | Status: AC
  Filled 2023-02-14: qty 21, 6d supply, fill #0

## 2023-02-14 MED ORDER — PROMETHAZINE-DM 6.25-15 MG/5ML PO SYRP
5.0000 mL | ORAL_SOLUTION | Freq: Four times a day (QID) | ORAL | 0 refills | Status: DC | PRN
  Filled 2023-02-14: qty 118, 6d supply, fill #0

## 2023-02-14 MED ORDER — AZITHROMYCIN 500 MG PO TABS
500.0000 mg | ORAL_TABLET | Freq: Every day | ORAL | 0 refills | Status: DC
  Filled 2023-02-14: qty 7, 7d supply, fill #0

## 2023-02-14 NOTE — Telephone Encounter (Signed)
Spoke with pt and scheduled her for an appt this evening at 5 pm.

## 2023-02-14 NOTE — Progress Notes (Signed)
Subjective:  Patient ID: Miranda Huang, female    DOB: 25-May-1960  Age: 62 y.o. MRN: [id number]  CC: The encounter diagnosis was Bronchitis.   HPI Miranda Huang presents for  Chief Complaint  Patient presents with   Cough    Cough x 62 week. Productive only in the morning with a little green phlegm, wheezing at times. Pt is taking mucinex in the morning and at night and tessalon pears.     Miranda Huang is a 62 yrold feal with type 2 DM , hepatic steatosis and major depressive disorder who presents with a 1 week history of cough which has been mostly nonproductive  except scant amounts in the morning.    Cough  has been intermittent and  paroxysmal,  without wheezing , shortness of breath  fevers,  or body aches.  Denies pleurisy but has coughed so much her ribs starting to hurt .  Using OTC cough suppression  only.  No recent sick contacts.  Works from home.    Outpatient Medications Prior to Visit  Medication Sig Dispense Refill   Accu-Chek FastClix Lancets MISC USE AS DIRECTED TWICE DAILY AND AS NEEDED 102 each 2   ACCU-CHEK GUIDE test strip USE AS DIRECTED TWICE DAILY AND AS NEEDED 100 strip 12   ALPRAZolam (XANAX) 0.25 MG tablet TAKE ONE TABLET BY MOUTH AT BEDTIME AS NEEDED FOR ANXIETY 30 tablet 3   escitalopram (LEXAPRO) 20 MG tablet TAKE 1 TABLET BY MOUTH DAILY 90 tablet 1   metFORMIN (GLUCOPHAGE) 500 MG tablet Take 1 tablet (500 mg total) by mouth 2 (two) times daily with a meal. 180 tablet 3   omeprazole (PRILOSEC) 40 MG capsule Take 1 capsule (40 mg total) by mouth daily. 30 capsule 3   rosuvastatin (CRESTOR) 10 MG tablet Take 1 tablet (10 mg total) by mouth daily. 90 tablet 1   Semaglutide, 2 MG/DOSE, (OZEMPIC, 2 MG/DOSE,) 8 MG/3ML SOPN Inject 2 mg into the skin once a week. 9 mL 2   sucralfate (CARAFATE) 1 g tablet TAKE 1 TABLET BY MOUTH 4 TIMES DAILY - WITH MEALS AND AT BEDTIME. 120 tablet 1   No facility-administered medications prior to visit.    Review of Systems;  Patient  denies headache, fevers, malaise, unintentional weight loss, skin rash, eye pain, sinus congestion and sinus pain, sore throat, dysphagia,  hemoptysis ,  dyspnea, wheezing, chest pain, palpitations, orthopnea, edema, abdominal pain, nausea, melena, diarrhea, constipation, flank pain, dysuria, hematuria, urinary  Frequency, nocturia, numbness, tingling, seizures,  Focal weakness, Loss of consciousness,  Tremor, insomnia, depression, anxiety, and suicidal ideation.      Objective:  BP 130/86   Pulse 75   Ht 5\' 5"  (1.651 m)   Wt 170 lb (77.1 kg)   SpO2 99%   BMI 28.29 kg/m   BP Readings from Last 3 Encounters:  02/14/23 130/86  06/08/22 130/78  12/24/21 122/71    Wt Readings from Last 3 Encounters:  02/14/23 170 lb (77.1 kg)  06/08/22 170 lb (77.1 kg)  02/16/22 167 lb 8 oz (76 kg)    Physical Exam Vitals reviewed.  Constitutional:      General: She is not in acute distress.    Appearance: Normal appearance. She is normal weight. She is not ill-appearing, toxic-appearing or diaphoretic.  HENT:     Head: Normocephalic.  Eyes:     General: No scleral icterus.       Right eye: No discharge.  Left eye: No discharge.     Conjunctiva/sclera: Conjunctivae normal.  Cardiovascular:     Rate and Rhythm: Normal rate and regular rhythm.     Heart sounds: Normal heart sounds.  Pulmonary:     Effort: Pulmonary effort is normal. No respiratory distress.     Breath sounds: Normal breath sounds and air entry. Transmitted upper airway sounds (bronchial) present.  Musculoskeletal:        General: Normal range of motion.  Skin:    General: Skin is warm and dry.  Neurological:     General: No focal deficit present.     Mental Status: She is alert and oriented to person, place, and time. Mental status is at baseline.  Psychiatric:        Mood and Affect: Mood normal.        Behavior: Behavior normal.        Thought Content: Thought content normal.        Judgment: Judgment normal.      Lab Results  Component Value Date   HGBA1C 5.4 06/02/2022   HGBA1C 5.7 06/10/2021   HGBA1C 5.8 09/09/2020    Lab Results  Component Value Date   CREATININE 0.52 06/29/2022   CREATININE 0.57 06/02/2022   CREATININE 0.56 06/10/2021    Lab Results  Component Value Date   WBC 4.2 06/02/2022   HGB 14.3 06/02/2022   HCT 41.8 06/02/2022   PLT 201.0 06/02/2022   GLUCOSE 81 06/29/2022   CHOL 201 (H) 06/02/2022   TRIG 74.0 06/02/2022   HDL 64.40 06/02/2022   LDLDIRECT 121.0 06/02/2022   LDLCALC 121 (H) 06/02/2022   ALT 20 06/29/2022   AST 18 06/29/2022   NA 143 06/29/2022   K 4.1 06/29/2022   CL 106 06/29/2022   CREATININE 0.52 06/29/2022   BUN 9 06/29/2022   CO2 31 06/29/2022   TSH 1.84 06/02/2022   INR 1.1 (H) 06/29/2022   HGBA1C 5.4 06/02/2022   MICROALBUR <0.7 06/02/2022    MM 3D SCREENING MAMMOGRAM BILATERAL BREAST Result Date: 01/11/2023 CLINICAL DATA:  Screening. EXAM: DIGITAL SCREENING BILATERAL MAMMOGRAM WITH TOMOSYNTHESIS AND CAD TECHNIQUE: Bilateral screening digital craniocaudal and mediolateral oblique mammograms were obtained. Bilateral screening digital breast tomosynthesis was performed. The images were evaluated with computer-aided detection. COMPARISON:  Previous exam(s). ACR Breast Density Category b: There are scattered areas of fibroglandular density. FINDINGS: There are no findings suspicious for malignancy. IMPRESSION: No mammographic evidence of malignancy. A result letter of this screening mammogram will be mailed directly to the patient. RECOMMENDATION: Screening mammogram in one year. (Code:SM-B-01Y) BI-RADS CATEGORY  1: Negative. Electronically Signed   By: Meda Klinefelter M.D.   On: 01/11/2023 13:22    Assessment & Plan:  .Bronchitis Assessment & Plan: Given her history of diabetes. And duration of symptoms, will treat empirically with azithromycin, prednisone and add cough suppressant. If no improvementin one week needs chest x ray     Other orders -     Promethazine-DM; Take 5 mLs by mouth 4 (four) times daily as needed for cough.  Dispense: 118 mL; Refill: 0 -     Azithromycin; Take 1 tablet (500 mg total) by mouth daily.  Dispense: 7 tablet; Refill: 0 -     predniSONE; Take 6 tablets (60 mg total) by mouth daily for 1 day, THEN 5 tablets (50 mg total) daily for 1 day, THEN 4 tablets (40 mg total) daily for 1 day, THEN 3 tablets (30 mg total) daily for 1 day,  THEN 2 tablets (20 mg total) daily for 1 day, THEN 1 tablet (10 mg total) daily for 1 day.  Dispense: 21 tablet; Refill: 0    Follow-up: No follow-ups on file.   Sherlene Shams, MD

## 2023-02-14 NOTE — Assessment & Plan Note (Addendum)
Given her history of diabetes. And duration of symptoms, will treat empirically with azithromycin, prednisone and add cough suppressant. If no improvementin one week needs chest x ray

## 2023-02-21 ENCOUNTER — Other Ambulatory Visit: Payer: Self-pay

## 2023-03-23 ENCOUNTER — Other Ambulatory Visit: Payer: Self-pay | Admitting: Internal Medicine

## 2023-03-23 ENCOUNTER — Other Ambulatory Visit: Payer: Self-pay

## 2023-03-23 MED ORDER — ESCITALOPRAM OXALATE 20 MG PO TABS
20.0000 mg | ORAL_TABLET | Freq: Every day | ORAL | 1 refills | Status: AC
  Filled 2023-03-23: qty 90, 90d supply, fill #0
  Filled 2023-07-26: qty 30, 30d supply, fill #1
  Filled 2023-09-09: qty 30, 30d supply, fill #2
  Filled 2023-10-20: qty 30, 30d supply, fill #3

## 2023-05-24 ENCOUNTER — Other Ambulatory Visit (HOSPITAL_COMMUNITY): Payer: Self-pay

## 2023-06-07 ENCOUNTER — Other Ambulatory Visit: Payer: Self-pay

## 2023-06-07 MED ORDER — AZITHROMYCIN 250 MG PO TABS
ORAL_TABLET | ORAL | 0 refills | Status: AC
  Filled 2023-06-07: qty 6, 5d supply, fill #0

## 2023-06-07 MED ORDER — PREDNISONE 10 MG PO TABS
ORAL_TABLET | ORAL | 0 refills | Status: AC
  Filled 2023-06-07: qty 21, 6d supply, fill #0

## 2023-06-07 MED ORDER — ALBUTEROL SULFATE HFA 108 (90 BASE) MCG/ACT IN AERS
2.0000 | INHALATION_SPRAY | RESPIRATORY_TRACT | 0 refills | Status: AC | PRN
  Filled 2023-06-07: qty 6.7, 30d supply, fill #0

## 2023-07-26 ENCOUNTER — Other Ambulatory Visit: Payer: Self-pay | Admitting: Internal Medicine

## 2023-07-26 ENCOUNTER — Other Ambulatory Visit: Payer: Self-pay

## 2023-07-26 MED ORDER — METFORMIN HCL 500 MG PO TABS
500.0000 mg | ORAL_TABLET | Freq: Two times a day (BID) | ORAL | 11 refills | Status: AC
  Filled 2023-07-26: qty 60, 30d supply, fill #0
  Filled 2023-10-20: qty 60, 30d supply, fill #1

## 2023-07-26 MED FILL — Rosuvastatin Calcium Tab 10 MG: ORAL | 30 days supply | Qty: 30 | Fill #1 | Status: AC

## 2023-09-09 ENCOUNTER — Other Ambulatory Visit: Payer: Self-pay

## 2023-09-09 MED FILL — Rosuvastatin Calcium Tab 10 MG: ORAL | 30 days supply | Qty: 30 | Fill #2 | Status: AC

## 2023-09-12 ENCOUNTER — Other Ambulatory Visit: Payer: Self-pay

## 2023-10-20 ENCOUNTER — Other Ambulatory Visit: Payer: Self-pay

## 2023-10-20 MED ORDER — MELOXICAM 7.5 MG PO TABS
7.5000 mg | ORAL_TABLET | Freq: Every day | ORAL | 0 refills | Status: AC | PRN
  Filled 2023-10-20: qty 30, 15d supply, fill #0

## 2023-11-03 ENCOUNTER — Other Ambulatory Visit: Payer: Self-pay

## 2023-11-03 ENCOUNTER — Ambulatory Visit (INDEPENDENT_AMBULATORY_CARE_PROVIDER_SITE_OTHER): Admitting: Orthopedic Surgery

## 2023-11-03 ENCOUNTER — Encounter: Payer: Self-pay | Admitting: Orthopedic Surgery

## 2023-11-03 DIAGNOSIS — G5601 Carpal tunnel syndrome, right upper limb: Secondary | ICD-10-CM

## 2023-11-03 DIAGNOSIS — M25532 Pain in left wrist: Secondary | ICD-10-CM | POA: Diagnosis not present

## 2023-11-03 DIAGNOSIS — M25531 Pain in right wrist: Secondary | ICD-10-CM

## 2023-11-03 DIAGNOSIS — M180 Bilateral primary osteoarthritis of first carpometacarpal joints: Secondary | ICD-10-CM

## 2023-11-03 MED ORDER — BETAMETHASONE SOD PHOS & ACET 6 (3-3) MG/ML IJ SUSP
6.0000 mg | INTRAMUSCULAR | Status: AC | PRN
Start: 2023-11-03 — End: 2023-11-03
  Administered 2023-11-03: 6 mg via INTRA_ARTICULAR

## 2023-11-03 MED ORDER — LIDOCAINE HCL 1 % IJ SOLN
1.0000 mL | INTRAMUSCULAR | Status: AC | PRN
  Administered 2023-11-03: 1 mL

## 2023-11-03 NOTE — Progress Notes (Signed)
 Miranda Huang - 63 y.o. female MRN [id number]  Date of birth: Apr 11, 1960  Office Visit Note: Visit Date: 11/03/2023 PCP: Marylynn Verneita CROME, MD Referred by: Marylynn Verneita CROME, MD  Subjective: No chief complaint on file.  HPI: Miranda Huang is a pleasant 64 y.o. female who presents today for evaluation of right hand numbness and tingling of the radial sided digits which has been present now for multiple months, worsening in nature.  She is also describing bilateral pain to the basilar aspects of the thumbs which been present now for multiple years.  She has trialed bracing extensively and activity modification to the bilateral hands without significant relief.  Her symptoms are greater on the right side than the left.  She is having nocturnal symptoms as well on the right with significant numbness and tingling that is not responding to bracing.  Pertinent ROS were reviewed with the patient and found to be negative unless otherwise specified above in HPI.   Visit Reason:Right hand numbness and tingling, bilateral thumb CMC arthritis Duration of symptoms: 3 months Hand dominance: left Occupation: scheduler Diabetic: Yes, 6.0 (4/25) Smoking: No Heart/Lung History: none Blood Thinners: none  Prior Testing/EMG: none Injections (Date): none Treatments: bracing, tylenol  arthritis, meloxicam , tumeric Prior Surgery: none  Assessment & Plan: Visit Diagnoses:  1. Pain in left wrist   2. Pain in right wrist   3. Arthritis of carpometacarpal (CMC) joint of both thumbs   4. Carpal tunnel syndrome, right upper limb     Plan: Extensive discussion was had with the patient today regarding her ongoing bilateral thumb CMC osteoarthritis.  X-rays were reviewed in detail today which do show significant degenerative change at the bilateral thumb [facility] articulation.  We discussed treatment modalities ranging from conservative to surgical.  From a conservative standpoint we discussed bracing, injections,  anti-inflammatory medications and activity modification.  From a surgical standpoint we discussed [facility] arthroplasty, risks and benefits as well as the postoperative protocol.  At this juncture, given the severity of symptoms that have remained refractory to conservative care on the right, patient would like to move forward with surgical intervention.  Given the significance of the degenerative change on x-ray which clinically correlates with examination, we can move forward with surgical scheduling of right thumb CMC arthroplasty at the next available date.  For the left side, she is interested in potential cortisone injection.  Risks and benefits of the injection were discussed in detail today and she elected to proceed.  Injection was provided today without issue.  In addition to the right thumb [facility] arthroplasty, patient has appropriate clinical evidence of right-sided carpal tunnel syndrome that is refractory to conservative care.  In conjunction with the thumb CMC arthroplasty, we can perform right sided open carpal tunnel release as well.  Risks and benefits of the procedure were discussed, risks including but not limited to infection, bleeding, scarring, stiffness, nerve injury, tendon injury, vascular injury, hardware complication, recurrence of symptoms and need for subsequent operation.  We also discussed the specifics of the postoperative protocol and the appropriate timeline.  Patient expressed understanding.  Will move forward surgical scheduling of right thumb CMC arthroplasty and open carpal tunnel release at the next available date.   Follow-up: No follow-ups on file.   Meds & Orders: No orders of the defined types were placed in this encounter.   Orders Placed This Encounter  Procedures   XR Wrist Complete Right   XR Wrist Complete Left  Procedures: Hand/UE Inj: L thumb CMC for osteoarthritis on 11/03/2023 8:45 PM Indications: pain Details: 25 G needle Medications: 1 mL  lidocaine  1 %; 6 mg betamethasone  acetate-betamethasone  sodium phosphate 6 (3-3) MG/ML Outcome: tolerated well, no immediate complications Procedure, treatment alternatives, risks and benefits explained, specific risks discussed.          Clinical History: No specialty comments available.  She reports that she has never smoked. She has never used smokeless tobacco. No results for input(s): HGBA1C, LABURIC in the last 8760 hours.  Objective:   Vital Signs: There were no vitals taken for this visit.  Physical Exam  Gen: Well-appearing, in no acute distress; non-toxic CV: Regular Rate. Well-perfused. Warm.  Resp: Breathing unlabored on room air; no wheezing. Psych: Fluid speech in conversation; appropriate affect; normal thought process  Ortho Exam General: Patient is well appearing and in no distress.    Skin and Muscle: No skin changes are apparent to upper extremities.  Muscle bulk and contour normal, no signs of atrophy.      Range of Motion and Palpation Tests: Mobility is full about the elbows with flexion and extension.  Forearm supination and pronation are 85/85 bilaterally.  Wrist flexion/extension is 75/65 bilaterally.  Digital flexion and extension are full.  Thumb opposition is full to the base of the small fingers bilaterally.     No cords or nodules are palpated.  No triggering is observed.     Significant tenderness over the bilateral thumb CMC articulation is observed, positive grind for pain, positive crepitus.  MP hyperextension negative bilateral.    Finklestein test positive right   Neurologic, Vascular, Motor: Sensation is diminished to light touch in the median distribution right side.  Tinel's testing positive at wrist level. Phalen's positive right, Derkan's compression positive right.  Fingers pink and well perfused.  Capillary refill is brisk.     Imaging: No results found.  Past Medical/Family/Surgical/Social History: Medications & Allergies  reviewed per EMR, new medications updated. Patient Active Problem List   Diagnosis Date Noted   Bronchitis 02/14/2023   Atherosclerosis of aorta (HCC) 06/08/2022   Neck pain, bilateral posterior 07/13/2021   Left shoulder pain 07/13/2021   Fracture of body of sternum 06/10/2021   Major depressive disorder with single episode, in partial remission (HCC) 06/18/2020   COVID-19 virus infection 03/10/2019   Hepatic steatosis 02/19/2019   Encounter for preventive health examination 03/19/2017   Fibroid uterus 08/08/2016   Venous insufficiency of both lower extremities 08/06/2015   Menopause 07/09/2014   Insomnia secondary to anxiety 07/09/2014   Diabetes mellitus, type 2 (HCC) 07/09/2014   Hemorrhoid 07/09/2014   Hypercholesteremia 07/09/2014   Cervical polyp 07/09/2014   Avitaminosis D 07/09/2014   S/P laparoscopic cholecystectomy 09/20/2007   Obesity (BMI 30.0-34.9) 03/01/1998   Adaptive colitis 08/24/1995   Acid reflux 08/14/1995   Past Medical History:  Diagnosis Date   Allergy    Anemia    Anxiety    Arthritis    Depression    Diabetes mellitus without complication (HCC)    Nonspecific ST-T changes 07/09/2014   Plantar fasciitis, bilateral 08/06/2015   Followed by Dr. Verta    Pre-diabetes    Swelling    Family History  Problem Relation Age of Onset   Heart disease Father    Alcohol abuse Father    Anxiety disorder Father    Pancreatitis Father    Sudden death Father    Breast cancer Maternal Aunt 48   Cancer Maternal  Aunt    Brain cancer Maternal Uncle    Diabetes Paternal Grandmother    Colon cancer Neg Hx    Esophageal cancer Neg Hx    Stomach cancer Neg Hx    Colon polyps Neg Hx    Rectal cancer Neg Hx    Past Surgical History:  Procedure Laterality Date   CESAREAN SECTION  1985 and 1990   CHOLECYSTECTOMY  03/02/2007   DILATION AND CURETTAGE OF UTERUS  03/02/2007   EYE SURGERY     TONSILLECTOMY AND ADENOIDECTOMY     Social History   Occupational  History   Not on file  Tobacco Use   Smoking status: Never   Smokeless tobacco: Never  Vaping Use   Vaping status: Never Used  Substance and Sexual Activity   Alcohol use: Yes    Alcohol/week: 0.0 standard drinks of alcohol    Comment: occassionally 1-2 glasses of wine every other week    Drug use: No   Sexual activity: Not on file    Momoko Slezak Estela) Arlinda, M.D. [facility], Hand Surgery

## 2023-11-04 NOTE — Telephone Encounter (Signed)
 Called pt and answered her questions regarding her questions about her referral and injections. She is going to send the notes to her insurance today and will call back if she has any issues.

## 2023-11-10 ENCOUNTER — Encounter: Payer: Self-pay | Admitting: Orthopedic Surgery

## 2023-11-14 NOTE — Telephone Encounter (Signed)
 I spoke with the patient and her surgery has been rescheduled for 12/09/23.

## 2023-11-15 ENCOUNTER — Other Ambulatory Visit: Payer: Self-pay

## 2023-11-15 DIAGNOSIS — G5601 Carpal tunnel syndrome, right upper limb: Secondary | ICD-10-CM

## 2023-11-24 ENCOUNTER — Other Ambulatory Visit: Payer: Self-pay

## 2023-11-24 MED ORDER — ESCITALOPRAM OXALATE 10 MG PO TABS
10.0000 mg | ORAL_TABLET | Freq: Every day | ORAL | 1 refills | Status: DC
  Filled 2023-11-24: qty 14, 14d supply, fill #0
  Filled 2023-12-15: qty 14, 14d supply, fill #1

## 2023-12-02 ENCOUNTER — Other Ambulatory Visit: Payer: Self-pay

## 2023-12-02 ENCOUNTER — Encounter (HOSPITAL_BASED_OUTPATIENT_CLINIC_OR_DEPARTMENT_OTHER): Payer: Self-pay | Admitting: Orthopedic Surgery

## 2023-12-08 ENCOUNTER — Encounter (HOSPITAL_BASED_OUTPATIENT_CLINIC_OR_DEPARTMENT_OTHER)
Admission: RE | Admit: 2023-12-08 | Discharge: 2023-12-08 | Disposition: A | Discharge status: Home / Self Care | Payer: PRIVATE HEALTH INSURANCE | Source: Ambulatory Visit | Attending: Orthopedic Surgery | Admitting: Orthopedic Surgery

## 2023-12-08 DIAGNOSIS — F32A Depression, unspecified: Secondary | ICD-10-CM | POA: Diagnosis not present

## 2023-12-08 DIAGNOSIS — Z01818 Encounter for other preprocedural examination: Secondary | ICD-10-CM | POA: Diagnosis present

## 2023-12-08 DIAGNOSIS — K219 Gastro-esophageal reflux disease without esophagitis: Secondary | ICD-10-CM | POA: Diagnosis not present

## 2023-12-08 DIAGNOSIS — G5601 Carpal tunnel syndrome, right upper limb: Secondary | ICD-10-CM | POA: Diagnosis present

## 2023-12-08 DIAGNOSIS — F419 Anxiety disorder, unspecified: Secondary | ICD-10-CM | POA: Diagnosis not present

## 2023-12-08 DIAGNOSIS — E119 Type 2 diabetes mellitus without complications: Secondary | ICD-10-CM | POA: Diagnosis not present

## 2023-12-08 LAB — BASIC METABOLIC PANEL WITH GFR
Anion gap: 9 (ref 5–15)
BUN: 15 mg/dL (ref 8–23)
CO2: 24 mmol/L (ref 22–32)
Calcium: 8.8 mg/dL — ABNORMAL LOW (ref 8.9–10.3)
Chloride: 106 mmol/L (ref 98–111)
Creatinine, Ser: 0.56 mg/dL (ref 0.44–1.00)
GFR, Estimated: >60 mL/min (ref >60)
Glucose, Bld: 125 mg/dL — ABNORMAL HIGH (ref 70–99)
Potassium: 4.6 mmol/L (ref 3.5–5.1)
Sodium: 139 mmol/L (ref 135–145)

## 2023-12-08 NOTE — Progress Notes (Signed)

## 2023-12-08 NOTE — Anesthesia Preprocedure Evaluation (Signed)
 Anesthesia Evaluation  Patient identified by MRN, date of birth, ID band Patient awake    Reviewed: Allergy & Precautions, NPO status , Patient's Chart, lab work & pertinent test results  Airway Mallampati: I  TM Distance: >3 FB Neck ROM: Full    Dental no notable dental hx. (+) Dental Advisory Given, Teeth Intact   Pulmonary neg pulmonary ROS, neg recent URI   Pulmonary exam normal breath sounds clear to auscultation       Cardiovascular negative cardio ROS Normal cardiovascular exam Rhythm:Regular Rate:Normal     Neuro/Psych  PSYCHIATRIC DISORDERS Anxiety Depression    negative neurological ROS     GI/Hepatic negative GI ROS, Neg liver ROS,GERD  ,,  Endo/Other  diabetes    Renal/GU negative Renal ROS     Musculoskeletal negative musculoskeletal ROS (+) Arthritis ,    Abdominal  (+) + obese  Peds  Hematology negative hematology ROS (+) Blood dyscrasia, anemia   Anesthesia Other Findings   Reproductive/Obstetrics                              Anesthesia Physical Anesthesia Plan  ASA: 2  Anesthesia Plan: MAC   Post-op Pain Management: Tylenol  PO (pre-op)*   Induction:   PONV Risk Score and Plan: 2 and Ondansetron, Treatment may vary due to age or medical condition, Propofol infusion and TIVA  Airway Management Planned: Natural Airway  Additional Equipment:   Intra-op Plan:   Post-operative Plan:   Informed Consent: I have reviewed the patients History and Physical, chart, labs and discussed the procedure including the risks, benefits and alternatives for the proposed anesthesia with the patient or authorized representative who has indicated his/her understanding and acceptance.     Dental advisory given  Plan Discussed with: CRNA  Anesthesia Plan Comments:          Anesthesia Quick Evaluation

## 2023-12-08 NOTE — Telephone Encounter (Signed)
 Called pt and let her know we have it taken care of

## 2023-12-09 ENCOUNTER — Encounter (HOSPITAL_BASED_OUTPATIENT_CLINIC_OR_DEPARTMENT_OTHER)
Admission: RE | Disposition: A | Discharge status: Home / Self Care | Payer: Self-pay | Source: Home / Self Care | Attending: Orthopedic Surgery

## 2023-12-09 ENCOUNTER — Encounter (HOSPITAL_BASED_OUTPATIENT_CLINIC_OR_DEPARTMENT_OTHER): Payer: Self-pay | Admitting: Orthopedic Surgery

## 2023-12-09 ENCOUNTER — Encounter (HOSPITAL_BASED_OUTPATIENT_CLINIC_OR_DEPARTMENT_OTHER): Payer: Self-pay | Admitting: Anesthesiology

## 2023-12-09 ENCOUNTER — Other Ambulatory Visit: Payer: Self-pay

## 2023-12-09 ENCOUNTER — Ambulatory Visit (HOSPITAL_BASED_OUTPATIENT_CLINIC_OR_DEPARTMENT_OTHER): Payer: Self-pay | Admitting: Anesthesiology

## 2023-12-09 ENCOUNTER — Ambulatory Visit (HOSPITAL_BASED_OUTPATIENT_CLINIC_OR_DEPARTMENT_OTHER)
Admission: RE | Admit: 2023-12-09 | Discharge: 2023-12-09 | Disposition: A | Discharge status: Home / Self Care | Payer: PRIVATE HEALTH INSURANCE | Attending: Orthopedic Surgery | Admitting: Orthopedic Surgery

## 2023-12-09 DIAGNOSIS — G5601 Carpal tunnel syndrome, right upper limb: Secondary | ICD-10-CM

## 2023-12-09 DIAGNOSIS — K219 Gastro-esophageal reflux disease without esophagitis: Secondary | ICD-10-CM | POA: Insufficient documentation

## 2023-12-09 DIAGNOSIS — F418 Other specified anxiety disorders: Secondary | ICD-10-CM

## 2023-12-09 DIAGNOSIS — F419 Anxiety disorder, unspecified: Secondary | ICD-10-CM | POA: Insufficient documentation

## 2023-12-09 DIAGNOSIS — F32A Depression, unspecified: Secondary | ICD-10-CM | POA: Insufficient documentation

## 2023-12-09 DIAGNOSIS — E119 Type 2 diabetes mellitus without complications: Secondary | ICD-10-CM | POA: Insufficient documentation

## 2023-12-09 HISTORY — DX: Gastro-esophageal reflux disease without esophagitis: K21.9

## 2023-12-09 HISTORY — PX: CARPAL TUNNEL RELEASE: SHX101

## 2023-12-09 LAB — GLUCOSE, CAPILLARY: Glucose-Capillary: 137 mg/dL — ABNORMAL HIGH (ref 70–99)

## 2023-12-09 SURGERY — CARPAL TUNNEL RELEASE
Anesthesia: Monitor Anesthesia Care | Laterality: Right

## 2023-12-09 MED ORDER — FENTANYL CITRATE (PF) 100 MCG/2ML IJ SOLN
INTRAMUSCULAR | Status: AC
  Filled 2023-12-09: qty 2

## 2023-12-09 MED ORDER — FENTANYL CITRATE (PF) 100 MCG/2ML IJ SOLN: 25.0000 ug | INTRAMUSCULAR | Status: DC | PRN

## 2023-12-09 MED ORDER — ACETAMINOPHEN 500 MG PO TABS
ORAL_TABLET | ORAL | Status: AC
Start: 2023-12-09 — End: 2023-12-09
  Filled 2023-12-09: qty 2

## 2023-12-09 MED ORDER — CEFAZOLIN SODIUM-DEXTROSE 2-4 GM/100ML-% IV SOLN
INTRAVENOUS | Status: AC
  Filled 2023-12-09: qty 100

## 2023-12-09 MED ORDER — PROPOFOL 10 MG/ML IV BOLUS
INTRAVENOUS | Status: DC | PRN
  Administered 2023-12-09: 50 mg via INTRAVENOUS
  Administered 2023-12-09: 50 ug/kg/min via INTRAVENOUS

## 2023-12-09 MED ORDER — MIDAZOLAM HCL 2 MG/2ML IJ SOLN
INTRAMUSCULAR | Status: AC
  Filled 2023-12-09: qty 2

## 2023-12-09 MED ORDER — FENTANYL CITRATE (PF) 100 MCG/2ML IJ SOLN
INTRAMUSCULAR | Status: DC | PRN
  Administered 2023-12-09: 50 ug via INTRAVENOUS

## 2023-12-09 MED ORDER — DROPERIDOL 2.5 MG/ML IJ SOLN: 0.6250 mg | Freq: Once | INTRAMUSCULAR | Status: DC | PRN

## 2023-12-09 MED ORDER — ACETAMINOPHEN 500 MG PO TABS
1000.0000 mg | ORAL_TABLET | Freq: Once | ORAL | Status: AC
  Administered 2023-12-09: 1000 mg via ORAL

## 2023-12-09 MED ORDER — LACTATED RINGERS IV SOLN
INTRAVENOUS | Status: DC
Start: 2023-12-09 — End: 2023-12-09

## 2023-12-09 MED ORDER — LIDOCAINE-EPINEPHRINE (PF) 1 %-1:200000 IJ SOLN
INTRAMUSCULAR | Status: DC | PRN
  Administered 2023-12-09: 10 mL

## 2023-12-09 MED ORDER — MIDAZOLAM HCL 2 MG/2ML IJ SOLN
INTRAMUSCULAR | Status: DC | PRN
  Administered 2023-12-09: 1 mg via INTRAVENOUS

## 2023-12-09 MED ORDER — CEFAZOLIN SODIUM-DEXTROSE 2-4 GM/100ML-% IV SOLN
2.0000 g | INTRAVENOUS | Status: AC
  Administered 2023-12-09: 2 g via INTRAVENOUS

## 2023-12-09 SURGICAL SUPPLY — 36 items
BLADE MINI RND TIP GREEN BEAV (BLADE) ×2 IMPLANT
BLADE SURG 15 STRL LF DISP TIS (BLADE) ×4 IMPLANT
BNDG COHESIVE 4X5 TAN STRL LF (GAUZE/BANDAGES/DRESSINGS) ×2 IMPLANT
BNDG COMPR ESMARK 4X3 LF (GAUZE/BANDAGES/DRESSINGS) ×2 IMPLANT
BNDG ELASTIC 4INX 5YD STR LF (GAUZE/BANDAGES/DRESSINGS) ×2 IMPLANT
BNDG GAUZE DERMACEA FLUFF 4 (GAUZE/BANDAGES/DRESSINGS) ×2 IMPLANT
CHLORAPREP W/TINT 26 (MISCELLANEOUS) ×2 IMPLANT
CORD BIPOLAR FORCEPS 12FT (ELECTRODE) ×2 IMPLANT
COVER BACK TABLE 60X90IN (DRAPES) ×2 IMPLANT
CUFF TOURN SGL QUICK 18X4 (TOURNIQUET CUFF) IMPLANT
DRAPE HAND 77X146 (DRAPES) ×2 IMPLANT
DRAPE SURG 17X23 STRL (DRAPES) ×2 IMPLANT
GAUZE PAD ABD 8X10 STRL (GAUZE/BANDAGES/DRESSINGS) ×2 IMPLANT
GAUZE SPONGE 4X4 12PLY STRL (GAUZE/BANDAGES/DRESSINGS) ×2 IMPLANT
GAUZE STRETCH 2X75IN STRL (MISCELLANEOUS) ×2 IMPLANT
GAUZE XEROFORM 1X8 LF (GAUZE/BANDAGES/DRESSINGS) ×2 IMPLANT
GLOVE BIO SURGEON STRL SZ7.5 (GLOVE) ×2 IMPLANT
GLOVE BIOGEL PI IND STRL 7.5 (GLOVE) ×2 IMPLANT
GOWN STRL REUS W/ TWL LRG LVL3 (GOWN DISPOSABLE) ×4 IMPLANT
GOWN STRL SURGICAL XL XLNG (GOWN DISPOSABLE) ×4 IMPLANT
NDL HYPO 25X5/8 SAFETYGLIDE (NEEDLE) IMPLANT
NEEDLE HYPO 25X5/8 SAFETYGLIDE (NEEDLE) ×1 IMPLANT
NS IRRIG 1000ML POUR BTL (IV SOLUTION) IMPLANT
PACK BASIN DAY SURGERY FS (CUSTOM PROCEDURE TRAY) ×2 IMPLANT
SHEET MEDIUM DRAPE 40X70 STRL (DRAPES) ×2 IMPLANT
SPIKE FLUID TRANSFER (MISCELLANEOUS) IMPLANT
SPLINT FIBERGLASS 4X30 (CAST SUPPLIES) IMPLANT
STOCKINETTE IMPERVIOUS 9X36 MD (GAUZE/BANDAGES/DRESSINGS) ×2 IMPLANT
SUCTION TUBE FRAZIER 10FR DISP (SUCTIONS) IMPLANT
SUT ETHILON 4 0 PS 2 18 (SUTURE) ×2 IMPLANT
SUT VIC AB 3-0 SH 27X BRD (SUTURE) IMPLANT
SYR BULB EAR ULCER 3OZ GRN STR (SYRINGE) ×4 IMPLANT
SYR CONTROL 10ML LL (SYRINGE) ×2 IMPLANT
TOWEL GREEN STERILE FF (TOWEL DISPOSABLE) ×4 IMPLANT
TUBE CONNECTING 20X1/4 (TUBING) IMPLANT
UNDERPAD 30X36 HEAVY ABSORB (UNDERPADS AND DIAPERS) ×2 IMPLANT

## 2023-12-09 NOTE — Op Note (Signed)
 NAME: THEOLA CUELLAR MEDICAL RECORD NO: [id number] DATE OF BIRTH: 1960-10-16 FACILITY: [facility] LOCATION:  SURGERY CENTER PHYSICIAN: GILDARDO ALDERTON, MD   OPERATIVE REPORT   DATE OF PROCEDURE: 12/09/23    PREOPERATIVE DIAGNOSIS: Right carpal tunnel syndrome   POSTOPERATIVE DIAGNOSIS: Right carpal tunnel syndrome   PROCEDURE: Right open carpal tunnel release   SURGEON:  GILDARDO ALDERTON, M.D.   ASSISTANT: None   ANESTHESIA:  Local with sedation   INTRAVENOUS FLUIDS:  Per anesthesia flow sheet.   ESTIMATED BLOOD LOSS:  Minimal.   COMPLICATIONS:  None.   SPECIMENS:  none   TOURNIQUET TIME:    Total Tourniquet Time Documented: Upper Arm (Right) - 5 minutes Total: Upper Arm (Right) - 5 minutes    DISPOSITION:  Stable to PACU.   INDICATIONS: 63 year old female seen in the outpatient setting and found to have clinical evidence of right-sided carpal tunnel syndrome refractory to conservative care.  Patient was indicated for right open carpal tunnel release.  Risks and benefits of surgery were discussed including the risks of infection, bleeding, scarring, stiffness, nerve injury, vascular injury, tendon injury, need for subsequent operation, recurrence, persistent numbness.  She voiced understanding of these risks and elected to proceed.  OPERATIVE COURSE: Patient was seen and identified in the preoperative area and marked appropriately.  Surgical consent had been signed. Preoperative IV antibiotic prophylaxis was given. She was transferred to the operating room and placed in supine position with the Right upper extremity on an arm board.  Sedation was induced by the anesthesiologist.  Right upper extremity was prepped and draped in normal sterile orthopedic fashion.  A surgical pause was performed between the surgeons, anesthesia, and operating room staff and all were in agreement as to the patient, procedure, and site of procedure.  Tourniquet was placed and padded  appropriately to the right upper arm.  10 cc of 1% lidocaine  with epinephrine was utilized around the planned incisional site.  The arm was exsanguinated and the tourniquet was inflated to 250 mmHg.  Longitudinal incision was designed in the thenar crease in line with the radial border of the ring finger, from intersection of Kaplan's cardinal line down to level of the distal wrist crease. Incision was carried down utilizing 15 blade. Blunt dissection was performed, palmar fascia was identified and incised sharply utilizing a Beaver blade. Careful dissection was performed down, thenar musculature was bluntly elevated and the transcarpal ligament was identified.  A Beaver blade was then utilized to divide the transcarpal ligament in a distal to proximal fashion.  Fat surrounding the palmar arch was encountered to confirm appropriate distal release.  At the level of the wrist crease, skin flaps were elevated to allow for release of the proximal portion of transverse carpal ligament as well as the antebrachial fascia into the forearm. Appropriate decompression was noted of the median nerve, care was taken to protect the nerve in its entirety throughout. Once we were satisfied with our proximal and distal release, tourniquet was deflated and bipolar electrocautery was utilized for hemostasis.  Tourniquet time was 5 minutes.  Copious irrigation was performed followed by closure utilizing 4-0 nylon in standard fashion for the skin surface. Sterile dressings were applied followed by a loosefitting soft hand dressing. Patient was subsequently awoken from anesthesia and transported to the postop recovery unit in stable condition.   Post-operative plan: The patient will recover in the post-anesthesia care unit and then be discharged home.  The patient will be non weight bearing on the  right upper extremity in a soft dressing.   I will see the patient back in the office in 2 weeks for postoperative followup.    Athaliah Baumbach, MD Electronically signed, 12/09/23

## 2023-12-09 NOTE — Transfer of Care (Signed)
 Immediate Anesthesia Transfer of Care Note  Patient: Miranda Huang  Procedure(s) Performed: Procedure(s) (LRB): CARPAL TUNNEL RELEASE (Right)  Patient Location: PACU  Anesthesia Type: MAC  Level of Consciousness: awake, alert , oriented and patient cooperative  Airway & Oxygen Therapy: Patient Spontanous Breathing   Post-op Assessment: Report given to PACU RN and Post -op Vital signs reviewed and stable  Post vital signs: Reviewed and stable  Complications: No apparent anesthesia complications Last Vitals:  Vitals Value Taken Time  BP    Temp    Pulse 61 12/09/23 08:17  Resp 12 12/09/23 08:17  SpO2 94 % 12/09/23 08:17    Last Pain:  Vitals:   12/09/23 9378  PainSc: 0-No pain      Patients Stated Pain Goal: 8 (12/09/23 0621)  Complications: No notable events documented.

## 2023-12-09 NOTE — Anesthesia Procedure Notes (Signed)
 Procedure Name: MAC Date/Time: 12/09/2023 7:38 AM  Performed by: Delayne Olam BIRCH, CRNAPre-anesthesia Checklist: Patient identified, Emergency Drugs available and Suction available Oxygen Delivery Method: Simple face mask

## 2023-12-09 NOTE — Anesthesia Postprocedure Evaluation (Signed)
 Anesthesia Post Note  Patient: Miranda Huang  Procedure(s) Performed: CARPAL TUNNEL RELEASE (Right)     Patient location during evaluation: PACU Anesthesia Type: MAC Level of consciousness: awake and alert Pain management: pain level controlled Vital Signs Assessment: post-procedure vital signs reviewed and stable Respiratory status: spontaneous breathing Cardiovascular status: stable Anesthetic complications: no   No notable events documented.  Last Vitals:  Vitals:   12/09/23 0828 12/09/23 0834  BP: (!) 156/75 106/64  Pulse: 64 60  Resp: 13 20  Temp:  (!) 36.3 C  SpO2: 94% 94%    Last Pain:  Vitals:   12/09/23 0834  TempSrc: Temporal  PainSc: 0-No pain                 Norleen Pope

## 2023-12-09 NOTE — Discharge Instructions (Addendum)
    Hand Surgery Postop Instructions    Dressings: Maintain postoperative dressing for 5 days.   After 5 days, it is okay to unwrap postoperative dressing and apply Band-Aid or rewrap.   Keep operative site clean and dry until orthopedic follow-up.  Wound Care: Keep your hand elevated above the level of your heart.  Do not allow it to dangle by your side. Moving your fingers is advised to stimulate circulation but will depend on the site of your surgery.  If you have a splint applied, your doctor will advise you regarding movement.  Activity: Do not drive or operate machinery until clearance given from physician. No heavy lifting with operative extremity.  Diet:  Drink liquids today or eat a light diet.  You may resume a regular diet tomorrow.    General expectations: Pain for two to three days. Take prescribed medication if given, transition to over-the-counter medication as quickly as possible. Fingers may become slightly swollen.  Call your doctor if any of the following occur: Severe pain not relieved by pain medication. Elevated temperature. Dressing soaked with blood. Inability to move fingers. White or bluish color to fingers.   Per [facility] clinic policy, our goal is ensure optimal postoperative pain control with a multimodal pain management strategy. For all [facility] patients, our goal is to wean post-operative narcotic medications by 6 weeks post-operatively. If this is not possible due to utilization of pain medication prior to surgery, your [facility] doctor will support your acute post-operative pain control for the first 6 weeks postoperatively, with a plan to transition you back to your primary pain team following that. Cyndia Skeeters will work to ensure a Therapist, occupational.  Anshul Trevor Mace, M.D. Hand Surgery [facility]   Post Anesthesia Home Care Instructions  Activity: Get plenty of rest for the remainder of the day. A responsible individual  must stay with you for 24 hours following the procedure.  For the next 24 hours, DO NOT: -Drive a car -Advertising copywriter -Drink alcoholic beverages -Take any medication unless instructed by your physician -Make any legal decisions or sign important papers.  Meals: Start with liquid foods such as gelatin or soup. Progress to regular foods as tolerated. Avoid greasy, spicy, heavy foods. If nausea and/or vomiting occur, drink only clear liquids until the nausea and/or vomiting subsides. Call your physician if vomiting continues.  Special Instructions/Symptoms: Your throat may feel dry or sore from the anesthesia or the breathing tube placed in your throat during surgery. If this causes discomfort, gargle with warm salt water. The discomfort should disappear within 24 hours.  If you had a scopolamine patch placed behind your ear for the management of post- operative nausea and/or vomiting:  1. The medication in the patch is effective for 72 hours, after which it should be removed.  Wrap patch in a tissue and discard in the trash. Wash hands thoroughly with soap and water. 2. You may remove the patch earlier than 72 hours if you experience unpleasant side effects which may include dry mouth, dizziness or visual disturbances. 3. Avoid touching the patch. Wash your hands with soap and water after contact with the patch.

## 2023-12-09 NOTE — H&P (Signed)
 @LOGODEPT @  Miranda Huang - 63 y.o. female MRN [id number]  Date of birth: 07/24/1960   HAND SURGERY H&P UPDATE   HPI: Patient is a 63 y.o. female who presents with right carpal tunnel sydrome.  Patient denies any changes to their medical history or new systemic symptoms today.    Past Medical History:  Diagnosis Date   Allergy    Anemia    Anxiety    Arthritis    Depression    Diabetes mellitus without complication (HCC)    GERD (gastroesophageal reflux disease)    Nonspecific ST-T changes 07/09/2014   Plantar fasciitis, bilateral 08/06/2015   Followed by Dr. Verta    Pre-diabetes    Swelling    Past Surgical History:  Procedure Laterality Date   CESAREAN SECTION  1985 and 1990   CHOLECYSTECTOMY  03/02/2007   DILATION AND CURETTAGE OF UTERUS  03/02/2007   EYE SURGERY     TONSILLECTOMY AND ADENOIDECTOMY     Social History   Socioeconomic History   Marital status: Married    Spouse name: Not on file   Number of children: Not on file   Years of education: Not on file   Highest education level: Bachelor's degree (e.g., BA, AB, BS)  Occupational History   Not on file  Tobacco Use   Smoking status: Never   Smokeless tobacco: Never  Vaping Use   Vaping status: Never Used  Substance and Sexual Activity   Alcohol use: Yes    Alcohol/week: 0.0 standard drinks of alcohol    Comment: occassionally 1-2 glasses of wine every other week    Drug use: No   Sexual activity: Yes    Birth control/protection: Post-menopausal  Other Topics Concern   Not on file  Social History Narrative   Not on file   Social Drivers of Health   Financial Resource Strain: Low Risk  (06/08/2022)   Overall Financial Resource Strain (CARDIA)    Difficulty of Paying Living Expenses: Not hard at all  Food Insecurity: No Food Insecurity (06/08/2022)   Hunger Vital Sign    Worried About Running Out of Food in the Last Year: Never true    Ran Out of Food in the Last Year: Never true   Transportation Needs: No Transportation Needs (06/08/2022)   PRAPARE - Administrator, Civil Service (Medical): No    Lack of Transportation (Non-Medical): No  Physical Activity: Unknown (06/08/2022)   Exercise Vital Sign    Days of Exercise per Week: 0 days    Minutes of Exercise per Session: Not on file  Stress: No Stress Concern Present (06/08/2022)   [employer] of Occupational Health - Occupational Stress Questionnaire    Feeling of Stress : Not at all  Social Connections: Socially Integrated (06/08/2022)   Social Connection and Isolation Panel    Frequency of Communication with Friends and Family: More than three times a week    Frequency of Social Gatherings with Friends and Family: More than three times a week    Attends Religious Services: More than 4 times per year    Active Member of [employer] or Organizations: Yes    Attends Banker Meetings: Patient declined    Marital Status: Married   Family History  Problem Relation Age of Onset   Heart disease Father    Alcohol abuse Father    Anxiety disorder Father    Pancreatitis Father    Sudden death Father    Breast cancer  Maternal Aunt 48   Cancer Maternal Aunt    Brain cancer Maternal Uncle    Diabetes Paternal Grandmother    Colon cancer Neg Hx    Esophageal cancer Neg Hx    Stomach cancer Neg Hx    Colon polyps Neg Hx    Rectal cancer Neg Hx    - negative except otherwise stated in the family history section Allergies  Allergen Reactions   Codeine  Itching    Pt pretreats with Benadryl   Erythromycin Nausea And Vomiting    GI upset   Prior to Admission medications   Medication Sig Start Date End Date Taking? Authorizing Provider  escitalopram  (LEXAPRO ) 20 MG tablet Take 1 tablet (20 mg total) by mouth daily. 03/23/23  Yes Marylynn Verneita CROME, MD  magnesium  oxide (MAG-OX) 400 (240 Mg) MG tablet Take 400 mg by mouth daily.   Yes [provider]  meloxicam  (MOBIC ) 7.5 MG tablet Take 1-2  tablets (7.5-15 mg total) by mouth daily as needed for pain. 10/20/23  Yes   metFORMIN  (GLUCOPHAGE ) 500 MG tablet Take 1 tablet (500 mg total) by mouth 2 (two) times daily with a meal. Patient taking differently: Take 500 mg by mouth daily with breakfast. 07/26/23 07/25/24 Yes Marylynn Verneita CROME, MD  omeprazole  (PRILOSEC) 40 MG capsule Take 1 capsule (40 mg total) by mouth daily. 06/08/22  Yes Marylynn Verneita CROME, MD  Turmeric 500 MG TABS Take by mouth.   Yes [provider]  Accu-Chek FastClix Lancets MISC USE AS DIRECTED TWICE DAILY AND AS NEEDED 02/27/19   Marylynn Verneita CROME, MD  ACCU-CHEK GUIDE test strip USE AS DIRECTED TWICE DAILY AND AS NEEDED 08/21/18   Marylynn Verneita CROME, MD  albuterol  (VENTOLIN  HFA) 108 (90 Base) MCG/ACT inhaler Inhale 2 puffs into the lungs every 4 (four) hours as needed for cough. 06/07/23     escitalopram  (LEXAPRO ) 10 MG tablet Take 1 tablet (10 mg total) by mouth daily. 11/24/23     rosuvastatin  (CRESTOR ) 10 MG tablet Take 1 tablet (10 mg total) by mouth daily. Patient not taking: Reported on 12/02/2023 01/17/23   Marylynn Verneita CROME, MD   No results found. - Positive ROS: All other systems have been reviewed and were otherwise negative with the exception of those mentioned in the HPI and as above.  General: Patient is well appearing and in no distress.    Skin and Muscle: No skin changes are apparent to upper extremities.  Muscle bulk and contour normal, no signs of atrophy.      Range of Motion and Palpation Tests: Mobility is full about the elbows with flexion and extension.  Forearm supination and pronation are 85/85 bilaterally.  Wrist flexion/extension is 75/65 bilaterally.  Digital flexion and extension are full.  Thumb opposition is full to the base of the small fingers bilaterally.     No cords or nodules are palpated.  No triggering is observed.     Significant tenderness over the bilateral thumb CMC articulation is observed, positive grind for pain, positive  crepitus.  MP hyperextension negative bilateral.    Finklestein test positive right   Neurologic, Vascular, Motor: Sensation is diminished to light touch in the median distribution right side.  Tinel's testing positive at wrist level. Phalen's positive right, Derkan's compression positive right.   Fingers pink and well perfused.  Capillary refill is brisk.     Assessment/Plan: OR today for right open carpal tunnel release. We again reviewed the risks of surgery which include  bleeding, infection, damage to neurovascular structures, persistent symptoms, need for additional surgery.  Informed consent was signed.  All questions were answered.   [facility], Hand Surgery

## 2023-12-10 ENCOUNTER — Encounter (HOSPITAL_BASED_OUTPATIENT_CLINIC_OR_DEPARTMENT_OTHER): Payer: Self-pay | Admitting: Orthopedic Surgery

## 2023-12-15 ENCOUNTER — Other Ambulatory Visit: Payer: Self-pay

## 2023-12-20 NOTE — Therapy (Signed)
 OUTPATIENT OCCUPATIONAL THERAPY ORTHO EVALUATION AND DISCHARGE NOTE  Patient Name: Miranda Huang MRN: [id number] DOB:09-Nov-1960, 63 y.o., female Today's Date: 12/22/2023  REFERRING PROVIDER: Arlinda Buster, MD   END OF SESSION:   Past Medical History:  Diagnosis Date   Allergy    Anemia    Anxiety    Arthritis    Depression    Diabetes mellitus without complication (HCC)    GERD (gastroesophageal reflux disease)    Nonspecific ST-T changes 07/09/2014   Plantar fasciitis, bilateral 08/06/2015   Followed by Dr. Verta    Pre-diabetes    Swelling    Past Surgical History:  Procedure Laterality Date   CARPAL TUNNEL RELEASE Right 12/09/2023   Procedure: CARPAL TUNNEL RELEASE;  Surgeon: Arlinda Buster, MD;  Location: [facility];  Service: Orthopedics;  Laterality: Right;  RIGHT OPEN CARPAL TUNNEL RELEASE   CESAREAN SECTION  1985 and 1990   CHOLECYSTECTOMY  03/02/2007   DILATION AND CURETTAGE OF UTERUS  03/02/2007   EYE SURGERY     TONSILLECTOMY AND ADENOIDECTOMY     Patient Active Problem List   Diagnosis Date Noted   Carpal tunnel syndrome, right upper limb 12/09/2023   Bronchitis 02/14/2023   Atherosclerosis of aorta 06/08/2022   Neck pain, bilateral posterior 07/13/2021   Left shoulder pain 07/13/2021   Fracture of body of sternum 06/10/2021   Major depressive disorder with single episode, in partial remission 06/18/2020   COVID-19 virus infection 03/10/2019   Hepatic steatosis 02/19/2019   Encounter for preventive health examination 03/19/2017   Fibroid uterus 08/08/2016   Venous insufficiency of both lower extremities 08/06/2015   Menopause 07/09/2014   Insomnia secondary to anxiety 07/09/2014   Diabetes mellitus, type 2 (HCC) 07/09/2014   Hemorrhoid 07/09/2014   Hypercholesteremia 07/09/2014   Cervical polyp 07/09/2014   Avitaminosis D 07/09/2014   S/P laparoscopic cholecystectomy 09/20/2007   Obesity (BMI 30.0-34.9) 03/01/1998    Adaptive colitis 08/24/1995   Acid reflux 08/14/1995     ONSET DATE:  DOS 12/09/23  REFERRING DIAG: G56.01 (ICD-10-CM) - Carpal tunnel syndrome, right upper limb   THERAPY DIAG:     Localized edema  Muscle weakness (generalized)  Other lack of coordination  Pain in right hand  Rationale for Evaluation and Treatment: Rehabilitation  SUBJECTIVE:   SUBJECTIVE STATEMENT: The patient states hx of paresthesia and pain in their hand and subsequent surgical release of the carpal tunnel. Now the patient states paresthesia is completely, but having mild stiffness, pain, decreased ability to perform I/ADLs.     PERTINENT HISTORY: The patient is now approx ~ 2 weeks s/p Rt hand CTR.   PRECAUTIONS: None relative to this evaluation and episode of care.   RED FLAGS: None   WEIGHT BEARING RESTRICTIONS: Yes: caution with weightbearing for the next 4-6 weeks, recommended less than 5lbs for next 2 weeks with affected hand  PAIN:  Are you having pain? Yes: NPRS scale: mild  now at rest  Pain location:  sx area Pain description: aching and sore Aggravating factors: gripping/squeezing Relieving factors: rest  FALLS: Has patient fallen in last 6 months? No, not a fall risk  PLOF: Independent with I/ADLs  PATIENT GOALS: To improve motion, function with affected surgical hand  NEXT MD VISIT: PRN    OBJECTIVE MEASURES:   ADLs: Overall ADLs: States decreased ability to grab, hold household objects, pain and difficulty to open containers, perform FMS tasks (manipulate fasteners on clothing).     UPPER EXTREMITY ROM:  A/ROM Right eval  Wrist flexion 68  Wrist extension 65  (Blank rows = not tested)                    Hand A/ROM Right eval  Full Fist Ability (or Gap to Distal Palmar Crease) Unable due to stiffness/soreness  Thumb Opposition  (Kapandji Scale)  7/10  (Blank rows = not tested)   HAND STRENGTH & FUNCTION: Eval: Observed weakness in affected hand/arm,  grossly 3-/5 MMT, but specific gripping and resistance training contraindicated today. Also at least mild observed coordination impairments with affected hand/arm due to stiffness and soreness. These deficits are expected to improve with HEP and recommendations.    COORDINATION: Eval: Mild observed coordination impairments with surgical hand, as seen by pain,stiffness, etc. Expected to improve with HEP and recommendations.   SENSATION: Eval:  Light touch mildly diminished especially through sx area. Expected to improve with HEP and recommendations.   EDEMA:   Eval:  Mildly swollen in surgical hand today.  Expected to improve with HEP and recommendations.   COGNITION: Eval: Overall cognitive status: WFL for evaluation today   OBSERVATIONS:   Eval: Surgical site is clean and no overt signs of infection, no drainage, signs of dehiscence, etc.  Tenderness and swelling is within normal limits for post-op timeframe.     TODAY'S TREATMENT:  Post-evaluation treatment:   The patient was given safety information for managing post-op wound, including not to soak wound, to keep clean and dry, to start with gentle scar mobilizations approx 3 days after stitches are removed and if the wound is closed. The patient should replace their wound dressing at least 1x daily, and monitor for signs of infection.  The patient was supplied with compressive gauze to help with swelling as needed.  The patient should contact the surgeon with any concerns immediately.   The patient should also avoid any strong gripping, push, pull, weight bearing or repetitive motion for the next month.  The patient  should not be doing painful activities.  The patient should not rest on their palm, keep the wrist bent for long time periods, or sleep on their hand. After a month, the patient can progressively return to all light, normal activities. Sports and heavy weight lifting should be withheld for a total of 3 months.   The patient  was also educated (explanation and demonstration) on the following home exercise program including tolerable range of motion, gentle passive range of motion, scar care, progressive desensitization, prevention of soft tissue contractures, etc. The patient states understanding all directions and feels comfortable with doing this at home, self-management, and following up with the surgeon as needed/scheduled.    Exercises - Turn [employer] Facing Up & Down  - 4 x daily - 15 reps - Bend and Pull Back Wrist SLOWLY  - 4 x daily - 15 reps - Tendon Glides  - 4 x daily - 5 reps - 3 second hold - Thumb Opposition  - 4-6 x daily - 10 reps - Median Nerve Flossing  - 3 x daily - 5 reps - Wrist Prayer Stretch  - 4 x daily - 3 reps - 15 second hold - Full finger stretches   - 4 x daily - 3 reps - 15 second hold - PUSH KNUCKLES DOWN  - 4 x daily - 3-5 reps - 15 seconds hold  Patient Education - Scar Massage     PATIENT EDUCATION: Education details: See tx section above for details  Person educated: Patient Education method: Verbal Instruction, Teach back, Handouts  Education comprehension: States and demonstrates understanding   HOME EXERCISE PROGRAM: Access Code: [email or web address] URL: [email or web address] Date: 12/22/2023 Prepared by: Melvenia Ada   GOALS: Goals reviewed with patient? Yes   SHORT TERM GOALS: (STG required if POC>30 days) Target Date: 12/22/23  1.  Pt will demo/state understanding of initial HEP and therapist recommendations to improve pain levels, improve motions and ability and eventually return to normal activities.   Goal status: MET    ASSESSMENT:  CLINICAL IMPRESSION: Patient is a 63 y.o. female who was seen today for occupational therapy evaluation for swelling, pain, weakness and decreased functional ability following carpal tunnel release procedure. The patient is appropriate for OT rehab services and benefited from treatment today. The patient got  copious education/treatment today for self-care, wound management, exercises and how to transition to normal activities in the next 4-6 weeks. The patient agrees that they can manage these recommendations independently, and should not need to return for follow up visits. The patient should follow up with the surgeon with any concerns, and could possibly return to therapy, if needed, with a new order.  The patient will discharge therapy treatment after this visit.     PERFORMANCE DEFICITS: in functional skills including ADLs, IADLs, coordination, dexterity, sensation, edema, ROM, strength, pain, fascial restrictions, flexibility, Fine motor control, body mechanics, endurance, decreased knowledge of precautions, wound, and UE functional use, cognitive skills including problem solving and safety awareness, and psychosocial skills including coping strategies, environmental adaptation, and habits.   IMPAIRMENTS: are limiting patient from ADLs, IADLs, rest and sleep, leisure, and social participation.   COMORBIDITIES: may have co-morbidities  that affects occupational performance. Patient will benefit from skilled OT to address above impairments and improve overall function.  MODIFICATION OR ASSISTANCE TO COMPLETE EVALUATION: No modification of tasks or assist necessary to complete an evaluation.  OT OCCUPATIONAL PROFILE AND HISTORY: Problem focused assessment: Including review of records relating to presenting problem.  CLINICAL DECISION MAKING: LOW - limited treatment options, no task modification necessary  REHAB POTENTIAL: Excellent  EVALUATION COMPLEXITY: Low      PLAN:  OT FREQUENCY: one time visit  OT DURATION: 1 session  PLANNED INTERVENTIONS: self care/ADL training, therapeutic exercise, therapeutic activity, neuromuscular re-education, manual therapy, scar mobilization, passive range of motion, splinting, ultrasound, fluidotherapy, compression bandaging, moist heat, cryotherapy,  contrast bath, patient/family education, energy conservation, coping strategies training, and Re-evaluation  RECOMMENDED OTHER SERVICES: none now   CONSULTED AND AGREED WITH PLAN OF CARE: Patient  PLAN FOR NEXT SESSION:   N/A    Melvenia Ada, OTR/L, CHT 12/22/2023, 11:25 AM

## 2023-12-22 ENCOUNTER — Encounter: Payer: Self-pay | Admitting: Rehabilitative and Restorative Service Providers"

## 2023-12-22 ENCOUNTER — Ambulatory Visit (INDEPENDENT_AMBULATORY_CARE_PROVIDER_SITE_OTHER): Payer: PRIVATE HEALTH INSURANCE | Admitting: Rehabilitative and Restorative Service Providers"

## 2023-12-22 ENCOUNTER — Ambulatory Visit (INDEPENDENT_AMBULATORY_CARE_PROVIDER_SITE_OTHER): Payer: PRIVATE HEALTH INSURANCE | Admitting: Orthopedic Surgery

## 2023-12-22 DIAGNOSIS — M79641 Pain in right hand: Secondary | ICD-10-CM | POA: Diagnosis not present

## 2023-12-22 DIAGNOSIS — R278 Other lack of coordination: Secondary | ICD-10-CM | POA: Diagnosis not present

## 2023-12-22 DIAGNOSIS — R600 Localized edema: Secondary | ICD-10-CM

## 2023-12-22 DIAGNOSIS — M6281 Muscle weakness (generalized): Secondary | ICD-10-CM | POA: Diagnosis not present

## 2023-12-22 DIAGNOSIS — Z9889 Other specified postprocedural states: Secondary | ICD-10-CM

## 2023-12-22 NOTE — Progress Notes (Signed)
   Miranda Huang - 63 y.o. female MRN [id number]  Date of birth: Oct 22, 1960  Office Visit Note: Visit Date: 12/22/2023 PCP: Patient, No Pcp Per Referred by: Marylynn Verneita CROME, MD  Subjective:  HPI: Miranda Huang is a 63 y.o. female who presents today for follow up 2 weeks status post right wrist open carpal tunnel release.  She is doing very well postoperatively, numbness and tingling has resolved entirely.  She is very pleased with her progress  Pertinent ROS were reviewed with the patient and found to be negative unless otherwise specified above in HPI.   Assessment & Plan: Visit Diagnoses:  1. S/P carpal tunnel release     Plan: Sutures removed today.  She will be seen by occupational therapy to begin range of motion exercises for the carpal tunnel.  Follow-up myself in approximate 1 month.  Transition to home exercise program when appropriate.  Follow-up: No follow-ups on file.   Meds & Orders: No orders of the defined types were placed in this encounter.  No orders of the defined types were placed in this encounter.    Procedures: No procedures performed       Objective:   Vital Signs: There were no vitals taken for this visit.  Ortho Exam Right hand: - Well-healing palmar incision, sutures removed, skin edges well-approximated without erythema or drainage - Composite fist without restriction - Sensation intact to light touch in median nerve distribution - 5/5 APB no thenar atrophy   Imaging: No results found.   Jayd Cadieux Afton Alderton, M.D. [facility], Hand Surgery

## 2023-12-23 ENCOUNTER — Encounter (HOSPITAL_BASED_OUTPATIENT_CLINIC_OR_DEPARTMENT_OTHER): Payer: PRIVATE HEALTH INSURANCE | Admitting: Orthopaedic Surgery

## 2023-12-27 ENCOUNTER — Other Ambulatory Visit: Payer: Self-pay

## 2023-12-27 MED ORDER — ESCITALOPRAM OXALATE 10 MG PO TABS
10.0000 mg | ORAL_TABLET | Freq: Every day | ORAL | 1 refills | Status: AC
  Filled 2023-12-27 – 2024-01-06 (×2): qty 30, 30d supply, fill #0
  Filled 2024-02-07: qty 30, 30d supply, fill #1

## 2024-01-02 ENCOUNTER — Encounter: Payer: Self-pay | Admitting: Radiology

## 2024-01-06 ENCOUNTER — Other Ambulatory Visit: Payer: Self-pay

## 2024-01-06 MED ORDER — PREDNISONE 10 MG PO TABS
ORAL_TABLET | ORAL | 0 refills | Status: AC
  Filled 2024-01-06: qty 28, 12d supply, fill #0

## 2024-01-06 MED ORDER — AMOXICILLIN-POT CLAVULANATE 875-125 MG PO TABS
1.0000 | ORAL_TABLET | Freq: Two times a day (BID) | ORAL | 0 refills | Status: AC
  Filled 2024-01-06: qty 20, 10d supply, fill #0

## 2024-01-18 NOTE — Progress Notes (Unsigned)
   ELLON MARASCO - 63 y.o. female MRN [id number]  Date of birth: 1960-06-25  Office Visit Note: Visit Date: 01/19/2024 PCP: Patient, No Pcp Per Referred by: No ref. provider found  Subjective:  HPI: MELYSA SCHROYER is a 63 y.o. female who presents today for follow up 6 weeks status post right wrist open carpal tunnel release.  Pertinent ROS were reviewed with the patient and found to be negative unless otherwise specified above in HPI.   Assessment & Plan: Visit Diagnoses: No diagnosis found.  Plan: ***  Follow-up: No follow-ups on file.   Meds & Orders: No orders of the defined types were placed in this encounter.  No orders of the defined types were placed in this encounter.    Procedures: No procedures performed       Objective:   Vital Signs: There were no vitals taken for this visit.  Ortho Exam ***  Imaging: No results found.   Yoshio Seliga Afton Alderton, M.D. [facility], Hand Surgery

## 2024-01-19 ENCOUNTER — Ambulatory Visit: Payer: PRIVATE HEALTH INSURANCE | Admitting: Orthopedic Surgery

## 2024-01-19 DIAGNOSIS — Z9889 Other specified postprocedural states: Secondary | ICD-10-CM

## 2024-02-07 ENCOUNTER — Other Ambulatory Visit: Payer: Self-pay

## 2024-03-13 ENCOUNTER — Other Ambulatory Visit: Payer: Self-pay

## 2024-03-13 MED ORDER — PROMETHAZINE-DM 6.25-15 MG/5ML PO SYRP
5.0000 mL | ORAL_SOLUTION | Freq: Every evening | ORAL | 0 refills | Status: DC | PRN
  Filled 2024-03-13: qty 50, 10d supply, fill #0

## 2024-03-13 MED ORDER — PREDNISONE 10 MG PO TABS
ORAL_TABLET | ORAL | 0 refills | Status: AC
  Filled 2024-03-13: qty 21, 6d supply, fill #0

## 2024-03-13 MED ORDER — AMOXICILLIN-POT CLAVULANATE 875-125 MG PO TABS
1.0000 | ORAL_TABLET | Freq: Two times a day (BID) | ORAL | 0 refills | Status: AC
  Filled 2024-03-13: qty 20, 10d supply, fill #0

## 2024-03-14 ENCOUNTER — Other Ambulatory Visit: Payer: Self-pay

## 2024-03-14 MED ORDER — ESCITALOPRAM OXALATE 5 MG PO TABS
5.0000 mg | ORAL_TABLET | Freq: Every day | ORAL | 3 refills | Status: AC
  Filled 2024-03-14: qty 30, 30d supply, fill #0

## 2024-03-20 ENCOUNTER — Other Ambulatory Visit: Payer: Self-pay

## 2024-03-20 MED ORDER — PROMETHAZINE-DM 6.25-15 MG/5ML PO SYRP
5.0000 mL | ORAL_SOLUTION | Freq: Every evening | ORAL | 0 refills | Status: AC | PRN
  Filled 2024-03-20: qty 50, 10d supply, fill #0
